# Patient Record
Sex: Male | Born: 1960 | Race: White | Hispanic: No | Marital: Married | Smoking: Current every day smoker
Health system: Southern US, Community
[De-identification: ages and names within clinical notes are randomized; demographics above are authoritative.]

## PROBLEM LIST (undated history)

## (undated) DIAGNOSIS — IMO0002 Reserved for concepts with insufficient information to code with codable children: Secondary | ICD-10-CM

## (undated) DIAGNOSIS — J449 Chronic obstructive pulmonary disease, unspecified: Secondary | ICD-10-CM

## (undated) HISTORY — PX: BACK SURGERY: SHX140

---

## 2002-11-18 ENCOUNTER — Emergency Department (HOSPITAL_COMMUNITY): Admission: EM | Admit: 2002-11-18 | Discharge: 2002-11-18 | Payer: Self-pay | Admitting: Emergency Medicine

## 2005-07-06 ENCOUNTER — Emergency Department: Payer: Self-pay | Admitting: Emergency Medicine

## 2005-11-05 ENCOUNTER — Emergency Department (HOSPITAL_COMMUNITY): Admission: EM | Admit: 2005-11-05 | Discharge: 2005-11-05 | Payer: Self-pay | Admitting: *Deleted

## 2006-04-06 ENCOUNTER — Emergency Department: Payer: Self-pay | Admitting: Emergency Medicine

## 2006-12-29 ENCOUNTER — Emergency Department (HOSPITAL_COMMUNITY): Admission: EM | Admit: 2006-12-29 | Discharge: 2006-12-29 | Payer: Self-pay | Admitting: Emergency Medicine

## 2007-11-04 ENCOUNTER — Encounter: Payer: Self-pay | Admitting: Family Medicine

## 2007-11-04 ENCOUNTER — Emergency Department: Payer: Self-pay | Admitting: Internal Medicine

## 2007-11-09 ENCOUNTER — Emergency Department: Payer: Self-pay | Admitting: Emergency Medicine

## 2007-11-11 ENCOUNTER — Emergency Department: Payer: Self-pay | Admitting: Emergency Medicine

## 2007-11-15 ENCOUNTER — Emergency Department: Payer: Self-pay | Admitting: Emergency Medicine

## 2008-01-05 ENCOUNTER — Emergency Department (HOSPITAL_COMMUNITY): Admission: EM | Admit: 2008-01-05 | Discharge: 2008-01-05 | Payer: Self-pay | Admitting: Emergency Medicine

## 2008-01-18 ENCOUNTER — Emergency Department (HOSPITAL_COMMUNITY): Admission: EM | Admit: 2008-01-18 | Discharge: 2008-01-18 | Payer: Self-pay | Admitting: Emergency Medicine

## 2008-01-24 ENCOUNTER — Emergency Department (HOSPITAL_COMMUNITY): Admission: EM | Admit: 2008-01-24 | Discharge: 2008-01-24 | Payer: Self-pay | Admitting: Emergency Medicine

## 2008-01-27 ENCOUNTER — Emergency Department (HOSPITAL_COMMUNITY): Admission: EM | Admit: 2008-01-27 | Discharge: 2008-01-27 | Payer: Self-pay | Admitting: Emergency Medicine

## 2008-01-28 ENCOUNTER — Ambulatory Visit: Payer: Self-pay | Admitting: Family Medicine

## 2008-01-28 DIAGNOSIS — M1990 Unspecified osteoarthritis, unspecified site: Secondary | ICD-10-CM | POA: Insufficient documentation

## 2008-01-28 DIAGNOSIS — M169 Osteoarthritis of hip, unspecified: Secondary | ICD-10-CM | POA: Insufficient documentation

## 2008-01-28 DIAGNOSIS — M25559 Pain in unspecified hip: Secondary | ICD-10-CM | POA: Insufficient documentation

## 2008-01-28 DIAGNOSIS — M1610 Unilateral primary osteoarthritis, unspecified hip: Secondary | ICD-10-CM | POA: Insufficient documentation

## 2008-02-05 ENCOUNTER — Telehealth (INDEPENDENT_AMBULATORY_CARE_PROVIDER_SITE_OTHER): Payer: Self-pay | Admitting: *Deleted

## 2008-02-05 ENCOUNTER — Emergency Department: Payer: Self-pay | Admitting: Emergency Medicine

## 2008-02-11 ENCOUNTER — Emergency Department: Payer: Self-pay

## 2008-02-11 ENCOUNTER — Emergency Department (HOSPITAL_COMMUNITY): Admission: EM | Admit: 2008-02-11 | Discharge: 2008-02-11 | Payer: Self-pay | Admitting: Emergency Medicine

## 2008-02-11 ENCOUNTER — Telehealth (INDEPENDENT_AMBULATORY_CARE_PROVIDER_SITE_OTHER): Payer: Self-pay | Admitting: *Deleted

## 2008-02-14 ENCOUNTER — Emergency Department (HOSPITAL_COMMUNITY): Admission: EM | Admit: 2008-02-14 | Discharge: 2008-02-14 | Payer: Self-pay | Admitting: Emergency Medicine

## 2008-02-15 ENCOUNTER — Telehealth: Payer: Self-pay | Admitting: Family Medicine

## 2008-02-15 ENCOUNTER — Emergency Department: Payer: Self-pay | Admitting: Emergency Medicine

## 2008-02-22 ENCOUNTER — Emergency Department (HOSPITAL_COMMUNITY): Admission: EM | Admit: 2008-02-22 | Discharge: 2008-02-22 | Payer: Self-pay | Admitting: Emergency Medicine

## 2008-02-29 ENCOUNTER — Emergency Department: Payer: Self-pay | Admitting: Internal Medicine

## 2008-03-21 ENCOUNTER — Emergency Department (HOSPITAL_COMMUNITY): Admission: EM | Admit: 2008-03-21 | Discharge: 2008-03-21 | Payer: Self-pay | Admitting: Emergency Medicine

## 2010-06-14 ENCOUNTER — Ambulatory Visit: Payer: Self-pay | Admitting: Physical Medicine & Rehabilitation

## 2010-06-17 ENCOUNTER — Emergency Department (HOSPITAL_COMMUNITY)
Admission: EM | Admit: 2010-06-17 | Discharge: 2010-06-17 | Disposition: A | Discharge status: Home / Self Care | Payer: Medicaid Other | Attending: Emergency Medicine | Admitting: Emergency Medicine

## 2010-06-17 DIAGNOSIS — M545 Low back pain, unspecified: Secondary | ICD-10-CM | POA: Insufficient documentation

## 2010-06-17 DIAGNOSIS — G8929 Other chronic pain: Secondary | ICD-10-CM | POA: Insufficient documentation

## 2010-06-22 ENCOUNTER — Emergency Department (HOSPITAL_COMMUNITY)
Admission: EM | Admit: 2010-06-22 | Discharge: 2010-06-22 | Disposition: A | Discharge status: Home / Self Care | Payer: Medicaid Other | Attending: Emergency Medicine | Admitting: Emergency Medicine

## 2010-06-22 DIAGNOSIS — M129 Arthropathy, unspecified: Secondary | ICD-10-CM | POA: Insufficient documentation

## 2010-06-22 DIAGNOSIS — M549 Dorsalgia, unspecified: Secondary | ICD-10-CM | POA: Insufficient documentation

## 2010-06-22 DIAGNOSIS — G8929 Other chronic pain: Secondary | ICD-10-CM | POA: Insufficient documentation

## 2010-06-23 ENCOUNTER — Emergency Department (HOSPITAL_COMMUNITY): Admission: EM | Admit: 2010-06-23 | Payer: Self-pay | Source: Home / Self Care

## 2010-07-21 ENCOUNTER — Emergency Department: Payer: Self-pay | Admitting: Emergency Medicine

## 2010-08-04 ENCOUNTER — Ambulatory Visit: Payer: Self-pay | Admitting: Internal Medicine

## 2011-04-24 ENCOUNTER — Encounter (HOSPITAL_COMMUNITY): Payer: Self-pay | Admitting: *Deleted

## 2011-04-24 ENCOUNTER — Emergency Department (HOSPITAL_COMMUNITY)
Admission: EM | Admit: 2011-04-24 | Discharge: 2011-04-24 | Disposition: A | Discharge status: Home / Self Care | Payer: Medicaid Other | Attending: Emergency Medicine | Admitting: Emergency Medicine

## 2011-04-24 DIAGNOSIS — Z760 Encounter for issue of repeat prescription: Secondary | ICD-10-CM | POA: Insufficient documentation

## 2011-04-24 DIAGNOSIS — G8929 Other chronic pain: Secondary | ICD-10-CM | POA: Insufficient documentation

## 2011-04-24 DIAGNOSIS — M549 Dorsalgia, unspecified: Secondary | ICD-10-CM | POA: Insufficient documentation

## 2011-04-24 DIAGNOSIS — F17200 Nicotine dependence, unspecified, uncomplicated: Secondary | ICD-10-CM | POA: Insufficient documentation

## 2011-04-24 HISTORY — DX: Reserved for concepts with insufficient information to code with codable children: IMO0002

## 2011-04-24 MED ORDER — OXYCODONE HCL 20 MG PO TABS: 20.0000 mg | ORAL_TABLET | ORAL | Status: AC | PRN

## 2011-04-24 NOTE — ED Provider Notes (Signed)
History     CSN: [id number]  Arrival date & time 04/24/11  1909   First MD Initiated Contact with Patient 04/24/11 2117      Chief Complaint  Patient presents with  . Medication Refill    (Consider location/radiation/quality/duration/timing/severity/associated sxs/prior treatment) HPI Comments: Patient presents to the emergency department today asking for refill of his 20 mg oxycodone tablets.  Has been taking this for a number of years prescribed by Annitta Needs in [place].  She is currently on her honeymoon and will be returning to the office on January 20.  He does have an appointment for that day to get his routine monthly prescription. Initial database Mr. Hulan Fray has been receiving 200 tablets on a monthly basis for quite some time.  Has 1 provider for his regular prescriptions  The history is provided by the patient.    Past Medical History  Diagnosis Date  . Degenerative disc disease     History reviewed. No pertinent past surgical history.  No family history on file.  History  Substance Use Topics  . Smoking status: Current Everyday Smoker    Types: Cigarettes  . Smokeless tobacco: Not on file  . Alcohol Use: No      Review of Systems  Constitutional: Negative for fever and chills.  Genitourinary: Negative for dysuria.  Musculoskeletal: Positive for back pain.  Neurological: Negative for dizziness, weakness and numbness.    Allergies  Celecoxib; Cyclobenzaprine hcl; and Tramadol  Home Medications   Current Outpatient Rx  Name Route Sig Dispense Refill  . ATENOLOL 25 MG PO TABS Oral Take 25 mg by mouth daily as needed. For high blood pressure    . DIAZEPAM 5 MG PO TABS Oral Take 5 mg by mouth at bedtime as needed. For muscle spasms/sleep    . OXYCODONE HCL ER 20 MG PO TB12 Oral Take 20-30 mg by mouth every 4 (four) hours as needed. For pain    . OXYCODONE HCL 20 MG PO TABS Oral Take 1 tablet (20 mg total) by mouth every 4 (four) hours as needed for  pain (20 milligram tabs 1to 1 and 1/2 tablet every 4 hours ). 30 tablet 0    BP 132/84  Pulse 58  Temp(Src) 98.2 F (36.8 C) (Oral)  Resp 16  SpO2 95%  Physical Exam  Constitutional: He is oriented to person, place, and time. He appears well-developed and well-nourished.  HENT:  Head: Normocephalic.  Neck: Normal range of motion.  Cardiovascular: Normal rate.   Pulmonary/Chest: Effort normal.  Musculoskeletal: Normal range of motion.  Neurological: He is alert and oriented to person, place, and time.  Skin: Skin is warm and dry.  Psychiatric: He has a normal mood and affect.    ED Course  Procedures (including critical care time)  Labs Reviewed - No data to display No results found.   1. Chronic back pain greater than 3 months duration   2. Medication refill       MDM  Chronic back pain, medication refill   Medical screening examination/treatment/procedure(s) were performed by non-physician practitioner and as supervising physician I was immediately available for consultation/collaboration. Osvaldo Human, M.D.      Arman Filter, NP 04/24/11 2134  Arman Filter, NP 04/24/11 2135  Carleene Cooper III, MD 04/26/11 1145

## 2011-04-24 NOTE — ED Notes (Signed)
Pt's MD is out of town and he is out of oxycodone.  He needs refill, he took the last dose today at noon.

## 2011-07-01 ENCOUNTER — Emergency Department: Payer: Self-pay | Admitting: Emergency Medicine

## 2011-07-01 ENCOUNTER — Ambulatory Visit: Payer: Self-pay

## 2011-07-01 ENCOUNTER — Ambulatory Visit (INDEPENDENT_AMBULATORY_CARE_PROVIDER_SITE_OTHER): Payer: Medicare Other | Admitting: Family Medicine

## 2011-07-01 VITALS — BP 175/100 | HR 80 | Temp 98.1°F | Resp 16

## 2011-07-01 DIAGNOSIS — M549 Dorsalgia, unspecified: Secondary | ICD-10-CM

## 2011-07-01 DIAGNOSIS — G8929 Other chronic pain: Secondary | ICD-10-CM

## 2011-07-01 MED ORDER — OXYCODONE HCL 20 MG PO TABS: 20.0000 mg | ORAL_TABLET | ORAL | Status: DC | PRN

## 2011-07-01 NOTE — Progress Notes (Signed)
  Subjective:    Patient ID: Grant Galvan, male    DOB: 1960-06-21, 51 y.o.   MRN: [id number]  HPI 51 yo male here requesting pain medication refill.  Chronic back pain and hip pain.  Was seeing Franchot Gallo but her practice no longer doing pain management.  Recently established with Dr. Mikeal Hawthorne.  Had appt with him this morning.  He had something urgent come up and out of town this week.  Has appt 4/8. Takes oxycodone 20-30 mg q 4 hours as needed.  Given 150 of oxycodone 20's 3/11.  Use fits timeline.  DEA record checked.     Review of Systems Negative except as per HPI     Objective:   Physical Exam  Constitutional: He appears well-developed.  Pulmonary/Chest: Effort normal.  Neurological: He is alert.          Assessment & Plan:  Chronic pain - 8 days of meds given as story all fits with DEA site info.

## 2012-05-05 ENCOUNTER — Emergency Department: Payer: Self-pay | Admitting: Emergency Medicine

## 2013-07-30 ENCOUNTER — Emergency Department: Payer: Self-pay | Admitting: Emergency Medicine

## 2013-09-20 ENCOUNTER — Emergency Department (HOSPITAL_COMMUNITY)
Admission: EM | Admit: 2013-09-20 | Discharge: 2013-09-20 | Disposition: A | Discharge status: Home / Self Care | Payer: PRIVATE HEALTH INSURANCE | Attending: Emergency Medicine | Admitting: Emergency Medicine

## 2013-09-20 ENCOUNTER — Encounter (HOSPITAL_COMMUNITY): Payer: Self-pay | Admitting: Emergency Medicine

## 2013-09-20 DIAGNOSIS — M545 Low back pain, unspecified: Secondary | ICD-10-CM | POA: Insufficient documentation

## 2013-09-20 DIAGNOSIS — Z8739 Personal history of other diseases of the musculoskeletal system and connective tissue: Secondary | ICD-10-CM | POA: Insufficient documentation

## 2013-09-20 DIAGNOSIS — Z760 Encounter for issue of repeat prescription: Secondary | ICD-10-CM

## 2013-09-20 DIAGNOSIS — Z79899 Other long term (current) drug therapy: Secondary | ICD-10-CM | POA: Insufficient documentation

## 2013-09-20 DIAGNOSIS — M549 Dorsalgia, unspecified: Secondary | ICD-10-CM

## 2013-09-20 DIAGNOSIS — F17200 Nicotine dependence, unspecified, uncomplicated: Secondary | ICD-10-CM | POA: Insufficient documentation

## 2013-09-20 DIAGNOSIS — G8929 Other chronic pain: Secondary | ICD-10-CM | POA: Insufficient documentation

## 2013-09-20 MED ORDER — OXYCODONE HCL 20 MG PO TABS: 20.0000 mg | ORAL_TABLET | ORAL | Status: DC | PRN

## 2013-09-20 MED ORDER — OXYCODONE HCL 5 MG PO TABS
20.0000 mg | ORAL_TABLET | Freq: Once | ORAL | Status: AC
  Administered 2013-09-20: 20 mg via ORAL
  Filled 2013-09-20: qty 4

## 2013-09-20 NOTE — ED Notes (Signed)
Pt states that his last dose of medication on Friday. Pt states that his physician is not in the office over the weekend, and was not able to get a refill.

## 2013-09-20 NOTE — ED Notes (Signed)
The pt has chronic back pain and he has been on narcotic pain med for 14 years.  He is out of pain med since Friday and he thinks he is going through withdrawal

## 2013-09-20 NOTE — ED Provider Notes (Signed)
CSN: [id number]     Arrival date & time 09/20/13  0049 History   First MD Initiated Contact with Patient 09/20/13 0151     Chief Complaint  Patient presents with  . Back Pain     HPI Pt was seen at 0215. Per pt, c/o gradual onset and persistence of constant acute flair of his chronic low back "pain" for the past several days.  Denies any change in his usual chronic pain pattern.  Pain worsens with palpation of the area and body position changes. States he ran out of his usual chronic narcotic pain medication 2 days ago and is requesting a refill "to get me through until I can call my regular doctor today." Denies incont/retention of bowel or bladder, no saddle anesthesia, no focal motor weakness, no tingling/numbness in extremities, no fevers, no injury, no abd pain.   The symptoms have been associated with no other complaints. The patient has a significant history of similar symptoms previously, recently being evaluated for this complaint and multiple prior evals for same.     Past Medical History  Diagnosis Date  . Degenerative disc disease    History reviewed. No pertinent past surgical history.  History  Substance Use Topics  . Smoking status: Current Every Day Smoker -- 0.50 packs/day    Types: Cigarettes  . Smokeless tobacco: Not on file  . Alcohol Use: No    Review of Systems ROS: Statement: All systems negative except as marked or noted in the HPI; Constitutional: Negative for fever and chills. ; ; Eyes: Negative for eye pain, redness and discharge. ; ; ENMT: Negative for ear pain, hoarseness, nasal congestion, sinus pressure and sore throat. ; ; Cardiovascular: Negative for chest pain, palpitations, diaphoresis, dyspnea and peripheral edema. ; ; Respiratory: Negative for cough, wheezing and stridor. ; ; Gastrointestinal: Negative for nausea, vomiting, diarrhea, abdominal pain, blood in stool, hematemesis, jaundice and rectal bleeding. . ; ; Genitourinary: Negative for dysuria,  flank pain and hematuria. ; ; Musculoskeletal: +LBP. Negative for neck pain. Negative for swelling and trauma.; ; Skin: Negative for pruritus, rash, abrasions, blisters, bruising and skin lesion.; ; Neuro: Negative for headache, lightheadedness and neck stiffness. Negative for weakness, altered level of consciousness , altered mental status, extremity weakness, paresthesias, involuntary movement, seizure and syncope.        Allergies  Celecoxib; Cyclobenzaprine hcl; and Tramadol  Home Medications   Prior to Admission medications   Medication Sig Start Date End Date Taking? Authorizing Provider  diazepam (VALIUM) 5 MG tablet Take 5 mg by mouth 3 (three) times daily.   Yes Historical Provider, MD  Oxycodone HCl 20 MG TABS Take 1 tablet by mouth every 4 (four) hours as needed (for pain).   Yes Historical Provider, MD   BP 122/88  Pulse 86  Temp(Src) 97.5 F (36.4 C) (Oral)  Resp 22  Ht 6\' 3"  (1.905 m)  Wt 155 lb (70.308 kg)  BMI 19.37 kg/m2  SpO2 99% Physical Exam 0220; Physical examination:  Nursing notes reviewed; Vital signs and O2 SAT reviewed;  Constitutional: Well developed, Well nourished, Well hydrated, In no acute distress; Head:  Normocephalic, atraumatic; Eyes: EOMI, PERRL, No scleral icterus; ENMT: Mouth and pharynx normal, Mucous membranes moist; Neck: Supple, Full range of motion, No lymphadenopathy; Cardiovascular: Regular rate and rhythm, No murmur, rub, or gallop; Respiratory: Breath sounds clear & equal bilaterally, No rales, rhonchi, wheezes.  Speaking full sentences with ease, Normal respiratory effort/excursion; Chest: Nontender, Movement normal; Abdomen: Soft, Nontender,  Nondistended, Normal bowel sounds; Genitourinary: No CVA tenderness; Spine:  No midline CS, TS, LS tenderness. +TTP lumbar paraspinal muscles.;; Extremities: Pulses normal, No tenderness, No edema, No calf edema or asymmetry.; Neuro: AA&Ox3, Major CN grossly intact.  Speech clear. No gross focal motor  deficits in extremities.; Skin: Color normal, Warm, Dry.   ED Course  Procedures     MDM  MDM Reviewed: previous chart, nursing note and vitals    0300:  Long hx of chronic pain with multiple ED visits for same.  Pt endorses acute flair of his usual long standing chronic pain today, no change from his usual chronic pain pattern.  Pt encouraged to f/u with his PMD and Pain Management doctor for good continuity of care and control of his chronic pain.  Verb understanding.     Laray AngerKathleen M McManus, DO 09/22/13 1739

## 2013-09-20 NOTE — Discharge Instructions (Signed)
°Emergency Department Resource Guide °1) Find a Doctor and Pay Out of Pocket °Although you won't have to find out who is covered by your insurance plan, it is a good idea to ask around and get recommendations. You will then need to call the office and see if the doctor you have chosen will accept you as a new patient and what types of options they offer for patients who are self-pay. Some doctors offer discounts or will set up payment plans for their patients who do not have insurance, but you will need to ask so you aren't surprised when you get to your appointment. ° °2) Contact Your Local Health Department °Not all health departments have doctors that can see patients for sick visits, but many do, so it is worth a call to see if yours does. If you don't know where your local health department is, you can check in your phone book. The CDC also has a tool to help you locate your state's health department, and many state websites also have listings of all of their local health departments. ° °3) Find a Walk-in Clinic °If your illness is not likely to be very severe or complicated, you may want to try a walk in clinic. These are popping up all over the country in pharmacies, drugstores, and shopping centers. They're usually staffed by nurse practitioners or physician assistants that have been trained to treat common illnesses and complaints. They're usually fairly quick and inexpensive. However, if you have serious medical issues or chronic medical problems, these are probably not your best option. ° °No Primary Care Doctor: °- Call Health Connect at  832-8000 - they can help you locate a primary care doctor that  accepts your insurance, provides certain services, etc. °- Physician Referral Service- 1-800-533-3463 ° °Chronic Pain Problems: °Organization         Address  Phone   Notes  °[facility]  (336) [phone] Patients need to be referred by their primary care doctor.  ° °Medication  Assistance: °Organization         Address  Phone   Notes  °[place] [facility] [address] °[place], [place] [address] (336) 641-8030 --Must be a resident of [place] °-- Must have NO insurance coverage whatsoever (no Medicaid/ Medicare, etc.) °-- The pt. MUST have a primary care doctor that directs their care regularly and follows them in the community °  °[facility]  (866) 331-1348   °[employer]  (888) 892-1162   ° °Agencies that provide inexpensive medical care: °Organization         Address  Phone   Notes  °[facility]  (336) 832-8035   °[facility]    (336) 832-7272   °[facility] [address] °[place], [place] [address] (336) 832-4777   °[facility] [address], °[place] (336) 271-4999   °[facility]    (336) 373-0678   °[facility]    (336) 272-1050   °[facility] ° [address], [place] Phone:  (336) 832-4444, Fax:  (336) 832-4440 Hours of Operation:  9 am - 6 pm, M-F.  Also accepts Medicaid/Medicare and self-pay.  °[facility] ° [address], [place] Phone: (336) 832-3150, Fax: (336) [phone]. Hours of Operation:  8:30 am - 5:30 pm, M-F.  Also accepts Medicaid and self-pay.  °[facility] [place] [address], [place] Phone: (336) 878-6027   °[facility] [address], [place], [place] (336)723-1848, Ext. 123 Mondays & Thursdays: 7-9 AM.  First 15 patients are seen on a first come, first serve basis. °  ° °Medicaid-accepting [place] Providers: ° °Organization         Address  Phone   Notes  °[facility] [address], [place] (336) 641-2100 Also accepts self-pay patients.  °[facility] [address], [place] ° (336) 856-9996   °[facility] [address], [place]  (336) 288-8857   °[facility] [address] [place] [address], [place] (336) 299-7000   °Veita Bland [address], [place]  ° (336) 373-1557 Only accepts [place] Access Medicaid patients after they have their name applied to their card.  ° °Self-Pay (no insurance) in [place]: ° °Organization         Address  Phone   Notes  °Sickle Cell Patients, [facility] [address], [place] (336) 832-1970   °[facility] [address], [place] (336) 832-4400   °[facility] ° [address], [place] (336) 992-4800   °[facility]/Dr. Osei-Bonsu ° [address] [place] [address], [place] or [address], [place] (336) [phone] Phone number for both [place] and [place] locations is the same.  °[facility] [address], [place] (336) 299-0000   °[facility] [address] [place] [address], [place] or [address] (336) 852-7530 °(336) 878-2260   °[facility] [address], [place] (336) 350-1642, phone; (336) 294-5005, fax Sees patients 1st and 3rd Saturday of every month.  Must not qualify for public or private insurance (i.e. Medicaid, Medicare, [place] Health Choice, Veterans' Benefits) • Household income should be no more than 200% of the poverty level •The clinic cannot treat you if you are pregnant or think you are pregnant • Sexually transmitted diseases are not treated at the clinic.  ° ° °Dental Care: °Organization         Address  Phone  Notes  °[place] [facility] [address], [place] (336) 641-6152 Accepts children up to age 21 who are enrolled in Medicaid or [place] Health Choice; pregnant women with a Medicaid card; and children who have applied for Medicaid or [place] Health Choice, but were declined, whose parents can pay a reduced fee at time of service.  °[place]  [facility] [place]  [address], [place] (336) 641-7733 Accepts children up to age 21 who are enrolled in Medicaid or [place] Health Choice; pregnant women with a Medicaid card; and children who have applied for Medicaid or [place] Health Choice, but were declined, whose parents can pay a reduced fee at time of service.  °[facility] ° [address], [place] (336) 641-4533 Patients are seen by appointment only. Walk-ins are not accepted. [facility] will see patients 18 years of age and older. °Monday - Tuesday (8am-5pm) °Most Wednesdays (8:30-5pm) °$30 per visit, cash only  °[facility] ° [address], [place] (336) 641-4533 Patients are seen by appointment only. Walk-ins are not accepted. [facility] will see patients 18 years of age and older. °One   Wednesday Evening (Monthly: Volunteer Based).  $30 per visit, cash only  °[facility]  (919) 537-3737 for adults; Children under age 4, call Graduate Pediatric Dentistry at (919) [phone]. Children aged 4-14, please call (919) [phone] to request a pediatric application. ° Dental services are provided in all areas of dental care including fillings, crowns and bridges, complete and partial dentures, implants, gum treatment, root canals, and extractions. Preventive care is also provided. Treatment is provided to both adults and children. °Patients are selected via a lottery and there is often a waiting list. °  °[facility] [address], °[place] ° (336) 763-8833 [email or web address] °  °[facility] [address], [place], [place] (336)723-1848, Ext. 123 Second and Fourth Thursday of each month, opens at 6:30 AM; Clinic ends at 9 AM.  Patients are seen on a first-come first-served basis, and a limited number are seen during each clinic.  ° °[facility] ° [address], [place], [place] (336) 723-7904    Eligibility Requirements °You must have lived in [place], [place], or [place] counties for at least the last three months. °  You cannot be eligible for state or federal sponsored healthcare insurance, including Veterans Administration, Medicaid, or Medicare. °  You generally cannot be eligible for healthcare insurance through your employer.  °  How to apply: °Eligibility screenings are held every Tuesday and Wednesday afternoon from 1:00 pm until 4:00 pm. You do not need an appointment for the interview!  °[facility] [address], [place], [place] 336-631-2330   °[facility]  336-342-8273   °[facility]  336-703-3100   °[facility]  336-570-6415   ° °Behavioral Health Resources in the Community: °Intensive Outpatient Programs °Organization         Address  Phone  Notes  °[facility] [address], [place], [place] 336-878-6098   °[facility] [address], [place], [place] 336-832-9800   °[facility] [address], [place], [place] ° 336-882-2125   °[facility] [address],  °[place], [place] 1-800-853-5163 or 336-641-4981   °Substance Abuse Resources °Organization         Address  Phone  Notes  °[facility]  336-882-2125   °[facility]  336-784-9470   °[facility]  336-285-9073   °[facility]  336-845-3988   °Residential & Outpatient Substance Abuse Program  1-800-659-3381   °Psychological Services °Organization         Address  Phone  Notes  °[facility]  336- 832-9600   °[facility]  336- 378-7881   °[facility] [address], [place] 1-800-853-5163 or 336-641-4981   ° °Mobile Crisis Teams °Organization         Address  Phone  Notes  °[facility], Mobile Crisis Care Unit  1-877-626-1772   °Assertive °[facility] ° [address]  [place], [place] 336-834-9664   °Sharon DeEsch [address] °[place] 336-554-5454   ° °Self-Help/Support Groups °Organization         Address  Phone             Notes  °[facility] of  - variety of support groups  336- 373-1402 Call for more information  °Narcotics Anonymous (NA), [facility] [address], °[place]  2 meetings at this location  ° °  Residential Treatment Programs Organization         Address  Phone  Notes  [facility] [address],    [place]  [phone]   [facility]  [address], [place] [id number], [place], [place] [phone]   [facility] [address], [place] [phone]) [phone] Admissions: 8am-3pm M-F  [facility] [address]    [place], [place] [phone]   [facility] [address], [place], [place] [phone]   [facility] [address]  [place], [place] [phone]   [facility] Intensive Outpatient [address], [place], [place] [phone]   [facility] ([facility] [address]  [place], [place] [phone] or (386)[phone](201)[phone]   [facility] ([facility]) [address] [place], [place] [phone] Accepts Medicaid  [facility] [address]  [place] [phone] Substance Abuse/Addiction Treatment   [facility] Organization         Address  Phone  Notes  [facility]  [phone](888) 229-790-9692   Angie FavaJulie Brannon, PhD [address], [address] [place], [place]   [phone](336) [phone] or (702)[phone](336) [phone]   [facility]   [address] [place], [place] [phone](336) [phone]   [facility] [address], [place], [place] [phone](336) [phone] Insurance/Medicaid/sponsorship through [facility]  [facility] [address]                                    [place], [place] [phone](336) [phone] Therapy/tele-psych/case    [facility] [address]   [place], [place] [phone](336) [phone]    Dr. Lolly MustacheArfeen  [phone](336) [phone]   [facility]  [facility] 1) [address], [place] 2) [address], [place] 3)  [address], [place] (681)[phone](336) 443-026-2738 [phone](336) [phone]  [phone](336) [phone]   [facility] Child Abuse Hot[facility] 367-777-1293(336) [phone] or (706)[phone](336) [phone] (After Hours)       Take the prescription as directed.  Call your regular medical doctor today to schedule a follow up appointment within the next 24 hours to refill your chronic pain medication.  Return to the Emergency Department immediately sooner if worsening.

## 2013-10-18 ENCOUNTER — Ambulatory Visit (INDEPENDENT_AMBULATORY_CARE_PROVIDER_SITE_OTHER): Payer: Medicare Other | Admitting: Physician Assistant

## 2013-10-18 VITALS — BP 126/84 | HR 70 | Temp 98.5°F | Resp 18 | Ht 75.5 in | Wt 166.4 lb

## 2013-10-18 DIAGNOSIS — I10 Essential (primary) hypertension: Secondary | ICD-10-CM | POA: Insufficient documentation

## 2013-10-18 DIAGNOSIS — M1990 Unspecified osteoarthritis, unspecified site: Secondary | ICD-10-CM

## 2013-10-18 DIAGNOSIS — Z8679 Personal history of other diseases of the circulatory system: Secondary | ICD-10-CM | POA: Insufficient documentation

## 2013-10-18 MED ORDER — OXYCODONE HCL 20 MG PO TABS: 30.0000 mg | ORAL_TABLET | ORAL | Status: DC | PRN

## 2013-10-18 NOTE — Patient Instructions (Signed)
Proceed with the appointment with Ms. Sampson SiBaity on 8/04 as planned. In the meantime, work on getting the records from your other imaging studies so that we can update your record.
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
## 2013-11-02 ENCOUNTER — Ambulatory Visit (INDEPENDENT_AMBULATORY_CARE_PROVIDER_SITE_OTHER): Payer: Medicare Other | Admitting: Internal Medicine

## 2013-11-02 ENCOUNTER — Ambulatory Visit: Payer: PRIVATE HEALTH INSURANCE | Admitting: Internal Medicine

## 2013-11-02 VITALS — BP 142/96 | HR 78 | Temp 98.0°F | Resp 18 | Ht 75.0 in | Wt 160.0 lb

## 2013-11-02 DIAGNOSIS — Z7189 Other specified counseling: Secondary | ICD-10-CM

## 2013-11-02 DIAGNOSIS — IMO0002 Reserved for concepts with insufficient information to code with codable children: Secondary | ICD-10-CM | POA: Insufficient documentation

## 2013-11-02 DIAGNOSIS — G894 Chronic pain syndrome: Secondary | ICD-10-CM

## 2013-11-02 DIAGNOSIS — R52 Pain, unspecified: Secondary | ICD-10-CM

## 2013-11-02 DIAGNOSIS — Z5189 Encounter for other specified aftercare: Secondary | ICD-10-CM

## 2013-11-02 MED ORDER — OXYCODONE HCL 20 MG PO TABS: 30.0000 mg | ORAL_TABLET | ORAL | Status: DC | PRN

## 2013-11-02 MED ORDER — DIAZEPAM 5 MG PO TABS: 5.0000 mg | ORAL_TABLET | Freq: Three times a day (TID) | ORAL | Status: DC

## 2013-11-02 NOTE — Patient Instructions (Signed)
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
## 2013-11-15 ENCOUNTER — Ambulatory Visit (INDEPENDENT_AMBULATORY_CARE_PROVIDER_SITE_OTHER): Payer: Medicare Other | Admitting: Family Medicine

## 2013-11-15 VITALS — BP 140/88 | HR 64 | Temp 98.2°F | Resp 16 | Ht 75.0 in | Wt 158.8 lb

## 2013-11-15 DIAGNOSIS — G894 Chronic pain syndrome: Secondary | ICD-10-CM

## 2013-11-15 DIAGNOSIS — Z7189 Other specified counseling: Secondary | ICD-10-CM

## 2013-11-15 DIAGNOSIS — IMO0002 Reserved for concepts with insufficient information to code with codable children: Secondary | ICD-10-CM

## 2013-11-15 MED ORDER — OXYCODONE HCL 20 MG PO TABS: 30.0000 mg | ORAL_TABLET | ORAL | Status: DC | PRN

## 2013-11-15 NOTE — Progress Notes (Signed)
[facility] [address], [place] [address] [phone]  Date:  11/15/2013   Name:  Grant Galvan   DOB:  December 10, 1960   MRN:  [id number]  PCP:  Lonia BloodGARBA,LAWAL, MD    Chief Complaint: Medication Refill   History of Present Illness:  Grant Galvan is a 53 y.o. very pleasant male patient who presents with the following:  He is here today to do a follow-up visit.  He had wanted to see [name], but she is not in until 9am and he has another appt to make. He is going to [place] a month for a [employer] sale and needs to leave soon.  He needs refill of his chronic pain medication prior to his trip. He is in the process or getting in to see pain management.   He takes oxycodone 20 1 or 1.5 every 4 hours as needed.  He may use 8 in a day.  He also uses valium 5mg  2 or 3 a day.   He would like to go ahead and arrange an MRI of his "back and hips" and should be home the 2nd week of September to get this done.   He received oxycodone #144 on 11/02/2013.  He was given an rx for valium with a RF at his last visit with Dr. Perrin MalteseGuest- he still has about 10 left so with the RF this should hold him until he gets home.    He brings with him several radiology reports from [facility]. Most notably MRI from 07/2010 shows "mild multilevel degenerative disc and faced disease, with out canal stenosis or neuroforaminal narrowing.  Mild left paracentral disc protrusion at L4-5 approaches the descending left L5 nerve root."   Patient Active Problem List   Diagnosis Date Noted  . Vertebral fracture 11/02/2013  . HTN (hypertension) 10/18/2013  . OSTEOARTHRITIS 01/28/2008  . OSTEOARTHROS UNSPEC GEN/LOC PELV REGION&THIGH 01/28/2008  . HIP PAIN, RIGHT, CHRONIC 01/28/2008    Past Medical History  Diagnosis Date  . Degenerative disc disease     reports back fx x 3.    History reviewed. No pertinent past surgical history.  History  Substance Use Topics  . Smoking status: Current  Every Day Smoker -- 0.50 packs/day    Types: Cigarettes  . Smokeless tobacco: Never Used     Comment: I've been slowing down  . Alcohol Use: No     Comment: gave up alcohol 1995    Family History  Problem Relation Age of Onset  . COPD Father   . Cancer Father 1470    prostate  . Cancer Brother 1157    colon    Allergies  Allergen Reactions  . Celecoxib Other (See Comments)    Stopped breathing  . Cyclobenzaprine Hcl Hives  . Tramadol Diarrhea    Medication list has been reviewed and updated.  Current Outpatient Prescriptions on File Prior to Visit  Medication Sig Dispense Refill  . diazepam (VALIUM) 5 MG tablet Take 1 tablet (5 mg total) by mouth 3 (three) times daily.  60 tablet  1  . hydrochlorothiazide (HYDRODIURIL) 25 MG tablet Take 25 mg by mouth daily.      . Oxycodone HCl 20 MG TABS Take 1.5 tablets (30 mg total) by mouth every 4 (four) hours as needed.  144 tablet  0   No current facility-administered medications on file prior to visit.    Review of Systems:  As per HPI- otherwise negative.  Physical Examination: Filed Vitals:   11/15/13 0829  BP: 140/88  Pulse: 64  Temp: 98.2 F (36.8 C)  Resp: 16   Filed Vitals:   11/15/13 0829  Height: 6\' 3"  (1.905 m)  Weight: 158 lb 12.8 oz (72.031 kg)   Body mass index is 19.85 kg/(m^2). Ideal Body Weight: Weight in (lb) to have BMI = 25: 199.6  GEN: WDWN, NAD, Non-toxic, A & O x 3, slim build HEENT: Atraumatic, Normocephalic. Neck supple. No masses, No LAD. Ears and Nose: No external deformity. CV: RRR, No M/G/R. No JVD. No thrill. No extra heart sounds. PULM: CTA B, no wheezes, crackles, rhonchi. No retractions. No resp. distress. No accessory muscle use. ABD: S, NT, ND, +BS. No rebound. No HSM. EXTR: No c/c/e NEURO Normal gait for pt- uses forearm crutches and a back brace  PSYCH: Normally interactive. Conversant. Not depressed or anxious appearing.  Calm demeanor. ;  Assessment and Plan: Chronic pain  syndrome - Plan: Ambulatory referral to Pain Clinic, Oxycodone HCl 20 MG TABS  Vertebral fracture - Plan: Oxycodone HCl 20 MG TABS  Counseling for physician directive - Plan: Oxycodone HCl 20 MG TABS  Filled oxycodone to last for 30 days.  He is ok on valium.  Did referral to [place] pain clinic.   Will order MRI as he requested  Meds ordered this encounter  Medications  . Oxycodone HCl 20 MG TABS    Sig: Take 1.5 tablets (30 mg total) by mouth every 4 (four) hours as needed.    Dispense:  240 tablet    Refill:  0    This is a 30 day supply.     Signed Abbe Amsterdam, MD

## 2013-11-15 NOTE — Patient Instructions (Signed)
We will set up an MRI for after you get back from your trip.  I gave you enough to take 8 oxycodone daily for 30 days.

## 2013-11-16 ENCOUNTER — Other Ambulatory Visit: Payer: Self-pay | Admitting: Family Medicine

## 2013-12-02 ENCOUNTER — Telehealth: Payer: Self-pay | Admitting: Family Medicine

## 2013-12-02 NOTE — Telephone Encounter (Signed)
Called and LMOM.  I am still glad to order the MRI scans.  However he would have to have 3 separate scans ordered- L spine, and each hip.  This may be quite expensive.  Unsure if he would like to look further prior to ordering so many MRI scans.  Please let me know his preference

## 2013-12-06 ENCOUNTER — Telehealth: Payer: Self-pay | Admitting: Family Medicine

## 2013-12-06 DIAGNOSIS — IMO0002 Reserved for concepts with insufficient information to code with codable children: Secondary | ICD-10-CM

## 2013-12-06 DIAGNOSIS — Z7189 Other specified counseling: Secondary | ICD-10-CM

## 2013-12-06 DIAGNOSIS — G894 Chronic pain syndrome: Secondary | ICD-10-CM

## 2013-12-06 MED ORDER — OXYCODONE HCL 20 MG PO TABS: 30.0000 mg | ORAL_TABLET | ORAL | Status: DC | PRN

## 2013-12-06 NOTE — Telephone Encounter (Signed)
He called back to discuss MRI scans.  At this time he would like to delay until he sees pain management, due to cost of scans.  He would like to have his wife pick up his rx for pain medication to bring to him while he is out of town. This is ok, will post- date as appropriate and leave up front for her to pick up  Meds ordered this encounter  Medications  . Oxycodone HCl 20 MG TABS    Sig: Take 1.5 tablets (30 mg total) by mouth every 4 (four) hours as needed. May fill on 12/14/2013    Dispense:  240 tablet    Refill:  0    This is a 30 day supply.

## 2013-12-25 ENCOUNTER — Other Ambulatory Visit: Payer: Self-pay | Admitting: Internal Medicine

## 2013-12-29 NOTE — Telephone Encounter (Signed)
Patient is leaving for [place] this evening, he states he has been out of Valium for 10 days and needs a refill

## 2013-12-30 NOTE — Telephone Encounter (Signed)
I have done for the patient.

## 2013-12-30 NOTE — Telephone Encounter (Signed)
Notified wife on VM and called pt who is OOT to let him know as well. Verified his appt time.

## 2013-12-30 NOTE — Telephone Encounter (Signed)
Pt called to check status. Dr Perrin MalteseGuest has not been in office and no one has addressed this request yet. Pt stated that he has seen Dr Patsy Lageropland who has agreed to be his PCP and has an appt w/her on 01/10/14. She is not in office today either. Can someone else please RF this since pt is flying into town and leaving again this evening?

## 2014-01-10 ENCOUNTER — Encounter: Payer: Self-pay | Admitting: Family Medicine

## 2014-01-10 ENCOUNTER — Ambulatory Visit (INDEPENDENT_AMBULATORY_CARE_PROVIDER_SITE_OTHER): Payer: Medicare Other | Admitting: Family Medicine

## 2014-01-10 VITALS — BP 148/90 | HR 58 | Temp 98.4°F | Resp 16 | Ht 73.0 in | Wt 156.0 lb

## 2014-01-10 DIAGNOSIS — Z7189 Other specified counseling: Secondary | ICD-10-CM

## 2014-01-10 DIAGNOSIS — G894 Chronic pain syndrome: Secondary | ICD-10-CM

## 2014-01-10 DIAGNOSIS — R030 Elevated blood-pressure reading, without diagnosis of hypertension: Secondary | ICD-10-CM

## 2014-01-10 MED ORDER — DIAZEPAM 5 MG PO TABS: ORAL_TABLET | ORAL | Status: DC

## 2014-01-10 MED ORDER — OXYCODONE HCL 20 MG PO TABS: 30.0000 mg | ORAL_TABLET | ORAL | Status: DC | PRN

## 2014-01-10 NOTE — Progress Notes (Signed)
[facility] [address], [place] [address] [phone]  Date:  01/10/2014   Name:  Grant Galvan   DOB:  1961/02/24   MRN:  [id number]  PCP:  Lonia BloodGARBA,LAWAL, MD    Chief Complaint: Medication Refill   History of Present Illness:  Grant Galvan is a 53 y.o. very pleasant male patient who presents with the following:  He is here today to discuss medications.  He needs his oxycodone refilled, and he is also low on his valium.  He will be seeing his new pain doctor hopefully soon- he is to call next month. He plans to see Dr. Delano MetzFrancisco Naveira with [facility] as soon as he gets to his new office He takes valium 3x a day every day, and oxycodone 20mg  8 a day  He needs a toilet safety rail and shower bench, and would like an open hip protector to use for hip support.   He declines a flu shot- "I do not do needles."   BP Readings from Last 3 Encounters:  01/10/14 148/90  11/15/13 140/88  11/02/13 142/96   BP at home 146/63 this am.  He follows this at home- his BP is usually 140s/50-68.  He uses his HCTZ prn if his BP is higher than usual.    Patient Active Problem List   Diagnosis Date Noted  . Vertebral fracture 11/02/2013  . HTN (hypertension) 10/18/2013  . OSTEOARTHRITIS 01/28/2008  . OSTEOARTHROS UNSPEC GEN/LOC PELV REGION&THIGH 01/28/2008  . HIP PAIN, RIGHT, CHRONIC 01/28/2008    Past Medical History  Diagnosis Date  . Degenerative disc disease     reports back fx x 3.    No past surgical history on file.  History  Substance Use Topics  . Smoking status: Current Every Day Smoker -- 0.50 packs/day    Types: Cigarettes  . Smokeless tobacco: Never Used     Comment: I've been slowing down  . Alcohol Use: No     Comment: gave up alcohol 1995    Family History  Problem Relation Age of Onset  . COPD Father   . Cancer Father 3670    prostate  . Cancer Brother 3857    colon    Allergies  Allergen Reactions  .  Celecoxib Other (See Comments)    Stopped breathing  . Cyclobenzaprine Hcl Hives  . Tramadol Diarrhea    Medication list has been reviewed and updated.  Current Outpatient Prescriptions on File Prior to Visit  Medication Sig Dispense Refill  . diazepam (VALIUM) 5 MG tablet TAKE 1 TABLET BY MOUTH 3 TIMES A DAY  60 tablet  0  . Oxycodone HCl 20 MG TABS Take 1.5 tablets (30 mg total) by mouth every 4 (four) hours as needed. May fill on 12/14/2013  240 tablet  0  . hydrochlorothiazide (HYDRODIURIL) 25 MG tablet Take 25 mg by mouth daily.       No current facility-administered medications on file prior to visit.    Review of Systems:  As per HPI- otherwise negative.   Physical Examination: Filed Vitals:   01/10/14 1023  BP: 148/90  Pulse: 58  Temp: 98.4 F (36.9 C)  Resp: 16   Filed Vitals:   01/10/14 1023  Height: 6\' 1"  (1.854 m)  Weight: 156 lb (70.761 kg)   Body mass index is 20.59 kg/(m^2). Ideal Body Weight: Weight in (lb) to have BMI = 25: 189.1  GEN: WDWN, NAD, Non-toxic, A & O  x 3, slim build HEENT: Atraumatic, Normocephalic. Neck supple. No masses, No LAD. Ears and Nose: No external deformity. CV: RRR, No M/G/R. No JVD. No thrill. No extra heart sounds. PULM: CTA B, no wheezes, crackles, rhonchi. No retractions. No resp. distress. No accessory muscle use. ABD: S, NT, ND, +BS. No rebound. No HSM. XTR: No c/c/e NEURO Normal gait for pt, uses elbow crutches as above.   PSYCH: Normally interactive. Conversant. Not depressed or anxious appearing.  Calm demeanor.  Uses bilateral elbow crutches and a back brace  Assessment and Plan: Chronic pain syndrome - Plan: diazepam (VALIUM) 5 MG tablet, Oxycodone HCl 20 MG TABS  Counseling for physician directive  Borderline systolic HTN  States that he has "white coat HTN," and he follows his BP at home, using HCTZ on a prn basis Did refills today for him He will see pain management as soon as he is able to arrange this;  understands that I cannot write for his oxyconde long term  Signed Abbe AmsterdamJessica Jerrie Schussler, MD

## 2014-01-10 NOTE — Patient Instructions (Signed)
Please do go ahead and schedule with your new pain doctor.  Take care, always good to see you

## 2014-02-03 ENCOUNTER — Telehealth: Payer: Self-pay

## 2014-02-03 DIAGNOSIS — G894 Chronic pain syndrome: Secondary | ICD-10-CM

## 2014-02-03 NOTE — Telephone Encounter (Signed)
[facility] - Pt needs a refill on his oxycodone.  He thinks it is due to be filled on Monday and would like for his wife to be able to come by and pick up the prescription before then.  He is out of town in [place] due to his job and will be back on Monday only for a short time.  He then has to leave for [place].  He also wanted you to know that the pain clinic doctor is going into his own private practice and that he has an appointment set with him for January. [phone]

## 2014-02-03 NOTE — Telephone Encounter (Signed)
[place] - patient needs a refill on his oxycodone.  He says he thinks it is due to be filled Monday.  He is out of town in [place] due to his job.  He wants his wife to be able to come pick it up for him some time before Monday.  He will be coming in from his trip Monday only for a short time, then has to leave for [place] the same day.  He also wanted you to know that the pain clinic doctor is going into private practice and he has an appointment with him in January.   [phone]

## 2014-02-04 MED ORDER — OXYCODONE HCL 20 MG PO TABS: 30.0000 mg | ORAL_TABLET | ORAL | Status: DC | PRN

## 2014-02-04 NOTE — Telephone Encounter (Signed)
Did refill and will leave up front for him.  [facility]

## 2014-02-16 ENCOUNTER — Other Ambulatory Visit: Payer: Self-pay | Admitting: Family Medicine

## 2014-03-07 ENCOUNTER — Other Ambulatory Visit: Payer: Self-pay | Admitting: Family Medicine

## 2014-03-07 DIAGNOSIS — G894 Chronic pain syndrome: Secondary | ICD-10-CM

## 2014-03-07 MED ORDER — OXYCODONE HCL 20 MG PO TABS: 30.0000 mg | ORAL_TABLET | ORAL | Status: DC | PRN

## 2014-03-31 ENCOUNTER — Telehealth: Payer: Self-pay

## 2014-03-31 NOTE — Telephone Encounter (Signed)
Error

## 2014-04-04 ENCOUNTER — Encounter: Payer: Self-pay | Admitting: Family Medicine

## 2014-04-04 ENCOUNTER — Ambulatory Visit (INDEPENDENT_AMBULATORY_CARE_PROVIDER_SITE_OTHER): Payer: Medicare Other | Admitting: Family Medicine

## 2014-04-04 VITALS — BP 160/100 | HR 88 | Temp 97.9°F | Resp 16 | Ht 73.5 in | Wt 157.0 lb

## 2014-04-04 DIAGNOSIS — G894 Chronic pain syndrome: Secondary | ICD-10-CM

## 2014-04-04 DIAGNOSIS — I10 Essential (primary) hypertension: Secondary | ICD-10-CM

## 2014-04-04 MED ORDER — DIAZEPAM 5 MG PO TABS: 5.0000 mg | ORAL_TABLET | Freq: Three times a day (TID) | ORAL | Status: DC

## 2014-04-04 MED ORDER — HYDROCHLOROTHIAZIDE 25 MG PO TABS: 25.0000 mg | ORAL_TABLET | Freq: Every day | ORAL | Status: DC

## 2014-04-04 MED ORDER — OXYCODONE HCL 20 MG PO TABS: 30.0000 mg | ORAL_TABLET | ORAL | Status: DC | PRN
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
## 2014-04-04 NOTE — Patient Instructions (Signed)
Good to see you today- best of luck with your new pain doctor.  We should see you for a physical exam and labs at your convenience over the next few months.

## 2014-04-26 ENCOUNTER — Telehealth: Payer: Self-pay

## 2014-04-26 DIAGNOSIS — G894 Chronic pain syndrome: Secondary | ICD-10-CM

## 2014-04-26 NOTE — Telephone Encounter (Signed)
Pt states he is out of town and wants to know if dr copland can refill oxycodone and have his wife to come by and pick up rx

## 2014-04-27 ENCOUNTER — Telehealth: Payer: Self-pay | Admitting: *Deleted

## 2014-04-27 MED ORDER — OXYCODONE HCL 20 MG PO TABS: 30.0000 mg | ORAL_TABLET | ORAL | Status: DC | PRN

## 2014-04-27 NOTE — Telephone Encounter (Signed)
Refill for Oxycodone.  Wants to verify wife Agustin Cree(Darlene Castrogiovanni) can pick up prescription on Monday before leaving town.  Request made by Blanchard KelchWilliam Hayden.

## 2014-04-27 NOTE — Telephone Encounter (Signed)
Did RF for pt, will ask staff to call and let him know and place in pick up box  Meds ordered this encounter  Medications  . Oxycodone HCl 20 MG TABS    Sig: Take 1.5 tablets (30 mg total) by mouth every 4 (four) hours as needed. Ok to fill on 05/02/2014    Dispense:  240 tablet    Refill:  0    This is a 30 day supply.

## 2014-04-28 NOTE — Telephone Encounter (Signed)
Pt has already picked up

## 2014-05-02 ENCOUNTER — Ambulatory Visit: Payer: Medicare Other | Admitting: Family Medicine

## 2014-05-26 ENCOUNTER — Other Ambulatory Visit: Payer: Self-pay | Admitting: Family Medicine

## 2014-05-26 ENCOUNTER — Telehealth: Payer: Self-pay

## 2014-05-26 DIAGNOSIS — G894 Chronic pain syndrome: Secondary | ICD-10-CM

## 2014-05-26 MED ORDER — DIAZEPAM 5 MG PO TABS: 5.0000 mg | ORAL_TABLET | Freq: Three times a day (TID) | ORAL | Status: DC

## 2014-05-26 MED ORDER — OXYCODONE HCL 20 MG PO TABS: 30.0000 mg | ORAL_TABLET | ORAL | Status: DC | PRN

## 2014-05-26 NOTE — Progress Notes (Signed)
Called him and let him know that rx are ready  Meds ordered this encounter  Medications  . diazepam (VALIUM) 5 MG tablet    Sig: Take 1 tablet (5 mg total) by mouth 3 (three) times daily.    Dispense:  90 tablet    Refill:  1    Not to exceed 5 additional fills before 07/09/2014  . Oxycodone HCl 20 MG TABS    Sig: Take 1.5 tablets (30 mg total) by mouth every 4 (four) hours as needed. Ok to fill on 05/30/2014    Dispense:  240 tablet    Refill:  0    This is a 30 day supply.

## 2014-05-26 NOTE — Telephone Encounter (Signed)
Patient is requesting his wife be able to pick up his oxycodone early.  [place] [employer] [place] and will be there through mid march.   [phone]

## 2014-06-22 ENCOUNTER — Telehealth: Payer: Self-pay

## 2014-06-22 DIAGNOSIS — G894 Chronic pain syndrome: Secondary | ICD-10-CM

## 2014-06-22 NOTE — Telephone Encounter (Signed)
Patient is calling to request a refill for oxycodone. His wife will pick up prescription

## 2014-06-23 MED ORDER — OXYCODONE HCL 20 MG PO TABS: 30.0000 mg | ORAL_TABLET | ORAL | Status: DC | PRN

## 2014-06-23 NOTE — Telephone Encounter (Signed)
Did his RF and will put in pick up drawer. LMOM- Asked him to come and see me in the next 2-3 months.  Had to hand- write rx as my computer will not print rx again.

## 2014-07-14 ENCOUNTER — Encounter: Payer: Self-pay | Admitting: *Deleted

## 2014-07-20 ENCOUNTER — Telehealth: Payer: Self-pay

## 2014-07-20 DIAGNOSIS — G894 Chronic pain syndrome: Secondary | ICD-10-CM

## 2014-07-20 NOTE — Telephone Encounter (Signed)
Pt requesting refill on Oxycodone HCl 20 MG TABS, he states that he would like for his wife to pick this up for him this Friday. Please advise

## 2014-07-21 MED ORDER — OXYCODONE HCL 20 MG PO TABS: 30.0000 mg | ORAL_TABLET | ORAL | Status: DC | PRN

## 2014-07-21 NOTE — Telephone Encounter (Signed)
Given last rx to fill on 3/28.  Reviewed controlled substance database and did not find this pt; likely because he is usually having rx filled in another state. Will ask my assistant to call and let him know  Meds ordered this encounter  Medications  . Oxycodone HCl 20 MG TABS    Sig: Take 1.5 tablets (30 mg total) by mouth every 4 (four) hours as needed. Ok to fill on 07/27/2014    Dispense:  240 tablet    Refill:  0    This is a 30 day supply.

## 2014-07-25 ENCOUNTER — Telehealth: Payer: Self-pay | Admitting: *Deleted

## 2014-07-25 NOTE — Telephone Encounter (Signed)
Wife phoned Friday, April 22nd & clarified that Dr. Shanda BumpsJessica Copland is patient's PCP.  Returned her call this morning, per her request, to confirm that PCP has been updated in EMR to reflect Dr. Patsy Lageropland as PCP.

## 2014-08-01 ENCOUNTER — Ambulatory Visit: Payer: Medicare Other | Admitting: Family Medicine

## 2014-08-18 ENCOUNTER — Other Ambulatory Visit: Payer: Self-pay | Admitting: Family Medicine

## 2014-08-19 ENCOUNTER — Telehealth: Payer: Self-pay

## 2014-08-19 DIAGNOSIS — G894 Chronic pain syndrome: Secondary | ICD-10-CM

## 2014-08-19 NOTE — Telephone Encounter (Signed)
Please let him know that this is ok

## 2014-08-19 NOTE — Telephone Encounter (Signed)
Patient of Dr. Patsy Lageropland. He needs a refill on his oxycodone and wants to know if his wife can pick it up on Monday when Dr. Patsy Lageropland is back in the office. Patient is out of town and [place] will [place] in [place] for [place] own appt and wants to come pick it up. He says Dr. Patsy Lageropland has allowed his wife to pick up scripts in the past. Cb# [phone].

## 2014-08-19 NOTE — Telephone Encounter (Signed)
Pt.notified

## 2014-08-22 MED ORDER — OXYCODONE HCL 20 MG PO TABS: 30.0000 mg | ORAL_TABLET | ORAL | Status: DC | PRN

## 2014-08-22 NOTE — Telephone Encounter (Signed)
-----   Message from Pearline CablesJessica C Analucia Hush, MD sent at 08/19/2014  2:44 PM EDT ----- Needs rf

## 2014-08-24 ENCOUNTER — Other Ambulatory Visit: Payer: Self-pay

## 2014-08-24 DIAGNOSIS — G894 Chronic pain syndrome: Secondary | ICD-10-CM

## 2014-08-24 NOTE — Telephone Encounter (Signed)
Patient is calling in regards to medication refill. Although the medication was printed it was no where to be found. Patient states that he got a last minute call to go out of the country to buy cattle and doesn't want to miss the opportunity. He has to be at the airport by 12 today and would like to know if he could the get the oxycodone refill for today's date. Patient states that he is sending his wife here right now. I insisted that they wait for a response and he stated that it will take her an hour to get here. Please call patient! [phone]

## 2014-08-24 NOTE — Telephone Encounter (Signed)
Clarified w/wife that she picked up the Rx on Monday, that is not the problem. Pt just needs to get it filled today, instead of tomorrow as written on orig Rx. I was able to call in authorization (after getting Dr Cyndie Chimeopland's OK) to fill today over the phone and pharm is getting Rx ready for p/up. Advised pt's wife who was in lobby and she thanked us.

## 2014-09-12 ENCOUNTER — Encounter: Payer: Self-pay | Admitting: Family Medicine

## 2014-09-12 ENCOUNTER — Ambulatory Visit (INDEPENDENT_AMBULATORY_CARE_PROVIDER_SITE_OTHER): Payer: Medicare Other | Admitting: Family Medicine

## 2014-09-12 VITALS — BP 144/92 | HR 60 | Temp 98.1°F | Resp 16 | Ht 75.0 in | Wt 154.0 lb

## 2014-09-12 DIAGNOSIS — G894 Chronic pain syndrome: Secondary | ICD-10-CM

## 2014-09-12 DIAGNOSIS — F40298 Other specified phobia: Secondary | ICD-10-CM | POA: Insufficient documentation

## 2014-09-12 MED ORDER — OXYCODONE HCL 20 MG PO TABS: 30.0000 mg | ORAL_TABLET | ORAL | Status: DC | PRN

## 2014-09-12 MED ORDER — DIAZEPAM 5 MG PO TABS: 5.0000 mg | ORAL_TABLET | Freq: Three times a day (TID) | ORAL | Status: DC

## 2014-09-12 NOTE — Patient Instructions (Signed)
Good to see you today!  Please come and see me face to face in 3 months.   We are working on setting up a urine drug screening protocol (which is recommended for all doctors who treat patients with controlled substances).   We can plan to do this for you the next time you are seen, and will do this every 6-12 months.  This will be for EVERYONE and is nothing personal in regards to yourself!
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
## 2014-09-16 ENCOUNTER — Telehealth: Payer: Self-pay

## 2014-09-16 NOTE — Telephone Encounter (Signed)
Patient is calling for Dr. Patsy Lager. He states that his best friend's son is a psychiatric patient and the place in [place] where he currently gets treated isn't working out well. He would like to know what Dr. Patsy Lager would suggest. He states that his best friend's son is borderline autistic. Please call and advise! [phone]

## 2014-09-16 NOTE — Telephone Encounter (Signed)
I would suggest [facility] or the office of Dr. Emerson Monte. Will ask my assistant to call about this issue

## 2014-09-22 ENCOUNTER — Telehealth: Payer: Self-pay

## 2014-09-22 NOTE — Telephone Encounter (Signed)
[facility] called about the same thing. Please advise.

## 2014-09-22 NOTE — Telephone Encounter (Signed)
I gave him an rx for a month's supply of oxycodone to be filled on 6/24 on 6/13- called and [facility] for clarification. He called back and let me know that he needs to fill the rx today instead of tomorrow- he did not lose rx. [facility] and [facility] to RF a day early

## 2014-09-22 NOTE — Telephone Encounter (Signed)
Patient is flying out to [place] today and wants to know if his wife can pick up his oxycodine RX tomorrow morning at 8 am  Called three times this morning.  [phone]

## 2014-10-05 NOTE — Telephone Encounter (Signed)
Error

## 2014-10-13 ENCOUNTER — Telehealth: Payer: Self-pay

## 2014-10-13 DIAGNOSIS — G894 Chronic pain syndrome: Secondary | ICD-10-CM

## 2014-10-13 NOTE — Telephone Encounter (Signed)
Patient is calling because [facility]. They requested that he get his oxycodone printed a week early so his wife can take it to the pharmacy tomorrow. Please call when ready for pick up! [phone]

## 2014-10-14 ENCOUNTER — Other Ambulatory Visit: Payer: Self-pay | Admitting: Family Medicine

## 2014-10-14 DIAGNOSIS — G894 Chronic pain syndrome: Secondary | ICD-10-CM

## 2014-10-14 MED ORDER — OXYCODONE HCL 20 MG PO TABS: 30.0000 mg | ORAL_TABLET | ORAL | Status: DC | PRN

## 2014-10-14 NOTE — Telephone Encounter (Signed)
[facility] and [facility]- will go by the office later on this afternoon and will sign rx for him

## 2014-10-21 ENCOUNTER — Telehealth: Payer: Self-pay

## 2014-10-21 NOTE — Telephone Encounter (Signed)
Pharmacist called to see if we could refill his Oxycodone a day early. Pt is going out of town today and may have to cancel his reservation if he cannot get his medication. Please advise. Call Nash at [phone]. She stated pt never request for early refill.

## 2014-10-21 NOTE — Telephone Encounter (Signed)
Spoke with Angelica Chessman, let her know that patient has been compliant with oxycodone regimen Dr. Patsy Lager has prescribed. Patient is traveling out of town and requesting just one day early. Mandy agreed to refill patient's oxycodone one day early.

## 2014-10-21 NOTE — Telephone Encounter (Signed)
Pt called wanting a refill on Oxycodone HCl 20 MG TABS [[id number]] . If this it gets approved [facility] at [phone]. He is traveling out of town.

## 2014-11-14 ENCOUNTER — Telehealth: Payer: Self-pay

## 2014-11-14 ENCOUNTER — Other Ambulatory Visit: Payer: Self-pay | Admitting: Family Medicine

## 2014-11-14 DIAGNOSIS — G894 Chronic pain syndrome: Secondary | ICD-10-CM

## 2014-11-14 MED ORDER — OXYCODONE HCL 20 MG PO TABS: 30.0000 mg | ORAL_TABLET | ORAL | Status: DC | PRN

## 2014-11-14 NOTE — Telephone Encounter (Signed)
Called and LMOM- I do not feel comfortable going up higher on his dose as he is already on a very high dose of narcotics.  However I will write a RF of his current dose to fill when it is due (too early now).  If he would like me to refer him to a pain specialist I am glad to do so

## 2014-11-14 NOTE — Telephone Encounter (Signed)
Pt needs a refill on his pain meds and to change from taking 1-1 1/2 every four hours to 2 every 4 hours  Best number [phone]

## 2014-12-12 ENCOUNTER — Ambulatory Visit: Payer: Medicare Other | Admitting: Family Medicine

## 2014-12-12 ENCOUNTER — Other Ambulatory Visit: Payer: Self-pay | Admitting: Family Medicine

## 2014-12-14 ENCOUNTER — Other Ambulatory Visit: Payer: Self-pay

## 2014-12-14 ENCOUNTER — Ambulatory Visit: Payer: Medicare Other | Admitting: Family Medicine

## 2014-12-14 DIAGNOSIS — G894 Chronic pain syndrome: Secondary | ICD-10-CM
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
## 2014-12-15 NOTE — Telephone Encounter (Signed)
Called him back and [facility] - sorry for the delay, I received message last night.  I am out of the office today but will have it ready for pick up tomorrow, can't be filled for a few days anyway

## 2014-12-16 MED ORDER — OXYCODONE HCL 20 MG PO TABS: 30.0000 mg | ORAL_TABLET | ORAL | Status: DC | PRN

## 2015-01-11 ENCOUNTER — Encounter: Payer: Self-pay | Admitting: Family Medicine

## 2015-01-11 ENCOUNTER — Ambulatory Visit (INDEPENDENT_AMBULATORY_CARE_PROVIDER_SITE_OTHER): Payer: Medicare Other | Admitting: Family Medicine

## 2015-01-11 VITALS — BP 158/97 | HR 69 | Temp 97.7°F | Resp 16 | Wt 163.0 lb

## 2015-01-11 DIAGNOSIS — Z5181 Encounter for therapeutic drug level monitoring: Secondary | ICD-10-CM

## 2015-01-11 DIAGNOSIS — G894 Chronic pain syndrome: Secondary | ICD-10-CM | POA: Diagnosis not present

## 2015-01-11 MED ORDER — DIAZEPAM 5 MG PO TABS: 5.0000 mg | ORAL_TABLET | Freq: Three times a day (TID) | ORAL | Status: DC

## 2015-01-11 MED ORDER — OXYCODONE HCL 20 MG PO TABS: 30.0000 mg | ORAL_TABLET | ORAL | Status: DC | PRN

## 2015-01-11 NOTE — Patient Instructions (Signed)
It was good to see you today!  Please see me in 3-4 months for a recheck

## 2015-01-11 NOTE — Progress Notes (Signed)
[facility] [address], [place] [address] [phone]  Date:  01/11/2015   Name:  Grant Galvan   DOB:  07-Feb-1961   MRN:  [id number]  PCP:  Abbe Amsterdam, MD    Chief Complaint: Medication Refill   History of Present Illness:  Grant Galvan is a 54 y.o. very pleasant male patient who presents with the following:  History of chronic pain due to injuries and OA mostly in his spine and right hip. We have been treating his chronic pain for over a year now without any concerns as far as compliance.  He is on a large dose of oxycodone but this is stable.   Reviewed controlled substance database which shows rx for 30 days supply of oxycodone dispensed 9/19, and 30 day supply of his valium dispensed on 9/12.  However he reports that he just had valium filled again, and does need more for next month.  Rx for a 30 day supply with one RF on 9/12  He just left [place] last night- he was working in his cattle business.  Drove all night to get to [place] He watches his home BP- generally checks BID.  He may run 153/60-62. He uses the HCTZ when it runs higher at home but reports he has not taken this in months.  He does not wish to treat his BP at its current level Per his ususual habit he declines any immunizations or BW as he cannot tolerate needles He is happy to do a UDS today  Patient Active Problem List   Diagnosis Date Noted  . 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 Vertebral fracture 11/02/2013  . HTN (hypertension) 10/18/2013  . OSTEOARTHRITIS 01/28/2008  . OSTEOARTHROS UNSPEC GEN/LOC PELV REGION&THIGH 01/28/2008  . HIP PAIN, RIGHT, CHRONIC 01/28/2008    Past Medical History  Diagnosis Date  . Degenerative disc disease     reports back fx x 3.    No past surgical history on file.  Social History  Substance Use Topics  . Smoking status: Current Every Day Smoker -- 0.50 packs/day    Types: Cigarettes  . Smokeless tobacco: Never Used     Comment: I've been  slowing down  . Alcohol Use: No     Comment: gave up alcohol 1995    Family History  Problem Relation Age of Onset  . COPD Father   . Cancer Father 32    prostate  . Cancer Brother 32    colon    Allergies  Allergen Reactions  . Celecoxib Other (See Comments)    Stopped breathing  . Cyclobenzaprine Hcl Hives  . Tramadol Diarrhea    Medication list has been reviewed and updated.  Current Outpatient Prescriptions on File Prior to Visit  Medication Sig Dispense Refill  . diazepam (VALIUM) 5 MG tablet TAKE 1 TABLET BY MOUTH 3 TIMES DAILY 90 tablet 1  . hydrochlorothiazide (HYDRODIURIL) 25 MG tablet Take 1 tablet (25 mg total) by mouth daily. 30 tablet 6  . Oxycodone HCl 20 MG TABS Take 1.5 tablets (30 mg total) by mouth every 4 (four) hours as needed. Ok to fill on 12/19/2014 240 tablet 0   No current facility-administered medications on file prior to visit.    Review of Systems:  As per HPI- otherwise negative.   Physical Examination: Filed Vitals:   01/11/15 0834  BP: 158/97  Pulse: 69  Temp: 97.7 F (36.5 C)  Resp: 16   Filed Vitals:  01/11/15 0834  Weight: 163 lb (73.936 kg)   Body mass index is 20.37 kg/(m^2). Ideal Body Weight:    GEN: WDWN, NAD, Non-toxic, A & O x 3, tall, slim build HEENT: Atraumatic, Normocephalic. Neck supple. No masses, No LAD. Ears and Nose: No external deformity. CV: RRR, No M/G/R. No JVD. No thrill. No extra heart sounds. PULM: CTA B, no wheezes, crackles, rhonchi. No retractions. No resp. distress. No accessory muscle use. ABD: S, NT, ND, +BS. No rebound. No HSM. EXTR: No c/c/e NEURO Normal gait for pt- stiff, uses a cane PSYCH: Normally interactive. Conversant. Not depressed or anxious appearing.  Calm demeanor.    Assessment and Plan: Chronic pain syndrome - Plan: Prescript Monitor Profile (9), Oxycodone HCl 20 MG TABS, diazepam (VALIUM) 5 MG tablet  Medication monitoring encounter - Plan: Prescript Monitor Profile  (9)  Paper rx for back support brace for him today, refilled oxycodone and valium for him Await UDS but do not anticipate any problems  Plan follow-up in 3-4 months With labs will encourage him to use his BP medication on a more regular basis  Signed Abbe AmsterdamJessica Copland, MD

## 2015-01-14 ENCOUNTER — Telehealth: Payer: Self-pay

## 2015-01-14 NOTE — Telephone Encounter (Signed)
Pt wants to pick up pain meds on Monday and will not be going out of town because he thought it would be ready for the weekend so he could go out of town.  Please advise [phone]

## 2015-01-14 NOTE — Telephone Encounter (Signed)
Dr. Patsy Lageropland gave him a prescription when he was here on 10/12 to fill on 10/17.  Left him that message.

## 2015-01-14 NOTE — Telephone Encounter (Signed)
Pt is getting ready to leave on a run with his job and will be out of his pain meds on Monday and would like us to call in a refill request  So he can take it with him before he leaves   Best number [phone]

## 2015-01-15 LAB — OXYCODONE, URINE (LC/MS-MS)
NOROXYCODONE, UR: 1228 ng/mL — AB (ref <50)
OXYCODONE, UR: 740 ng/mL — AB (ref <50)
Oxymorphone: 411 ng/mL — AB (ref <50)

## 2015-01-15 LAB — BENZODIAZEPINES (GC/LC/MS), URINE
Alprazolam metabolite (GC/LC/MS), ur confirm: NEGATIVE ng/mL (ref <25)
Clonazepam metabolite (GC/LC/MS), ur confirm: NEGATIVE ng/mL (ref <25)
Flurazepam metabolite (GC/LC/MS), ur confirm: NEGATIVE ng/mL (ref <50)
LORAZEPAMU: NEGATIVE ng/mL (ref <50)
Midazolam (GC/LC/MS), ur confirm: NEGATIVE ng/mL (ref <50)
NORDIAZEPAMU: 307 ng/mL — AB (ref <50)
OXAZEPAMU: 355 ng/mL — AB (ref <50)
TEMAZEPAMU: 497 ng/mL — AB (ref <50)
TRIAZOLAMU: NEGATIVE ng/mL (ref <50)

## 2015-01-17 LAB — PRESCRIPTION MONITORING PROFILE (9 PANEL)
AMPHETAMINE/METH: NEGATIVE ng/mL
BARBITURATE SCREEN, URINE: NEGATIVE ng/mL
CANNABINOID SCRN UR: NEGATIVE ng/mL
COCAINE METABOLITES: NEGATIVE ng/mL
CREATININE, URINE: 21.82 mg/dL (ref >20.0)
METHADONE SCREEN, URINE: NEGATIVE ng/mL
Nitrites, Initial: NEGATIVE ug/mL
Opiate Screen, Urine: NEGATIVE ng/mL
Propoxyphene: NEGATIVE ng/mL
pH, Initial: 7.5 pH (ref 4.5–8.9)

## 2015-02-01 ENCOUNTER — Telehealth: Payer: Self-pay

## 2015-02-01 DIAGNOSIS — G894 Chronic pain syndrome: Secondary | ICD-10-CM

## 2015-02-01 NOTE — Telephone Encounter (Signed)
Pt is needing to get his refill on his pain medication and his wife will pick up the written rx on Friday afternoon

## 2015-02-02 MED ORDER — OXYCODONE HCL 20 MG PO TABS: 30.0000 mg | ORAL_TABLET | ORAL | Status: DC | PRN

## 2015-02-02 NOTE — Telephone Encounter (Signed)
Did RF and will leave up front for him. LMOM

## 2015-02-13 ENCOUNTER — Telehealth: Payer: Self-pay

## 2015-02-13 NOTE — Telephone Encounter (Signed)
[facility] and spoke to [facility]- he is not able to legally fill this until tomorrow (would still be 2 days prior to when he should run out). However he could take rx and have [facility].  Called and LMOM with this information

## 2015-02-13 NOTE — Telephone Encounter (Signed)
Pt needs to know if he can get script. 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 Please advise at [phone]

## 2015-02-13 NOTE — Telephone Encounter (Signed)
Pt states he isn't to get the refill on his HYDROCODONE HCI 20 MG until tomorrow, however he is having to take a load on his truck to [place] today and really would like to have his meds done Today. Please call [phone]    [facility] [address]

## 2015-03-09 ENCOUNTER — Telehealth: Payer: Self-pay

## 2015-03-09 DIAGNOSIS — G894 Chronic pain syndrome: Secondary | ICD-10-CM

## 2015-03-09 NOTE — Telephone Encounter (Signed)
Left voicemail, letting pt know.

## 2015-03-09 NOTE — Telephone Encounter (Signed)
Pt is needing a refill on his oxycodone so his wife can pick it up Monday   Best number [phone]

## 2015-03-09 NOTE — Telephone Encounter (Signed)
Left message on answering machine with note from Dr. Patsy Lageropland.

## 2015-03-09 NOTE — Telephone Encounter (Signed)
Please give him a call- I am out of town but will have this for him on Monday.  [facility]

## 2015-03-13 MED ORDER — OXYCODONE HCL 20 MG PO TABS: 30.0000 mg | ORAL_TABLET | ORAL | Status: DC | PRN

## 2015-03-13 NOTE — Telephone Encounter (Signed)
Did RF today, will leave up front for pick up

## 2015-03-13 NOTE — Telephone Encounter (Signed)
-----   Message from Pearline CablesJessica C Copland, MD sent at 03/09/2015  4:03 PM EST ----- Do RF on Monday- oxycodone

## 2015-03-29 ENCOUNTER — Telehealth: Payer: Self-pay | Admitting: Family Medicine

## 2015-03-29 NOTE — Telephone Encounter (Signed)
Called but not able to reach.  Want to discuss his pain medications and follow-up plan for after I leave [facility]

## 2015-03-31 ENCOUNTER — Telehealth: Payer: Self-pay | Admitting: Family Medicine

## 2015-03-31 DIAGNOSIS — G894 Chronic pain syndrome: Secondary | ICD-10-CM

## 2015-03-31 NOTE — Telephone Encounter (Signed)
Patient is returning phone call. Patient may not answer because [place]. [phone]. Patient request request a refill on pain medication. Wife will pick up medication for him. Oxycodone.

## 2015-04-02 NOTE — Telephone Encounter (Signed)
Called twice today- so far not able to reach will keep trying

## 2015-04-03 MED ORDER — DIAZEPAM 5 MG PO TABS: 5.0000 mg | ORAL_TABLET | Freq: Three times a day (TID) | ORAL | Status: DC

## 2015-04-03 MED ORDER — OXYCODONE HCL 20 MG PO TABS: 30.0000 mg | ORAL_TABLET | ORAL | Status: DC | PRN

## 2015-04-03 NOTE — Telephone Encounter (Signed)
Called and discussed with him.  His wife would like to pick up his next RF of oxycodone this week but it will not be due to fill until next week.  He also needs his valium refilled  Discussed his pain control regimen- I would like to try and taper his dose.  We talked about this- he states that he has been stable and comfortable for the most part on his current regimen for the past 2 years.  He has tried to taper this and his last doctor but him on a combo of oxycodone and dilaudid.  However he was not really able to have pain control on a lower dose and prefers not to take dilaudid.  In general he has not been a good candidate for a pain clinic as he does not want any "knives or needles-" he is not interested in any sort of injection or surgery.   He is on a high dose of pain control, which is not ideal.  However he has been stable on this dose, has been complaint with following up, his recent UDS results were as expected and he has never seemed at all sedated during any of our in person or phone interactions.    Will continue to write his medications with monthly phone checks and approx quarterly in office checks. He has an appt to see me next month  Checked [facility] database- he last filled the diazepam on 12/7 and oxycodone on  Refilled these medications for him today as below  Meds ordered this encounter  Medications  . Oxycodone HCl 20 MG TABS    Sig: Take 1.5 tablets (30 mg total) by mouth every 4 (four) hours as needed. Ok to fill on 04/12/2015    Dispense:  240 tablet    Refill:  0    This is a 30 day supply.  . diazepam (VALIUM) 5 MG tablet    Sig: Take 1 tablet (5 mg total) by mouth 3 (three) times daily. Ok to fill on 04/06/2015    Dispense:  90 tablet    Refill:  1    Not to exceed 4 additional fills before 03/11/2015.

## 2015-04-20 ENCOUNTER — Telehealth: Payer: Self-pay | Admitting: Family Medicine

## 2015-04-20 DIAGNOSIS — G894 Chronic pain syndrome: Secondary | ICD-10-CM

## 2015-04-20 NOTE — Telephone Encounter (Signed)
Wants Dr. Patsy Lager to call him personally on Saturday. He needs a refill on Oxycodone and Valium and schedule his appointment afterwards at [facility]. He wants his wife to pick them up since he will be out of town.  Please advise him  [phone]

## 2015-04-21 ENCOUNTER — Encounter: Payer: Self-pay | Admitting: Family Medicine

## 2015-04-22 MED ORDER — OXYCODONE HCL 20 MG PO TABS: 30.0000 mg | ORAL_TABLET | ORAL | Status: DC | PRN

## 2015-04-22 NOTE — Telephone Encounter (Signed)
Called to discuss with him. He would like to received #2 rx to fill in February and March as I make the transition to my new office.  This is reasonable as it will be difficult for him to get in to be seen right away. He actually had an appt in February that had to be canceled.

## 2015-04-26 ENCOUNTER — Encounter: Payer: Self-pay | Admitting: Family Medicine

## 2015-05-17 ENCOUNTER — Ambulatory Visit: Payer: Medicare Other | Admitting: Family Medicine

## 2015-06-03 ENCOUNTER — Other Ambulatory Visit: Payer: Self-pay | Admitting: Family Medicine

## 2015-06-05 ENCOUNTER — Telehealth: Payer: Self-pay

## 2015-06-05 DIAGNOSIS — G894 Chronic pain syndrome: Secondary | ICD-10-CM

## 2015-06-05 NOTE — Telephone Encounter (Signed)
Pt is checking on the status of his valium because he took his last dose today. He has also so [name] and wanted to know if she could approve this for him  Please advise  [phone]

## 2015-06-06 MED ORDER — DIAZEPAM 5 MG PO TABS: 5.0000 mg | ORAL_TABLET | Freq: Three times a day (TID) | ORAL | Status: DC

## 2015-06-06 NOTE — Telephone Encounter (Signed)
Pt states he have to catch a plane at 12:00 p.m. And really need to have his Valium 5 mg. He have an appt with Dr Patsy Lageropland in April but need us to refill his medicine Please call pt at [phone]    [facility] [address]

## 2015-06-06 NOTE — Telephone Encounter (Signed)
May be too late at this point - I have filled the valium, 1 month rx. He will need to return and establish with new provider in order to get more refills.

## 2015-06-06 NOTE — Telephone Encounter (Signed)
Faxed to local pharm as req'd. Called pt who is probably already traveling, and advised he should be able to get a pharm where he is to transfer the Rx from his local pharm.

## 2015-06-09 NOTE — Telephone Encounter (Signed)
Med filled on 06/06/15.

## 2015-07-03 ENCOUNTER — Encounter: Payer: Self-pay | Admitting: Family Medicine

## 2015-07-03 ENCOUNTER — Ambulatory Visit (INDEPENDENT_AMBULATORY_CARE_PROVIDER_SITE_OTHER): Payer: Medicare Other | Admitting: Family Medicine

## 2015-07-03 VITALS — BP 150/100 | HR 93 | Temp 98.7°F | Ht 75.0 in | Wt 171.2 lb

## 2015-07-03 DIAGNOSIS — F40298 Other specified phobia: Secondary | ICD-10-CM

## 2015-07-03 DIAGNOSIS — I10 Essential (primary) hypertension: Secondary | ICD-10-CM

## 2015-07-03 DIAGNOSIS — G894 Chronic pain syndrome: Secondary | ICD-10-CM

## 2015-07-03 MED ORDER — HYDROCHLOROTHIAZIDE 25 MG PO TABS: 25.0000 mg | ORAL_TABLET | Freq: Every day | ORAL | Status: DC

## 2015-07-03 MED ORDER — DIAZEPAM 5 MG PO TABS: 5.0000 mg | ORAL_TABLET | Freq: Three times a day (TID) | ORAL | Status: DC

## 2015-07-03 MED ORDER — OXYCODONE HCL 20 MG PO TABS: 30.0000 mg | ORAL_TABLET | ORAL | Status: DC | PRN

## 2015-07-03 NOTE — Progress Notes (Signed)
[facility] [place] [address], [address] [place], [place] [address] [phone] [phone]  Date:  07/03/2015   Name:  Grant Galvan   DOB:  1961/03/14   MRN:  [id number]  PCP:  Abbe AmsterdamOPLAND,Melaine Mcphee, MD    Chief Complaint: Follow-up   History of Present Illness:  Grant Galvan is a 55 y.o. very pleasant male patient who presents with the following:  History of chronic pain and muscle spasm due to back injuries Please see my phone note dated 03/31/2015.   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  [place] is traveling to [place] today to [place] a truck and then [place] for a [employer]attle sale.    Reviewed [facility] Last fill of oxycodone 3/7- 30 d supply Last fill of diazepam 3/7- 30 d supply No unexpected entries on [facility]   Last UDS 12/2014, results as expected  He would like to fill his rx today (he is about 3 days early)  as he will be leaving today on a trip today.  He did run out of his HCTZ a couple of weeks ago.  He reports that at home his BP is generally 150/60-65  He endorses a needle phobia and has always refused any blood work and vaccines Patient Active Problem List   Diagnosis Date Noted  . 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 Vertebral fracture 11/02/2013  . HTN (hypertension) 10/18/2013  . OSTEOARTHRITIS 01/28/2008  . OSTEOARTHROS UNSPEC GEN/LOC PELV REGION&THIGH 01/28/2008  . HIP PAIN, RIGHT, CHRONIC 01/28/2008    Past Medical History  Diagnosis Date  . Degenerative disc disease     reports back fx x 3.    No past surgical history on file.  Social History  Substance Use Topics  . Smoking status: Current Every Day Smoker -- 0.50 packs/day    Types: Cigarettes  . Smokeless tobacco: Never Used     Comment: I've been slowing down  . Alcohol Use: No     Comment: gave up alcohol 1995    Family History  Problem Relation Age of Onset  . COPD Father   . Cancer Father 9170    prostate   . Cancer Brother 7457    colon    Allergies  Allergen Reactions  . Celecoxib Other (See Comments)    Stopped breathing  . Cyclobenzaprine Hcl Hives  . Tramadol Diarrhea    Medication list has been reviewed and updated.  Current Outpatient Prescriptions on File Prior to Visit  Medication Sig Dispense Refill  . diazepam (VALIUM) 5 MG tablet Take 1 tablet (5 mg total) by mouth 3 (three) times daily. Ok to fill on 04/06/2015 90 tablet 0  . hydrochlorothiazide (HYDRODIURIL) 25 MG tablet Take 1 tablet (25 mg total) by mouth daily. 30 tablet 6  . Oxycodone HCl 20 MG TABS Take 1.5 tablets (30 mg total) by mouth every 4 (four) hours as needed. Ok to fill on 06/06/2015 240 tablet 0   No current facility-administered medications on file prior to visit.    Review of Systems:  As per HPI- otherwise negative.   Physical Examination: Filed Vitals:   07/03/15 0848  Pulse: 93  Temp: 98.7 F (37.1 C)   Filed Vitals:   07/03/15 0848  Height: 6\' 3"  (1.905 m)  Weight: 171 lb 3.2 oz (77.656 kg)   Body mass index is 21.4 kg/(m^2). Ideal Body Weight: Weight in (lb) to  have BMI = 25: 199.6  GEN: WDWN, NAD, Non-toxic, A & O x 3, looks his usual self, slim build HEENT: Atraumatic, Normocephalic. Neck supple. No masses, No LAD. Ears and Nose: No external deformity. CV: RRR, No M/G/R. No JVD. No thrill. No extra heart sounds. PULM: CTA B, no wheezes, crackles, rhonchi. No retractions. No resp. distress. No accessory muscle use.Marland Kitchen EXTR: No c/c/e NEURO Normal gait for pt, walks stiffly and uses a cane PSYCH: Normally interactive. Conversant. Not depressed or anxious appearing.  Calm demeanor.   Assessment and Plan: Chronic pain syndrome - Plan: diazepam (VALIUM) 5 MG tablet, Oxycodone HCl 20 MG TABS, DISCONTINUED: diazepam (VALIUM) 5 MG tablet, DISCONTINUED: Oxycodone HCl 20 MG TABS  Essential hypertension - Plan: hydrochlorothiazide (HYDRODIURIL) 25 MG tablet  Needle phobia  Refilled his  medications for pain and spasm Refilled his BP medication Recommended that we do labs to monitor his renal function, sugar and cholesterol- he declines today Gave RF for this month  and next- he will see me in 3 months for a recheck   Signed Abbe Amsterdam, MD

## 2015-07-03 NOTE — Patient Instructions (Signed)
Good to see you today!  Please contact me in 2 months for refills and let's plan to visit in about 3 months.   Take care

## 2015-08-02 ENCOUNTER — Other Ambulatory Visit: Payer: Self-pay | Admitting: Emergency Medicine

## 2015-08-02 DIAGNOSIS — G894 Chronic pain syndrome: Secondary | ICD-10-CM

## 2015-08-07 ENCOUNTER — Other Ambulatory Visit: Payer: Self-pay | Admitting: Family Medicine

## 2015-08-07 DIAGNOSIS — G894 Chronic pain syndrome: Secondary | ICD-10-CM

## 2015-08-09 ENCOUNTER — Other Ambulatory Visit: Payer: Self-pay

## 2015-08-09 DIAGNOSIS — G894 Chronic pain syndrome: Secondary | ICD-10-CM

## 2015-08-15 ENCOUNTER — Telehealth: Payer: Self-pay | Admitting: Family Medicine

## 2015-08-15 DIAGNOSIS — G894 Chronic pain syndrome: Secondary | ICD-10-CM

## 2015-08-15 DIAGNOSIS — I10 Essential (primary) hypertension: Secondary | ICD-10-CM

## 2015-08-15 NOTE — Telephone Encounter (Signed)
Can be reached: [phone]  Reason for call: Pt called for refills on oxycodone and valium. He is asking for refills thru July since his Appt is for 10/09/15. Pt wife will p/u Rx. Call when ready.

## 2015-08-16 MED ORDER — OXYCODONE HCL 20 MG PO TABS: 30.0000 mg | ORAL_TABLET | ORAL | Status: DC | PRN

## 2015-08-16 MED ORDER — DIAZEPAM 5 MG PO TABS: 5.0000 mg | ORAL_TABLET | Freq: Three times a day (TID) | ORAL | Status: DC

## 2015-08-16 NOTE — Telephone Encounter (Signed)
Taken care of, can be picked up anytime  Meds ordered this encounter  Medications  . DISCONTD: Oxycodone HCl 20 MG TABS    Sig: Take 1.5 tablets (30 mg total) by mouth every 4 (four) hours as needed. Ok to fill 08/31/2015    Dispense:  240 tablet    Refill:  0    This is a 30 day supply.  Marland Kitchen. DISCONTD: diazepam (VALIUM) 5 MG tablet    Sig: Take 1 tablet (5 mg total) by mouth 3 (three) times daily. Ok to fill 6/1/2-17    Dispense:  90 tablet    Refill:  0    Not to exceed 4 additional fills before 03/11/2015.  Marland Kitchen. Oxycodone HCl 20 MG TABS    Sig: Take 1.5 tablets (30 mg total) by mouth every 4 (four) hours as needed. Ok to fill 09/30/2015    Dispense:  240 tablet    Refill:  0    This is a 30 day supply.  . diazepam (VALIUM) 5 MG tablet    Sig: Take 1 tablet (5 mg total) by mouth 3 (three) times daily. Ok to fill 09/30/15    Dispense:  90 tablet    Refill:  0    Not to exceed 4 additional fills before 03/11/2015.   Called pt and LMOM

## 2015-09-06 ENCOUNTER — Telehealth: Payer: Self-pay | Admitting: Family Medicine

## 2015-09-06 NOTE — Telephone Encounter (Signed)
[facility] [place] - [address] [phone] (Phone) [phone] (Fax)       Faxed over PA for Oxycodone HCl 20 MG TABS faxed received fax forwarded to Clydie BraunKaren CMA at the time she was doing paperwork. Clydie BraunKaren forwarded to Toys 'R' UsJessica C.

## 2015-09-07 ENCOUNTER — Telehealth: Payer: Self-pay | Admitting: Emergency Medicine

## 2015-09-07 NOTE — Telephone Encounter (Signed)
PA submitted on [email or web address] [facility]([employer] [facility] [employer]).  Office notes requested. Informed them that pt has only seen Dr. Patsy Lageropland in [facility] once and all other records will need to be obtained from [facility] [address]. Fax and phone numbers provided for them. OV note from 07/03/15 printed and faxed to [facility] at [phone].  Awaiting determination. JG//CMA

## 2015-09-08 NOTE — Telephone Encounter (Signed)
PA approved for limit exception through 03/31/2016. New cumulative MED limit is 420mg . Approval letter sent for scanning. JG//CMA

## 2015-09-08 NOTE — Telephone Encounter (Signed)
Additional clinical questions received via fax from [facility]. Questions answered and faxed back. Awaiting determination. JG//CMA

## 2015-09-08 NOTE — Telephone Encounter (Signed)
Start of PA is 09/07/15. I faxed [facility] to request that PA start date be changed to 08/31/15 so pt can be reimbursed for refill that he paid out of pocket for. Awaiting response. JG//CMA

## 2015-09-11 NOTE — Telephone Encounter (Signed)
Approval dates changed to 08/31/15-03/31/16 by insurance. Letter sent for scanning. JG//CMA

## 2015-09-27 ENCOUNTER — Telehealth: Payer: Self-pay | Admitting: Family Medicine

## 2015-09-27 NOTE — Telephone Encounter (Signed)
Relation to pt: self Call back number: [phone]  Pharmacy: [facility] [place] [facility] [id number] [place] [facility] [id number]   Reason for call:  Patient is going out of town first thing in the morning and would like PCP to call [facility] [place] [facility] [address] [place] - [id number] and request them to refill Oxycodone HCl 20 MG TABS & diazepam (VALIUM) 5 MG tablet  medication today. Patient states he has enough medication to hold him over until the 1st but because he is going OT he would really appreciate if PCP would approve this. Please advise

## 2015-09-27 NOTE — Telephone Encounter (Signed)
Patient called back to follow up on Rx. States that he will be leaving town tomorrow.

## 2015-09-28 ENCOUNTER — Other Ambulatory Visit: Payer: Self-pay

## 2015-09-28 DIAGNOSIS — G894 Chronic pain syndrome: Secondary | ICD-10-CM

## 2015-09-28 MED ORDER — OXYCODONE HCL 20 MG PO TABS: 30.0000 mg | ORAL_TABLET | ORAL | Status: DC | PRN

## 2015-09-28 NOTE — Telephone Encounter (Addendum)
Called patient. Left message that Rx for Oxycodone is ready to be picked up at front desk,however it will not be able to be filled until 09/30/15. Pharmacy notified. Advised that #90 only will be filled until Dr. Brayton Laymanoplands' return

## 2015-09-28 NOTE — Telephone Encounter (Signed)
Request sent to Dr. Laury AxonLowne for refill.

## 2015-09-28 NOTE — Telephone Encounter (Signed)
Pt called stating was going to travel out of town today but since waiting on his prescription he did not leave, pt stated can wait for his prescription on his due date that would be on 09-30-15. Please advise

## 2015-09-28 NOTE — Telephone Encounter (Signed)
I will ok #90 --- the rest he can get when Dr copeland gets back

## 2015-10-06 NOTE — Telephone Encounter (Signed)
error 

## 2015-10-09 ENCOUNTER — Ambulatory Visit (INDEPENDENT_AMBULATORY_CARE_PROVIDER_SITE_OTHER): Payer: Medicare Other | Admitting: Family Medicine

## 2015-10-09 ENCOUNTER — Encounter: Payer: Self-pay | Admitting: Family Medicine

## 2015-10-09 VITALS — BP 133/79 | HR 55 | Temp 98.0°F | Ht 75.0 in | Wt 169.2 lb

## 2015-10-09 DIAGNOSIS — G894 Chronic pain syndrome: Secondary | ICD-10-CM | POA: Diagnosis not present

## 2015-10-09 DIAGNOSIS — F40298 Other specified phobia: Secondary | ICD-10-CM | POA: Diagnosis not present

## 2015-10-09 DIAGNOSIS — I10 Essential (primary) hypertension: Secondary | ICD-10-CM

## 2015-10-09 MED ORDER — OXYCODONE HCL 20 MG PO TABS: 30.0000 mg | ORAL_TABLET | ORAL | Status: DC | PRN

## 2015-10-09 MED ORDER — DIAZEPAM 5 MG PO TABS: 5.0000 mg | ORAL_TABLET | Freq: Three times a day (TID) | ORAL | Status: DC

## 2015-10-09 NOTE — Progress Notes (Signed)
Pre visit review using our clinic review tool, if applicable. No additional management support is needed unless otherwise documented below in the visit note. 

## 2015-10-09 NOTE — Patient Instructions (Signed)
It was good to see you today- please contact [facility] to get your DOT exam done. This does need to be done by an office that does DOT exams; these are not done at my current office    Take care and please see me in about 3 months for a recheck. Remember that I do encourage you to have routine labs, vaccinations, and a colonoscopy

## 2015-10-09 NOTE — Progress Notes (Signed)
[facility] [address] [place], [place] [address] [phone]  Date:  10/09/2015   Name:  Grant Galvan   DOB:  December 30, 1960   MRN:  [id number]  PCP:  Abbe Amsterdam, MD    Chief Complaint: Follow-up   History of Present Illness:  Grant Galvan is a 55 y.o. very pleasant male patient who presents with the following:  History of chronic pain- here today for a periodic recheck and medication refills.  Last seen here in April of this year Last drug screen in 12/2014, results as expected and available in chart Reviewed [facility]- last rx for diazepam 30 days supply 7/1, last rx for oxycodone 30 day supply 7/1. No unexpected entries. My partner Dr. Laury Axon did write him for a partial till earlier this month, but he did not pick it up. 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 Unfortunately we do not offer this service at my current office.  Directed him to [facility] to have this exam completed   BP Readings from Last 3 Encounters:  10/09/15 133/79  07/03/15 150/100  01/11/15 158/97     Patient Active Problem List   Diagnosis Date Noted  . 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 Vertebral fracture 11/02/2013  . HTN (hypertension) 10/18/2013  . OSTEOARTHRITIS 01/28/2008  . OSTEOARTHROS UNSPEC GEN/LOC PELV REGION&THIGH 01/28/2008  . HIP PAIN, RIGHT, CHRONIC 01/28/2008    Past Medical History  Diagnosis Date  . Degenerative disc disease     reports back fx x 3.    No past surgical history on file.  Social History  Substance Use Topics  . Smoking status: Current Every Day Smoker -- 0.50 packs/day    Types: Cigarettes  . Smokeless tobacco: Never Used     Comment: I've been slowing down  . Alcohol Use: No     Comment: gave up alcohol 1995    Family  History  Problem Relation Age of Onset  . COPD Father   . Cancer Father 41    prostate  . Cancer Brother 38    colon    Allergies  Allergen Reactions  . Celecoxib Other (See Comments)    Stopped breathing  . Cyclobenzaprine Hcl Hives  . Tramadol Diarrhea    Medication list has been reviewed and updated.  Current Outpatient Prescriptions on File Prior to Visit  Medication Sig Dispense Refill  . diazepam (VALIUM) 5 MG tablet Take 1 tablet (5 mg total) by mouth 3 (three) times daily. Ok to fill 09/30/15 90 tablet 0  . hydrochlorothiazide (HYDRODIURIL) 25 MG tablet Take 1 tablet (25 mg total) by mouth daily. 90 tablet 3  . Oxycodone HCl 20 MG TABS Take 1.5 tablets (30 mg total) by mouth every 4 (four) hours as needed. Ok to fill 09/30/2015 90 tablet 0   No current facility-administered medications on file prior to visit.    Review of Systems:  As per HPI- otherwise negative.   Physical Examination: Filed Vitals:   10/09/15 0912  BP: 133/79  Pulse: 55  Temp: 98 F (36.7 C)   Filed Vitals:   10/09/15 0912  Height:  (1.905 m)  Weight: 169 lb 3.2 oz (76.749 kg)   Body mass index is 21.15 kg/(m^2).  Ideal Body Weight: Weight in (lb) to have BMI = 25: 199.6  GEN: WDWN, NAD, Non-toxic, A & O x 3, slim build HEENT: Atraumatic, Normocephalic. Neck supple. No masses, No LAD. Ears and Nose: No external deformity. CV: RRR, No M/G/R. No JVD. No thrill. No extra heart sounds. PULM: CTA B, no wheezes, crackles, rhonchi. No retractions. No resp. distress. No accessory muscle use. EXTR: No c/c/e NEURO Normal gait for pt- moves stiffly and uses cane  PSYCH: Normally interactive. Conversant. Not depressed or anxious appearing.  Calm demeanor.    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

## 2015-12-21 ENCOUNTER — Telehealth: Payer: Self-pay | Admitting: Family Medicine
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
## 2015-12-22 NOTE — Telephone Encounter (Signed)
Called pharmacy and took care of this for him. Called pt and let him know he can pick up his meds 2 days early on 9/25

## 2015-12-22 NOTE — Telephone Encounter (Signed)
Pt called in to follow up on request. I informed him that previous message was sent to PCP. He express understanding.    Pt would like to speak with provider directly to explain if possible.  CB: [phone]   [facility] is where he would like to have Rx sent to   Pt says that they will not fill out of state. He will be out of the state away for work and will be running out of meds on Wednesday.

## 2016-01-01 ENCOUNTER — Ambulatory Visit: Payer: Medicare Other | Admitting: Family Medicine

## 2016-01-09 ENCOUNTER — Other Ambulatory Visit: Payer: Self-pay | Admitting: Family Medicine

## 2016-01-09 DIAGNOSIS — G894 Chronic pain syndrome: Secondary | ICD-10-CM

## 2016-01-09 DIAGNOSIS — I10 Essential (primary) hypertension: Secondary | ICD-10-CM

## 2016-01-09 NOTE — Telephone Encounter (Signed)
Oxycodone HCl 20 MG TABS  diazepam (VALIUM) 5 MG tablet  hydrochlorothiazide (HYDRODIURIL) 25 MG tablet    Patient is requesting refills of the above medications. He will be in town on Friday and would like to pick them up then. Please advise.   Patient relation: self Patient phone: [phone] (can LVM)

## 2016-01-10 MED ORDER — HYDROCHLOROTHIAZIDE 25 MG PO TABS: 25.0000 mg | ORAL_TABLET | Freq: Every day | ORAL | 3 refills | Status: DC

## 2016-01-10 MED ORDER — OXYCODONE HCL 20 MG PO TABS: 30.0000 mg | ORAL_TABLET | ORAL | 0 refills | Status: DC | PRN

## 2016-01-10 MED ORDER — DIAZEPAM 5 MG PO TABS: 5.0000 mg | ORAL_TABLET | Freq: Three times a day (TID) | ORAL | 2 refills | Status: DC

## 2016-01-10 NOTE — Telephone Encounter (Signed)
Called pt and LMOM.  

## 2016-02-01 ENCOUNTER — Ambulatory Visit (INDEPENDENT_AMBULATORY_CARE_PROVIDER_SITE_OTHER): Payer: Medicare Other | Admitting: Family Medicine

## 2016-02-01 ENCOUNTER — Encounter: Payer: Self-pay | Admitting: Family Medicine

## 2016-02-01 DIAGNOSIS — G894 Chronic pain syndrome: Secondary | ICD-10-CM

## 2016-02-01 DIAGNOSIS — Z79891 Long term (current) use of opiate analgesic: Secondary | ICD-10-CM | POA: Diagnosis not present

## 2016-02-01 DIAGNOSIS — Z79899 Other long term (current) drug therapy: Secondary | ICD-10-CM | POA: Diagnosis not present

## 2016-02-01 MED ORDER — OXYCODONE HCL 20 MG PO TABS: 30.0000 mg | ORAL_TABLET | ORAL | 0 refills | Status: DC | PRN

## 2016-02-01 MED ORDER — DIAZEPAM 5 MG PO TABS: 5.0000 mg | ORAL_TABLET | Freq: Three times a day (TID) | ORAL | 2 refills | Status: DC

## 2016-02-01 NOTE — Patient Instructions (Signed)
It was good to see you today- please come and see me in February. It has been a year since your last drug screen- please drop by the lab on the way out today

## 2016-02-01 NOTE — Progress Notes (Signed)
Pre visit review using our clinic review tool, if applicable. No additional management support is needed unless otherwise documented below in the visit note. 
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
## 2016-04-15 ENCOUNTER — Other Ambulatory Visit: Payer: Self-pay | Admitting: Emergency Medicine

## 2016-04-15 ENCOUNTER — Telehealth: Payer: Self-pay | Admitting: Family Medicine

## 2016-04-15 DIAGNOSIS — I10 Essential (primary) hypertension: Secondary | ICD-10-CM

## 2016-04-15 DIAGNOSIS — G894 Chronic pain syndrome: Secondary | ICD-10-CM

## 2016-04-15 MED ORDER — HYDROCHLOROTHIAZIDE 25 MG PO TABS: 25.0000 mg | ORAL_TABLET | Freq: Every day | ORAL | 3 refills | Status: DC

## 2016-04-15 MED ORDER — OXYCODONE HCL 20 MG PO TABS: 30.0000 mg | ORAL_TABLET | ORAL | 0 refills | Status: DC | PRN

## 2016-04-15 MED ORDER — DIAZEPAM 5 MG PO TABS
5.0000 mg | ORAL_TABLET | Freq: Three times a day (TID) | ORAL | 2 refills | Status: DC
Start: 2016-04-15 — End: 2016-07-11

## 2016-04-15 NOTE — Telephone Encounter (Signed)
Patient called back to follow up on request for Refills. States he has to leave town today and needs to have them because he will not be back for 2 weeks

## 2016-04-15 NOTE — Telephone Encounter (Signed)
Called pt. Made him aware of Rx. Update. Pt says that he just picked it up.

## 2016-04-15 NOTE — Telephone Encounter (Signed)
He should actually have rx- I think he is asking for approval to fill one day early.  Called [facility] and gave this approval

## 2016-04-15 NOTE — Telephone Encounter (Signed)
Dr. Patsy Lageropland called pt's pharmacy to authorize 1 day early refill. I sent over refill on HCTZ. Pt informed.

## 2016-04-15 NOTE — Telephone Encounter (Signed)
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  He says that they are due for refill tomorrow but he leaves out today. He says at 3p to be exact.   Please assist  Oxycodone HCl 20 MG, hydrochlorothiazide, diazepam

## 2016-04-29 ENCOUNTER — Telehealth: Payer: Self-pay | Admitting: Family Medicine

## 2016-04-29 NOTE — Telephone Encounter (Signed)
Spoke with patient. He stated that he will not be able to schedule an awv at this time due to his work schedule.

## 2016-05-06 ENCOUNTER — Ambulatory Visit (INDEPENDENT_AMBULATORY_CARE_PROVIDER_SITE_OTHER): Payer: Medicare Other | Admitting: Family Medicine

## 2016-05-06 ENCOUNTER — Encounter: Payer: Self-pay | Admitting: Family Medicine

## 2016-05-06 VITALS — BP 151/93 | HR 70 | Temp 97.7°F | Ht 75.0 in | Wt 170.8 lb

## 2016-05-06 DIAGNOSIS — I10 Essential (primary) hypertension: Secondary | ICD-10-CM

## 2016-05-06 DIAGNOSIS — F40298 Other specified phobia: Secondary | ICD-10-CM | POA: Diagnosis not present

## 2016-05-06 DIAGNOSIS — G894 Chronic pain syndrome: Secondary | ICD-10-CM | POA: Diagnosis not present

## 2016-05-06 MED ORDER — OXYCODONE HCL 20 MG PO TABS: 30.0000 mg | ORAL_TABLET | ORAL | 0 refills | Status: DC | PRN

## 2016-05-06 NOTE — Progress Notes (Signed)
[facility] [address], [address] [place], [place] [address] [phone]  Date:  05/06/2016   Name:  Grant Galvan   DOB:  1960-12-23   MRN:  [id number]  PCP:  Abbe Amsterdam, MD    Chief Complaint: Follow-up (Pt here for f/u visit. )   History of Present Illness:  Grant Galvan is a 56 y.o. very pleasant male patient who presents with the following:  Here today for a follow-up visit. 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 Reviewed NCCSR- he last filled a 30 day supply of oxycodone and diazepam on 04/15/16*. No unexpected entries.  No "lost" rx of other concerning behaviors  He takes a high dose of oxycodone- 30 mg every 4 hours for pain, and diazepam 5 TID. Last UDS completed 01-2016.   He is on HCTZ for his HTN He has a needle phobia and has always refused any blood draws or immunizations.  He states that he is taking 2 of his oxycodone sometimes to control his pain. He does not want to see pain management as "they always want to stick me."   He has been traveling a lot recently and may get increased back pain at times However he notes that his back pain is generally under good control with his medication regimen Reviewed NCCSR- nothing unexpected I gave him his valium with 2 RF last month  Indication for chronic opioid: chronic back pain Medication and dose: oxycodone 20, 30 mg every 4 hours prn # pills per month: 240 Last UDS date: November 2017 Pain contract signed (Y/N): yes Date narcotic database last reviewed (include red flags): today, ok  He is on the way back to [place] to pick up a truck and trailer  He and his wife live in [place] with his father who is elderly and in poor health.     Patient Active Problem List   Diagnosis Date Noted  . 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 Vertebral fracture 11/02/2013  . HTN (hypertension) 10/18/2013  . OSTEOARTHRITIS  01/28/2008  . OSTEOARTHROS UNSPEC GEN/LOC PELV REGION&THIGH 01/28/2008  . HIP PAIN, RIGHT, CHRONIC 01/28/2008    Past Medical History:  Diagnosis Date  . Degenerative disc disease    reports back fx x 3.    No past surgical history on file.  Social History  Substance Use Topics  . Smoking status: Current Every Day Smoker    Packs/day: 0.50    Types: Cigarettes  . Smokeless tobacco: Never Used     Comment: I've been slowing down  . Alcohol use No     Comment: gave up alcohol 1995    Family History  Problem Relation Age of Onset  . COPD Father   . Cancer Father 29    prostate  . Cancer Brother 28    colon    Allergies  Allergen Reactions  . Celecoxib Other (See Comments)    Stopped breathing  . Cyclobenzaprine Hcl Hives  . Tramadol Diarrhea    Medication list has been reviewed and updated.  Current Outpatient Prescriptions on File Prior to Visit  Medication Sig Dispense Refill  . diazepam (VALIUM) 5 MG tablet Take 1 tablet (5 mg total) by mouth 3 (three) times daily. 90 tablet 2  . hydrochlorothiazide (HYDRODIURIL) 25 MG tablet Take 1 tablet (25 mg total) by mouth daily. 90 tablet 3  . Oxycodone HCl 20 MG  TABS Take 1.5 tablets (30 mg total) by mouth every 4 (four) hours as needed. To fill on 04/16/2016 240 tablet 0   No current facility-administered medications on file prior to visit.     Review of Systems:  As per HPI- otherwise negative.   Physical Examination: Vitals:   05/06/16 0847  BP: (!) 151/93  Pulse: 70  Temp: 97.7 F (36.5 C)   Vitals:   05/06/16 0847  Weight: 170 lb 12.8 oz (77.5 kg)  Height: 6\' 3"  (1.905 m)   Body mass index is 21.35 kg/m. Ideal Body Weight: Weight in (lb) to have BMI = 25: 199.6  GEN: WDWN, NAD, Non-toxic, A & O x 3, tall, thin build. Tobacco smoker HEENT: Atraumatic, Normocephalic. Neck supple. No masses, No LAD.  Poor dentition  Ears and Nose: No external deformity. CV: RRR, No M/G/R. No JVD. No thrill. No  extra heart sounds. PULM: CTA B, no wheezes, crackles, rhonchi. No retractions. No resp. distress. No accessory muscle use. EXTR: No c/c/e NEURO Normal gait.  PSYCH: Normally interactive. Conversant. Not depressed or anxious appearing.  Calm demeanor.    Assessment and Plan: Essential hypertension  Chronic pain syndrome - Plan: Oxycodone HCl 20 MG TABS, DISCONTINUED: Oxycodone HCl 20 MG TABS, DISCONTINUED: Oxycodone HCl 20 MG TABS  Needle phobia  Here today to discuss his chronic back pain. He notes that his pain is stable and really unchanged. He needs refill of oxycodone today- gave for 3 months.  His UDS is UTD He continues to refuse any blood draws, immunizations or other health maint services BP is under reasonable control generally- he did not take his HCTZ yet today but will do when he gets home   Signed Abbe AmsterdamJessica Dempsey Ahonen, MD

## 2016-05-06 NOTE — Patient Instructions (Signed)
It was good to see you today- take care and please see me in 4-6 months Wash you hands frequently to avoid getting the flu!

## 2016-06-10 ENCOUNTER — Telehealth: Payer: Self-pay | Admitting: Family Medicine

## 2016-06-10 ENCOUNTER — Other Ambulatory Visit: Payer: Self-pay | Admitting: Emergency Medicine

## 2016-06-10 DIAGNOSIS — G894 Chronic pain syndrome: Secondary | ICD-10-CM

## 2016-06-10 NOTE — Telephone Encounter (Signed)
Patient called back to follow up on request for refills. States he has to be on the road by 1 PM. Plse adv

## 2016-06-10 NOTE — Telephone Encounter (Signed)
Dr. Patsy Lageropland spoke with pharmacy.

## 2016-06-10 NOTE — Telephone Encounter (Signed)
I gave him valium with 2 RF on 1/15- should be able to refill now I also refilled his oxycodone at our last visit in February He filled oxycodone on 2/12  Called pt- he actually just needs me to call the pharmacy to approve him to refill today instead on on 3/14 when refill is due.  This is ok with me, called pharmacy for him

## 2016-06-10 NOTE — Telephone Encounter (Signed)
Caller name: Grant Galvan Relationship to patient: Can be reached:[phone] Pharmacy:[facility]  Reason for call:patient going out of town for work, needs meds filled today. Oxycodone 20mg  and Valium 5mg .

## 2016-07-11 ENCOUNTER — Other Ambulatory Visit: Payer: Self-pay | Admitting: Family Medicine

## 2016-07-11 DIAGNOSIS — G894 Chronic pain syndrome: Secondary | ICD-10-CM

## 2016-07-11 MED ORDER — DIAZEPAM 5 MG PO TABS: 5.0000 mg | ORAL_TABLET | Freq: Three times a day (TID) | ORAL | 2 refills | Status: DC

## 2016-07-11 NOTE — Telephone Encounter (Signed)
°  Relation to ZO:XWRU Call back number:[phone] Pharmacy: [facility] - [place], [place] - [address] [phone] (Phone) [phone] (Fax)     Reason for call:  Patient requesting a refill diazepam (VALIUM) 5 MG tablet

## 2016-07-11 NOTE — Telephone Encounter (Signed)
Reviewed NCCSR: last filled his diazepam on 3/12- this was his last RF Will fill for him today

## 2016-08-04 NOTE — Progress Notes (Signed)
[facility] [address], [address] [place], [place] [address] [phone]  Date:  08/05/2016   Name:  Grant Galvan   DOB:  May 25, 1960   MRN:  [id number]  PCP:  Pearline Cables, MD    Chief Complaint: Follow-up (Pt here for 4-6 month follow up. Pt will need refills. )   History of Present Illness:  Grant Galvan is a 56 y.o. very pleasant male patient who presents with the following:  Here today for a periodic recheck for his chronic pain  Indication for chronic opioid: back pain Medication and dose: oxycodone 20, valcium 5 # pills per month: oxycodone 240, valium 90 Last UDS date: 11/17  Pain contract signed (Y/N): needs today (done at [facility] but cannot find in current EMR so will do a new one)  Date narcotic database last reviewed (include red flags): today  He is also on HCTZ for her BP  Grant Galvan has always refused blood draws and immunizations due to his needle phobia.  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 Vertebral fracture 11/02/2013  . HTN (hypertension) 10/18/2013  . OSTEOARTHRITIS 01/28/2008  . OSTEOARTHROS UNSPEC GEN/LOC PELV REGION&THIGH 01/28/2008  . HIP PAIN, RIGHT, CHRONIC 01/28/2008    Past Medical History:  Diagnosis Date  . Degenerative disc disease    reports back fx x 3.    No past surgical history on file.  Social History  Substance Use Topics  . Smoking status: Current Every Day Smoker    Packs/day: 0.50    Types: Cigarettes  . Smokeless tobacco: Never Used     Comment: I've been slowing down  . Alcohol use No     Comment: gave up alcohol  1995    Family History  Problem Relation Age of Onset  . COPD Father   . Cancer Father 23    prostate  . Cancer Brother 64    colon    Allergies  Allergen Reactions  . Celecoxib Other (See Comments)    Stopped breathing  . Cyclobenzaprine Hcl Hives  . Tramadol Diarrhea    Medication list has been reviewed and updated.  Current Outpatient Prescriptions on File Prior to Visit  Medication Sig Dispense Refill  . diazepam (VALIUM) 5 MG tablet Take 1 tablet (5 mg total) by mouth 3 (three) times daily. 90 tablet 2  . hydrochlorothiazide (HYDRODIURIL) 25 MG tablet Take 1 tablet (25 mg total) by mouth daily. 90 tablet 3  . Oxycodone HCl 20 MG TABS Take 1.5 tablets (30 mg total) by mouth every 4 (four) hours as needed. To fill on 07/13/2016 240 tablet 0   No current facility-administered medications on file prior to visit.     Review of Systems:  As per HPI- otherwise negative.   Physical Examination: Vitals:   08/05/16 0922 08/05/16 0927  BP: (!) 160/90 (!) 158/88  Pulse: 87   Temp: 97.7 F (36.5 C)    Vitals:   08/05/16 0922  Weight: 163 lb 9.6 oz (74.2  kg)   Body mass index is 20.45 kg/m. Ideal Body Weight:    GEN: WDWN, NAD, Non-toxic, A & O x 3, tall, thin build HEENT: Atraumatic, Normocephalic. Neck supple. No masses, No LAD. Ears and Nose: No external deformity. CV: RRR, No M/G/R. No JVD. No thrill. No extra heart sounds. PULM: CTA B, no wheezes, crackles, rhonchi. No retractions. No resp. distress. No accessory muscle use. EXTR: No c/c/e NEURO Normal gait for pt- limp, uses a cane PSYCH: Normally interactive. Conversant. Not depressed or anxious appearing.  Calm demeanor.    Assessment and Plan: Chronic pain syndrome - Plan: Oxycodone HCl 20 MG TABS, DISCONTINUED: Oxycodone HCl 20 MG TABS  Refilled his oxycodone for today and for next month.  His UDS is due next month- offered to do today but he is in a hurry.  Will need to do the next time he picks up an  rx Advised that I will need to see him every 3 months according to new law and he states understanding BP is under reasonable control with current medication Did CTS today Signed Abbe AmsterdamJessica Copland, MD

## 2016-08-05 ENCOUNTER — Telehealth: Payer: Self-pay | Admitting: Family Medicine

## 2016-08-05 ENCOUNTER — Ambulatory Visit (INDEPENDENT_AMBULATORY_CARE_PROVIDER_SITE_OTHER): Payer: Medicare Other | Admitting: Family Medicine

## 2016-08-05 DIAGNOSIS — G894 Chronic pain syndrome: Secondary | ICD-10-CM | POA: Diagnosis not present

## 2016-08-05 MED ORDER — OXYCODONE HCL 20 MG PO TABS: 30.0000 mg | ORAL_TABLET | ORAL | 0 refills | Status: DC | PRN

## 2016-08-05 NOTE — Telephone Encounter (Signed)
Please give him a call- I will refill his mediation in July but will need to get a drug screen (as he is coming due in June).  Can give a urine sample when he comes in to pick up his rx, and then we can visit in August

## 2016-08-05 NOTE — Telephone Encounter (Signed)
Relation to ZO:XWRUpt:self Call back number:[phone]   Reason for call:  Patient was seen today and noticed Oxycodone HCl 20 MG TABS Rx was dated for 08/09/16 and 09/09/16 but AVS states return in 3 months (August) and would like to know  will PCP prescribe without office visit in July, please advise if patient needs OV sooner then August

## 2016-08-05 NOTE — Patient Instructions (Addendum)
It was nice to see you today- take care and travel safety!   We will put your controlled substance contract on file today  Please see me in 3 months. We will need to do your drug screen at next visit  Also, I continue to encourage you to have standard labs and immunizations

## 2016-08-07 NOTE — Telephone Encounter (Signed)
Left pt detailed voicemail.

## 2016-09-17 ENCOUNTER — Other Ambulatory Visit: Payer: Self-pay | Admitting: Family Medicine

## 2016-09-17 DIAGNOSIS — G894 Chronic pain syndrome: Secondary | ICD-10-CM

## 2016-09-17 NOTE — Telephone Encounter (Signed)
Caller name: Blanchard KelchWilliam Somarriba Relationship to patient: self Can be reached: [phone]  Reason for call: Pt will be in town 6/28 and 6/29. He would like to pick up RX for valium and oxycodone on 6/29. Pt states he would need UDS. He stated Dr. Patsy Lageropland told him to call and let him know when he would be in town.

## 2016-09-18 MED ORDER — DIAZEPAM 5 MG PO TABS: 5.0000 mg | ORAL_TABLET | Freq: Three times a day (TID) | ORAL | 2 refills | Status: DC

## 2016-09-18 MED ORDER — OXYCODONE HCL 20 MG PO TABS: 30.0000 mg | ORAL_TABLET | ORAL | 0 refills | Status: DC | PRN

## 2016-09-18 NOTE — Telephone Encounter (Signed)
NCCSR:  Last fill of diazepam was on 6/11, and oxycodone 6/11 also  No unexpected or unusual entries  Last visit with me in May- he got an rx for oxycodone for then and for the next month at that visit   Ok to refill, will have him do a UDS at pick up  [facility]

## 2016-09-27 ENCOUNTER — Encounter: Payer: Self-pay | Admitting: Family Medicine

## 2016-09-27 DIAGNOSIS — Z79899 Other long term (current) drug therapy: Secondary | ICD-10-CM | POA: Diagnosis not present

## 2016-09-27 DIAGNOSIS — Z79891 Long term (current) use of opiate analgesic: Secondary | ICD-10-CM | POA: Diagnosis not present
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
## 2016-11-25 ENCOUNTER — Encounter: Payer: Self-pay | Admitting: Family Medicine

## 2016-11-25 ENCOUNTER — Ambulatory Visit (INDEPENDENT_AMBULATORY_CARE_PROVIDER_SITE_OTHER): Payer: Medicare Other | Admitting: Family Medicine

## 2016-11-25 VITALS — BP 170/80 | HR 60 | Temp 98.1°F | Ht 75.0 in | Wt 165.4 lb

## 2016-11-25 DIAGNOSIS — I10 Essential (primary) hypertension: Secondary | ICD-10-CM

## 2016-11-25 DIAGNOSIS — G894 Chronic pain syndrome: Secondary | ICD-10-CM

## 2016-11-25 MED ORDER — OXYCODONE HCL 20 MG PO TABS: 30.0000 mg | ORAL_TABLET | ORAL | 0 refills | Status: DC | PRN

## 2016-11-25 MED ORDER — DIAZEPAM 5 MG PO TABS: 5.0000 mg | ORAL_TABLET | Freq: Three times a day (TID) | ORAL | 2 refills | Status: DC

## 2016-11-25 MED ORDER — AMLODIPINE BESYLATE 10 MG PO TABS: 10.0000 mg | ORAL_TABLET | Freq: Every day | ORAL | 3 refills | Status: DC

## 2016-11-25 NOTE — Patient Instructions (Addendum)
It was a pleasure to see you today as always!  Take care and please see me in about 3 months Let's try amlodipine for your blood pressure instead of the HCTZ- this will not cause urination problems Take a 1/2 amlodipine (5mg ) daily; if your BP is still over 145/90 on 5mg  increase to 10 mg

## 2016-12-18 NOTE — Telephone Encounter (Signed)
Grant Galvan, could you close this encounter please.     Thanks.

## 2016-12-26 ENCOUNTER — Other Ambulatory Visit: Payer: Self-pay | Admitting: Family Medicine

## 2016-12-26 NOTE — Telephone Encounter (Signed)
Relation to ZO:XWRU  Call back number:[phone] Pharmacy: [facility] - [place], [place] - [address] [phone] (Phone) [phone] (Fax)     Reason for call:  Patient requesting a refill Oxycodone HCl 20 MG TABS stating he will be flying out on Monday, patient states PCP is aware he travels, please advise

## 2016-12-26 NOTE — Telephone Encounter (Signed)
Pt called back in to give clarity on previous message. Pt says that Rx for medication is already at pharmacy he would just like for provider to call pharmacy to okay refill for  Monday.

## 2016-12-26 NOTE — Telephone Encounter (Signed)
Called pharmacy and gave them my ok to fill rx on 12/30/16 instead of 10/5 when it is written

## 2017-01-30 ENCOUNTER — Other Ambulatory Visit: Payer: Self-pay | Admitting: Emergency Medicine

## 2017-01-30 DIAGNOSIS — G894 Chronic pain syndrome: Secondary | ICD-10-CM

## 2017-01-30 DIAGNOSIS — I10 Essential (primary) hypertension: Secondary | ICD-10-CM

## 2017-02-13 ENCOUNTER — Ambulatory Visit: Payer: Self-pay | Admitting: Psychology

## 2017-02-19 ENCOUNTER — Telehealth: Payer: Self-pay | Admitting: Family Medicine

## 2017-02-19 DIAGNOSIS — G894 Chronic pain syndrome: Secondary | ICD-10-CM

## 2017-02-19 NOTE — Telephone Encounter (Signed)
Copied from CRM [phone]. Topic: Quick Communication - See Telephone Encounter >> Feb 19, 2017 11:12 AM Oneal GroutSebastian, Jennifer S wrote: CRM for notification. See Telephone encounter for:  Requesting refill on oxycodone 20mg  and valium 5mg , 2 mths worth. Would like to speak to Dr Patsy Lageropland if he can regarding this. Patient had cancel upcoming appt due to travel did reschedule for 05/12/17 02/19/17.

## 2017-02-24 ENCOUNTER — Ambulatory Visit: Payer: Self-pay | Admitting: Family Medicine

## 2017-02-24 MED ORDER — OXYCODONE HCL 20 MG PO TABS: 30.0000 mg | ORAL_TABLET | ORAL | 0 refills | Status: DC | PRN

## 2017-02-24 MED ORDER — DIAZEPAM 5 MG PO TABS: 5.0000 mg | ORAL_TABLET | Freq: Three times a day (TID) | ORAL | 2 refills | Status: DC

## 2017-02-24 NOTE — Telephone Encounter (Signed)
Reviewed NCCSR: Filled his oxycodone on 11/1, but this was 5 days early due to travel Also filled on 10/1, 9/1, 8/7 Filled diazepam on 11/1, 9/1, 8/7   Last visit with me was in August Contract is UTD, UDS done in June

## 2017-04-17 ENCOUNTER — Telehealth: Payer: Self-pay | Admitting: Family Medicine

## 2017-04-17 NOTE — Telephone Encounter (Signed)
Last filled on 12/31, so too early.  Please give them a call- we are now able to Erx his medication so we will not need to worry so much about when he will be able to pick up the paper rx

## 2017-04-17 NOTE — Telephone Encounter (Signed)
Pt is needing refill on oxycodone.

## 2017-04-17 NOTE — Telephone Encounter (Signed)
Copied from CRM [id number]. Topic: Quick Communication - See Telephone Encounter >> Apr 17, 2017  1:36 PM Terisa Starraylor, Brittany L wrote: CRM for notification. See Telephone encounter for:   04/17/17.  Oxycodone HCl 20 MG TABS Please call when ready for pick up. He is out of town [phone] Agustin Cree(Darlene) Wife.

## 2017-04-18 NOTE — Telephone Encounter (Signed)
Called pt to inform of previous message. Spoke with pt's wife Agustin CreeDarlene who verbalized understanding.

## 2017-04-25 ENCOUNTER — Other Ambulatory Visit: Payer: Self-pay | Admitting: Family Medicine

## 2017-04-25 DIAGNOSIS — I10 Essential (primary) hypertension: Secondary | ICD-10-CM

## 2017-04-25 DIAGNOSIS — G894 Chronic pain syndrome: Secondary | ICD-10-CM

## 2017-04-25 MED ORDER — OXYCODONE HCL 20 MG PO TABS: 30.0000 mg | ORAL_TABLET | ORAL | 0 refills | Status: DC | PRN

## 2017-04-25 NOTE — Telephone Encounter (Signed)
Requesting: oxycodone HCI 20 MG TABS Contract: UDS: 09/27/16 uds sample given, low risk next screen 03/29/17. Last OV: 11/25/16 Last Refill: 02/24/17  Please Advise

## 2017-04-25 NOTE — Telephone Encounter (Signed)
UDS done last summer.  Ok to refill, reviewed NCCSR, last fill on 12/31 so likely pharmacy will hold for a few days Called pt and reminded him that he is due for a recheck visit, LMOM

## 2017-05-06 ENCOUNTER — Telehealth: Payer: Self-pay

## 2017-05-06 NOTE — Telephone Encounter (Signed)
PA initiated via Covermymeds; KEY: [id number]. Awaiting determination.

## 2017-05-07 NOTE — Telephone Encounter (Signed)
PA approved until 03/31/2018. However- quantity limit decision still in review.

## 2017-05-11 ENCOUNTER — Encounter: Payer: Self-pay | Admitting: Family Medicine
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
## 2017-05-12 ENCOUNTER — Ambulatory Visit: Payer: Self-pay | Admitting: Family Medicine

## 2017-05-12 NOTE — Telephone Encounter (Signed)
Quantity limit PA for more than 6 tablets per day denied. Coverage for the requested quantity must be prescribed by a pain specialist or pain management consultations.

## 2017-05-14 ENCOUNTER — Encounter: Payer: Self-pay | Admitting: Family Medicine

## 2017-05-14 ENCOUNTER — Telehealth: Payer: Self-pay | Admitting: Family Medicine

## 2017-05-14 DIAGNOSIS — G894 Chronic pain syndrome: Secondary | ICD-10-CM

## 2017-05-14 DIAGNOSIS — I10 Essential (primary) hypertension: Secondary | ICD-10-CM

## 2017-05-14 MED ORDER — DIAZEPAM 5 MG PO TABS: 5.0000 mg | ORAL_TABLET | Freq: Three times a day (TID) | ORAL | 0 refills | Status: DC

## 2017-05-14 MED ORDER — HYDROCHLOROTHIAZIDE 25 MG PO TABS: 25.0000 mg | ORAL_TABLET | Freq: Every day | ORAL | 3 refills | Status: DC

## 2017-05-14 MED ORDER — OXYCODONE HCL 20 MG PO TABS: 30.0000 mg | ORAL_TABLET | ORAL | 0 refills | Status: DC | PRN

## 2017-05-14 NOTE — Telephone Encounter (Signed)
Have discussed with pt, he is seeing me soon for an OV

## 2017-05-14 NOTE — Telephone Encounter (Signed)
Pt calling to check on getting a refill on the medications that he lost yesterday. He states that he has not taken this medication since midnight last night and he is anxious to get the refill.
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
## 2017-05-14 NOTE — Telephone Encounter (Signed)
Patient's wife calling again to get lost medication refills as listed below. Wife stated the patient was agitated and shaking due to missing this morning's doses of valium and oxycodone. Spoke with Selena BattenKim and she is having nurse look at request as soon as possible.

## 2017-05-14 NOTE — Telephone Encounter (Signed)
Copied from CRM [id number]. Topic: Quick Communication - See Telephone Encounter >> May 14, 2017  8:27 AM Guinevere FerrariMorris, Karter Hellmer E, NT wrote: CRM for notification. See Telephone encounter for: Patient's wife called and said that they just came back from out of town and they lost there bags with medication inside.Wife said patient doesn't have any more medication and wanted to know if the doctor could give patient a refill for hydrochlorothiazide (HYDRODIURIL) 25 MG tablet, diazepam (VALIUM) 5 MG tablet and Oxycodone HCl 20 MG TABS. Pt uses [facility] [place] - [address] [phone] (Fax)    05/14/17.

## 2017-05-14 NOTE — Telephone Encounter (Signed)
Please advise 
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
## 2017-05-14 NOTE — Telephone Encounter (Signed)
Pt wife is calling to check on the status of this.

## 2017-05-14 NOTE — Addendum Note (Signed)
Addended by: Abbe AmsterdamOPLAND, Birdia Jaycox C on: 05/14/2017 01:20 PM   Modules accepted: Orders

## 2017-05-19 ENCOUNTER — Encounter: Payer: Self-pay | Admitting: Family Medicine

## 2017-05-19 ENCOUNTER — Ambulatory Visit (INDEPENDENT_AMBULATORY_CARE_PROVIDER_SITE_OTHER): Payer: Medicare Other | Admitting: Family Medicine

## 2017-05-19 VITALS — BP 132/75 | HR 95 | Temp 97.9°F | Resp 16 | Ht 75.0 in | Wt 155.4 lb

## 2017-05-19 DIAGNOSIS — I10 Essential (primary) hypertension: Secondary | ICD-10-CM

## 2017-05-19 DIAGNOSIS — F40298 Other specified phobia: Secondary | ICD-10-CM | POA: Diagnosis not present

## 2017-05-19 DIAGNOSIS — G894 Chronic pain syndrome: Secondary | ICD-10-CM | POA: Diagnosis not present

## 2017-05-19 NOTE — Patient Instructions (Signed)
Decrease your pain medication by a 1/2 tablet per week over the next month- we need to get you down to 6 pills total per day.  Your insurance will only cover 10 pills per 30 days
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
## 2017-06-04 ENCOUNTER — Other Ambulatory Visit: Payer: Self-pay | Admitting: Family Medicine

## 2017-06-04 ENCOUNTER — Encounter: Payer: Self-pay | Admitting: Family Medicine

## 2017-06-04 DIAGNOSIS — G894 Chronic pain syndrome: Secondary | ICD-10-CM

## 2017-06-04 NOTE — Telephone Encounter (Signed)
Requesting: oxycodone and diazepam Contract: 09/27/16 uds sample given UDS: low risk next screen 03/29/17 Last OV : 05/19/17 Last Refill: 05/14/17  Please Advise

## 2017-06-04 NOTE — Telephone Encounter (Signed)
Pt is requesting refill on oxycodone and diazepam.

## 2017-06-04 NOTE — Telephone Encounter (Signed)
Too early to refill.

## 2017-06-05 MED ORDER — OXYCODONE HCL 20 MG PO TABS: 30.0000 mg | ORAL_TABLET | ORAL | 0 refills | Status: DC | PRN

## 2017-06-05 MED ORDER — DIAZEPAM 5 MG PO TABS: 5.0000 mg | ORAL_TABLET | Freq: Three times a day (TID) | ORAL | 0 refills | Status: DC

## 2017-06-06 ENCOUNTER — Other Ambulatory Visit: Payer: Self-pay | Admitting: Family Medicine

## 2017-06-06 DIAGNOSIS — G894 Chronic pain syndrome: Secondary | ICD-10-CM

## 2017-06-06 NOTE — Telephone Encounter (Signed)
Did as was requested,  However when I called [place] pharmacy they reported that the rx were already picked up this am.

## 2017-06-06 NOTE — Telephone Encounter (Signed)
Copied from CRM [id number]. Topic: General - Other >> Jun 06, 2017  1:31 PM Gerrianne ScalePayne, Angela L wrote: Reason for CRM: patient calling stating that [place] and that his RX was sent to the wrong pharmacy they need to [facility] to [facility] please call patient when this is sent over to correct pharmacy

## 2017-06-06 NOTE — Telephone Encounter (Signed)
Valium and oxycodone sent to wrong pharmacy.  Can you res[facility]?  Pharmacies updated.

## 2017-06-16 ENCOUNTER — Encounter: Payer: Self-pay | Admitting: Family Medicine

## 2017-06-16 NOTE — Telephone Encounter (Signed)
Patient is calling and states he trashed his oxycodone and does not want to take them anymore. He is requesting subutex be called in. He states suboxene did not help him once before and he does not wont that. Please advise and contact patient with any questions.  [phone]  [facility] - [place] - [address]  [address] [place] [id number]  Phone: [phone] Fax: [phone]

## 2017-06-16 NOTE — Telephone Encounter (Signed)
Called Grant Galvan back- apparently [place] and is now going through opoid withdrawal.  He asks if I can rx buprenorphine- however this is not something that I am licensed to do. I do wish that he had asked me to help taper his opioids instead of throwing them away, but I am not comfortable re-prescribing opioids now.   He plans to go to the ER for evaluation and to see if there is anything they can do to help.

## 2017-06-18 DIAGNOSIS — M546 Pain in thoracic spine: Secondary | ICD-10-CM | POA: Diagnosis not present

## 2017-06-18 DIAGNOSIS — Z79899 Other long term (current) drug therapy: Secondary | ICD-10-CM | POA: Diagnosis not present

## 2017-06-18 DIAGNOSIS — I10 Essential (primary) hypertension: Secondary | ICD-10-CM | POA: Diagnosis not present

## 2017-06-18 DIAGNOSIS — Z7689 Persons encountering health services in other specified circumstances: Secondary | ICD-10-CM | POA: Diagnosis not present

## 2017-06-25 DIAGNOSIS — Z79899 Other long term (current) drug therapy: Secondary | ICD-10-CM | POA: Diagnosis not present

## 2017-06-25 DIAGNOSIS — M546 Pain in thoracic spine: Secondary | ICD-10-CM | POA: Diagnosis not present

## 2017-06-25 DIAGNOSIS — G8929 Other chronic pain: Secondary | ICD-10-CM | POA: Diagnosis not present

## 2017-06-25 DIAGNOSIS — I10 Essential (primary) hypertension: Secondary | ICD-10-CM | POA: Diagnosis not present

## 2017-07-07 ENCOUNTER — Other Ambulatory Visit: Payer: Self-pay | Admitting: Family Medicine

## 2017-07-07 ENCOUNTER — Encounter: Payer: Self-pay | Admitting: Family Medicine

## 2017-07-07 DIAGNOSIS — G894 Chronic pain syndrome: Secondary | ICD-10-CM

## 2017-07-07 MED ORDER — DIAZEPAM 5 MG PO TABS: 5.0000 mg | ORAL_TABLET | Freq: Three times a day (TID) | ORAL | 0 refills | Status: DC

## 2017-07-07 NOTE — Telephone Encounter (Signed)
Received medication refill for Oxycodone HCl 20 MG TABS.

## 2017-07-07 NOTE — Telephone Encounter (Signed)
Pt requesting refill on diazepam. Please advise.

## 2017-07-07 NOTE — Telephone Encounter (Signed)
Reviewed NCCSR- ok to refill this today, [facility]

## 2017-07-08 ENCOUNTER — Encounter: Payer: Self-pay | Admitting: Family Medicine

## 2017-07-08 ENCOUNTER — Telehealth: Payer: Self-pay | Admitting: Family Medicine

## 2017-07-08 NOTE — Telephone Encounter (Signed)
Copied from CRM [id number]. Topic: Quick Communication - See Telephone Encounter >> Jul 08, 2017 12:20 PM Rudi CocoLathan, Sharnita Bogucki M, NT wrote: CRM for notification. See Telephone encounter for: 07/08/17.  Pt. Calling to let Dr. Patsy Lageropland know that pt. Wants to go back to using his pain med. Oxycodone HCl 20 MG TABS [[id number]][[id number]] Pt. And wife are moving to [place] and seeing if they could get this done by Thursday did let pt. Know about refill policy. Any questions please contact wife at [phone]  [facility] [place] - [address] [place] [address] Phone: [phone] Fax: [phone]

## 2017-07-08 NOTE — Telephone Encounter (Addendum)
4/9- Called him back and talked with his wife since Annette StableBill was asleep; the last I had heard he had thrown away his pain pills. He has been seeing [facility] who has been prescribing suboxone for him since then   Marquita PalmsKevin Edelen FNP [facility] [address] [place] [address] [place], [place] [address] [phone]  They actually have an appt to see Mr. Magnus Ivandelen tomorrow.  Asked them to please inquire about changing back to oxycodone as I am not sure of the procedure here. They will let me know if anything further is needed

## 2017-07-09 DIAGNOSIS — M546 Pain in thoracic spine: Secondary | ICD-10-CM | POA: Diagnosis not present

## 2017-07-09 DIAGNOSIS — Z79899 Other long term (current) drug therapy: Secondary | ICD-10-CM | POA: Diagnosis not present

## 2017-07-09 DIAGNOSIS — G8929 Other chronic pain: Secondary | ICD-10-CM | POA: Diagnosis not present

## 2017-07-16 DIAGNOSIS — M5442 Lumbago with sciatica, left side: Secondary | ICD-10-CM | POA: Diagnosis not present

## 2017-07-16 DIAGNOSIS — Z79899 Other long term (current) drug therapy: Secondary | ICD-10-CM | POA: Diagnosis not present

## 2017-07-16 DIAGNOSIS — I10 Essential (primary) hypertension: Secondary | ICD-10-CM | POA: Diagnosis not present

## 2017-07-30 DIAGNOSIS — Z79899 Other long term (current) drug therapy: Secondary | ICD-10-CM | POA: Diagnosis not present

## 2017-08-18 ENCOUNTER — Ambulatory Visit: Payer: Medicare Other | Admitting: Family Medicine

## 2018-03-11 ENCOUNTER — Other Ambulatory Visit: Payer: Self-pay | Admitting: Family Medicine

## 2018-03-11 ENCOUNTER — Telehealth: Payer: Self-pay | Admitting: Family Medicine

## 2018-03-11 NOTE — Telephone Encounter (Signed)
Copied from CRM [id number]. Topic: Quick Communication - See Telephone Encounter >> Mar 11, 2018  8:25 AM Burchel, Abbi R wrote: CRM for notification. See Telephone encounter for: 03/11/18.  Pt is requesting a letter excusing him from a required "Stretch & Flex Class"  each morning at work due to his back condition. Pt states his supervisor is requiring his participation and he is physically unable to participate.    Please call pt to let him know the quickest way to get him this letter. Pt states he may be able to get a fax number if needed.   [phone]

## 2018-03-11 NOTE — Telephone Encounter (Signed)
Please call him back.  I put this letter into his mychart account and he should be able to print out at home.  However if not able we can fax if he prefers

## 2018-03-13 NOTE — Telephone Encounter (Signed)
Patient notified and was able to print letter.

## 2018-08-10 LAB — HM DEXA SCAN

## 2018-10-27 ENCOUNTER — Telehealth: Payer: Self-pay | Admitting: Family Medicine

## 2018-10-27 NOTE — Telephone Encounter (Signed)
Patient calling requesting a call back from Dr. Lorelei Pont. He recently moved back to [place] from [place] and is dealing with intense pain from a collapsed hip. He is in need of Oxycodone to manage the pain and the doctor he was seeing in [place] won't write the script since he is no longer in that state. Advised him he'd need an appointment and he did say he's have his wife call to schedule.

## 2018-10-30 NOTE — Telephone Encounter (Signed)
Patient needs an appointment

## 2018-12-31 ENCOUNTER — Telehealth: Payer: Self-pay

## 2018-12-31 NOTE — Telephone Encounter (Signed)
Please give him a call. Generally opiates can cause sedation or sleepiness while taking them.  This effect should go away once off the medication.  Let me know if he has any other questions

## 2018-12-31 NOTE — Telephone Encounter (Signed)
Left message to return call to ask patient for more information.

## 2018-12-31 NOTE — Telephone Encounter (Signed)
Copied from [place] [id number]. Topic: General - Other >> Dec 29, 2018 11:26 AM Pauline Good wrote: Reason for CRM: need to know if there is any behavioral changes with the opiates the pt was taking. Please call to advise

## 2020-05-25 ENCOUNTER — Other Ambulatory Visit: Payer: Self-pay

## 2020-05-25 ENCOUNTER — Emergency Department (HOSPITAL_COMMUNITY)
Admission: EM | Admit: 2020-05-25 | Discharge: 2020-05-25 | Discharge status: Against Medical Advice | Payer: Medicare Other | Attending: Emergency Medicine | Admitting: Emergency Medicine

## 2020-05-25 ENCOUNTER — Encounter (HOSPITAL_COMMUNITY): Payer: Self-pay | Admitting: Emergency Medicine

## 2020-05-25 ENCOUNTER — Telehealth: Payer: Self-pay | Admitting: Family Medicine

## 2020-05-25 DIAGNOSIS — F17210 Nicotine dependence, cigarettes, uncomplicated: Secondary | ICD-10-CM | POA: Insufficient documentation

## 2020-05-25 DIAGNOSIS — G8929 Other chronic pain: Secondary | ICD-10-CM

## 2020-05-25 DIAGNOSIS — Z79899 Other long term (current) drug therapy: Secondary | ICD-10-CM | POA: Insufficient documentation

## 2020-05-25 DIAGNOSIS — Z8739 Personal history of other diseases of the musculoskeletal system and connective tissue: Secondary | ICD-10-CM | POA: Insufficient documentation

## 2020-05-25 DIAGNOSIS — I10 Essential (primary) hypertension: Secondary | ICD-10-CM | POA: Insufficient documentation

## 2020-05-25 DIAGNOSIS — M5441 Lumbago with sciatica, right side: Secondary | ICD-10-CM | POA: Insufficient documentation

## 2020-05-25 NOTE — ED Triage Notes (Signed)
Pt reports increased back pain. Pt reports hx of back pain. Pt states that he "used to take oxycodone but I haven't taken them in a while."

## 2020-05-25 NOTE — ED Provider Notes (Signed)
[facility] Provider Note   CSN: [id number] Arrival date & time: 05/25/20  4098     History Chief Complaint  Patient presents with   Back Pain    [facility] is a 60 y.o. male presenting for evaluation of back pain.  Patient states he has had multiple back injuries in the past, thus has chronic back pain. He recently has moved back from [place], is working to reestablish with the primary care doctor that he saw several years ago. He states he used to be on oxycodone for his back pain, however has not been on this recently. He has been taking Aleve, but not taking anything else for his pain. He states nothing besides oxycodone works for him. He denies new or recent fall, trauma, or injury. He denies fevers, chills, nausea, vomiting, urinary symptoms, loss of bowel bladder control. He does not take any medications daily. Pain is constant, worse with movement and palpation. Nothing makes it better.  Additional history obtained from chart review. Patient with a history of chronic back pain. I can see from his chart in 2020 that he was being seen in [place], no visit since then. 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  HPI     Past Medical History:  Diagnosis Date   Degenerative disc disease    reports back fx x 3.    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 No pertinent surgical history.     Family History  Problem Relation Age of Onset   COPD Father    Cancer Father 62       prostate   Cancer Brother 73       colon    Social History   Tobacco Use   Smoking status: Current Every Day Smoker    Packs/day: 0.50    Types:  Cigarettes   Smokeless tobacco: Never Used   Tobacco comment: I've been slowing down  Substance Use Topics   Alcohol use: No    Comment: gave up alcohol 1995   Drug use: No    Home Medications Prior to Admission medications   Medication Sig Start Date End Date Taking? Authorizing Provider  diazepam (VALIUM) 5 MG tablet Take 1 tablet (5 mg total) by mouth 3 (three) times daily. 06/05/17   Copland, Gwenlyn Found, MD  diazepam (VALIUM) 5 MG tablet Take 1 tablet (5 mg total) by mouth 3 (three) times daily. 07/07/17   Copland, Gwenlyn Found, MD  hydrochlorothiazide (HYDRODIURIL) 25 MG tablet Take 1 tablet (25 mg total) by mouth daily. 05/14/17   Copland, Gwenlyn Found, MD  Oxycodone HCl 20 MG TABS Take 1.5 tablets (30 mg total) by mouth every 4 (four) hours as needed. To fill on 03/01/2017 02/24/17   Copland, Gwenlyn Found, MD  Oxycodone HCl 20 MG TABS Take 1.5 tablets (30 mg total) by mouth every 4 (four) hours as needed. 06/05/17   Copland, Gwenlyn Found, MD  Oxycodone HCl 20 MG TABS TAKE 1 AND 1/2 TABLET EVERY 4 HOURS AS NEEDED 06/06/17   Copland, Gwenlyn Found, MD    Allergies    Celecoxib, Cyclobenzaprine hcl, and Tramadol  Review of Systems   Review of Systems  Musculoskeletal: Positive for back pain.  All other systems reviewed and are negative.   Physical Exam Updated Vital Signs BP (!) 143/102 (BP Location: Right Arm)    Pulse 80    Temp (!) 97.5 F (36.4 C) (Oral)    Resp 17    Ht 6\' 3"  (1.905 m)    Wt 70.5 kg    SpO2 100%    BMI 19.43 kg/m   Physical Exam Vitals and nursing note reviewed.  Constitutional:      General: He is not in acute distress.    Appearance: He is well-developed and well-nourished.     Comments: Appears nontoxic  HENT:     Head: Normocephalic and atraumatic.  Eyes:     Extraocular Movements: EOM normal.     Conjunctiva/sclera: Conjunctivae normal.     Pupils: Pupils are equal, round, and reactive to light.  Cardiovascular:     Rate and Rhythm: Normal rate and regular  rhythm.     Pulses: Normal pulses and intact distal pulses.  Pulmonary:     Effort: Pulmonary effort is normal. No respiratory distress.     Breath sounds: Normal breath sounds. No wheezing.  Abdominal:     General: There is no distension.     Palpations: Abdomen is soft. There is no mass.     Tenderness: There is no abdominal tenderness. There is no guarding or rebound.  Musculoskeletal:        General: Tenderness present. Normal range of motion.     Cervical back: Normal range of motion and neck supple.     Comments: Diffuse tenderness to palpation of the entire low back and over the right buttock. Positive straight leg raise on the right. No saddle paresthesias.  Skin:    General: Skin is warm and dry.     Capillary Refill: Capillary refill takes less than 2 seconds.  Neurological:     Mental Status: He is alert and oriented to person, place, and time.  Psychiatric:        Mood and Affect: Mood and affect normal.     ED Results / Procedures / Treatments   Labs (all labs ordered are listed, but only abnormal results are displayed) Labs Reviewed - No data to display  EKG None  Radiology No results found.  Procedures Procedures   Medications Ordered in ED Medications - No data to display  ED Course  I have reviewed the triage vital signs and the nursing notes.  Pertinent labs & imaging results that were available during my care of the patient were reviewed by me and considered in my medical decision making (see chart for details).    MDM Rules/Calculators/A&P                          Patient presenting for evaluation of chronic back pain. On exam, he appears nontoxic. No new neurologic deficits. Exam is not consistent with concerning etiology such as infection, cauda equina, myelopathy, spinal cord compression. Patient is asking for refill of his chronic pain medication. I discussed that from the ER, I cannot write a prescription for chronic pain medication. I offered  a dose in the ER, as well as multiple nonnarcotic options including steroids, muscle relaxers, Lidoderm patch, NSAIDs. Patient declined.   Patient walked out of the ER without receiving discharge paperwork.  Final Clinical Impression(s) / ED Diagnoses Final diagnoses:  Chronic bilateral low back pain with right-sided sciatica  Rx / DC Orders ED Discharge Orders    None       Alveria Apley, PA-C 05/25/20 9924    Vanetta Mulders, MD 06/07/20 1650

## 2020-05-25 NOTE — Telephone Encounter (Signed)
Patient would like to re-establish care with you.   Please Advise

## 2020-06-18 NOTE — Progress Notes (Deleted)
[facility] [address] [place], [place] [address] [phone]  Date:  06/22/2020   Name:  NOSSON WENDER   DOB:  1960/11/18   MRN:  [id number]  PCP:  Pearline Cables, MD    Chief Complaint: No chief complaint on file.   History of Present Illness:  GALO SAYED is a 60 y.o. very pleasant male patient who presents with the following:  Pt seen today to re-establish care. I have taken care of him in the past, but he moved to [place] for a few years in the interim Last seen by myself in 2019  Colon cancer screening   Patient Active Problem List   Diagnosis Date Noted  . 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 Vertebral fracture 11/02/2013  . HTN (hypertension) 10/18/2013  . OSTEOARTHRITIS 01/28/2008  . OSTEOARTHROS UNSPEC GEN/LOC PELV REGION&THIGH 01/28/2008  . HIP PAIN, RIGHT, CHRONIC 01/28/2008    Past Medical History:  Diagnosis Date  . Degenerative disc disease    reports back fx x 3.    No past surgical history on file.  Social History   Tobacco Use  . Smoking status: Current Every Day Smoker    Packs/day: 0.50    Types: Cigarettes  . Smokeless tobacco: Never Used  . Tobacco comment: I've been slowing down  Substance Use Topics  . Alcohol use: No    Comment: gave up alcohol 1995  . Drug use: No    Family History  Problem Relation Age of Onset  . COPD Father   . Cancer Father 10       prostate  . Cancer Brother 52       colon    Allergies  Allergen Reactions  . Celecoxib Other (See Comments)    Stopped breathing  . Cyclobenzaprine Hcl Hives  . Tramadol Diarrhea    Medication list has been reviewed and updated.  Current Outpatient Medications on File Prior to Visit  Medication Sig Dispense Refill  . diazepam (VALIUM) 5 MG tablet Take 1 tablet (5 mg total) by mouth 3 (three) times daily. 90 tablet 0  . diazepam (VALIUM) 5 MG tablet Take 1 tablet (5 mg total) by mouth 3 (three) times  daily. 90 tablet 0  . hydrochlorothiazide (HYDRODIURIL) 25 MG tablet Take 1 tablet (25 mg total) by mouth daily. 30 tablet 3  . Oxycodone HCl 20 MG TABS Take 1.5 tablets (30 mg total) by mouth every 4 (four) hours as needed. To fill on 03/01/2017 240 tablet 0  . Oxycodone HCl 20 MG TABS Take 1.5 tablets (30 mg total) by mouth every 4 (four) hours as needed. 180 tablet 0  . Oxycodone HCl 20 MG TABS TAKE 1 AND 1/2 TABLET EVERY 4 HOURS AS NEEDED 180 each 0   No current facility-administered medications on file prior to visit.    Review of Systems:  As per HPI- otherwise negative.   Physical Examination: There were no vitals filed for this visit. There were no vitals filed for this visit. There is no height or weight on file to calculate BMI. Ideal Body Weight:    GEN: no acute distress. HEENT: Atraumatic, Normocephalic.  Ears and Nose: No external deformity. CV: RRR, No M/G/R. No JVD. No thrill. No extra heart sounds. PULM: CTA B, no wheezes, crackles, rhonchi. No retractions. No resp. distress. No accessory muscle use. ABD: S, NT, ND, +BS. No rebound. No HSM. EXTR: No c/c/e PSYCH:  Normally interactive. Conversant.    Assessment and Plan: *** This visit occurred during the SARS-CoV-2 public health emergency.  Safety protocols were in place, including screening questions prior to the visit, additional usage of staff PPE, and extensive cleaning of exam room while observing appropriate contact time as indicated for disinfecting solutions.    Signed Abbe Amsterdam, MD

## 2020-06-22 ENCOUNTER — Ambulatory Visit: Payer: Self-pay | Admitting: Family Medicine

## 2020-12-17 ENCOUNTER — Emergency Department (HOSPITAL_COMMUNITY)
Admission: EM | Admit: 2020-12-17 | Discharge: 2020-12-17 | Disposition: A | Discharge status: Home / Self Care | Payer: Self-pay | Attending: Emergency Medicine | Admitting: Emergency Medicine

## 2020-12-17 ENCOUNTER — Encounter (HOSPITAL_COMMUNITY): Payer: Self-pay | Admitting: *Deleted

## 2020-12-17 ENCOUNTER — Other Ambulatory Visit: Payer: Self-pay

## 2020-12-17 ENCOUNTER — Emergency Department (HOSPITAL_COMMUNITY): Payer: Self-pay

## 2020-12-17 DIAGNOSIS — W19XXXA Unspecified fall, initial encounter: Secondary | ICD-10-CM

## 2020-12-17 DIAGNOSIS — M545 Low back pain, unspecified: Secondary | ICD-10-CM | POA: Insufficient documentation

## 2020-12-17 DIAGNOSIS — Z79899 Other long term (current) drug therapy: Secondary | ICD-10-CM | POA: Insufficient documentation

## 2020-12-17 DIAGNOSIS — M25551 Pain in right hip: Secondary | ICD-10-CM | POA: Insufficient documentation

## 2020-12-17 DIAGNOSIS — I10 Essential (primary) hypertension: Secondary | ICD-10-CM | POA: Insufficient documentation

## 2020-12-17 DIAGNOSIS — W010XXA Fall on same level from slipping, tripping and stumbling without subsequent striking against object, initial encounter: Secondary | ICD-10-CM | POA: Insufficient documentation

## 2020-12-17 DIAGNOSIS — F17210 Nicotine dependence, cigarettes, uncomplicated: Secondary | ICD-10-CM | POA: Insufficient documentation

## 2020-12-17 MED ORDER — OXYCODONE HCL 5 MG PO TABS: 5.0000 mg | ORAL_TABLET | Freq: Four times a day (QID) | ORAL | 0 refills | Status: DC | PRN

## 2020-12-17 MED ORDER — PREDNISONE 10 MG PO TABS: 30.0000 mg | ORAL_TABLET | Freq: Every day | ORAL | 0 refills | Status: DC

## 2020-12-17 MED ORDER — OXYCODONE HCL 5 MG PO TABS
10.0000 mg | ORAL_TABLET | Freq: Once | ORAL | Status: AC
  Administered 2020-12-17: 10 mg via ORAL
  Filled 2020-12-17: qty 2

## 2020-12-17 MED ORDER — OXYCODONE-ACETAMINOPHEN 5-325 MG PO TABS
1.0000 | ORAL_TABLET | Freq: Once | ORAL | Status: DC
  Filled 2020-12-17: qty 1

## 2020-12-17 NOTE — ED Provider Notes (Signed)
[facility] EMERGENCY DEPARTMENT Provider Note   CSN: [id number] Arrival date & time: 12/17/20  1100     History Chief Complaint  Patient presents with   [facility] is a 60 y.o. male.  HPI  Patient with significant medical history of DDD of the lower back, osteoarthritis of the right hip presents to the emergency department after having a mechanical fall.  Patient states on Thursday he tripped over some rigging that was on the ground causing him fall onto his right side.  He denies hitting his head, losing conscious, is not on anticoagulant.  Patient states he landed on his right hip and back.  He states since then he has been having worsening pain in his right hip and lower back.  States the pain does not radiate, does not have associated paresthesias or weakness in lower extremities, denies any urinary symptoms i.e. urinary incontinency, retention, trouble with bowel movements, he denies saddle paresthesias.  States he still able to ambulate but has pain when he does so,  has been taking some over-the-counter pain medications but does not seem to help with the pain.  He has no other complaints at this time.  He is not endorse headaches, change in vision, neck pain, chest pain, abdominal pain, worsening pedal edema.  Past Medical History:  Diagnosis Date   Degenerative disc disease    reports back fx x 3.    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 No pertinent surgical history.     Family History  Problem Relation Age of Onset   COPD Father    Cancer Father 67       prostate   Cancer Brother 7       colon    Social History   Tobacco Use   Smoking status: Every Day    Packs/day: 0.50    Types: Cigarettes    Smokeless tobacco: Never   Tobacco comments:    I've been slowing down  Substance Use Topics   Alcohol use: No    Comment: gave up alcohol 1995   Drug use: No    Home Medications Prior to Admission medications   Medication Sig Start Date End Date Taking? Authorizing Provider  oxyCODONE (ROXICODONE) 5 MG immediate release tablet Take 1 tablet (5 mg total) by mouth every 6 (six) hours as needed for up to 4 days for severe pain. 12/17/20 12/21/20 Yes Carroll Sage, PA-C  predniSONE (DELTASONE) 10 MG tablet Take 3 tablets (30 mg total) by mouth daily. 12/17/20  Yes Carroll Sage, PA-C  diazepam (VALIUM) 5 MG tablet Take 1 tablet (5 mg total) by mouth 3 (three) times daily. 06/05/17   Copland, Gwenlyn Found, MD  diazepam (VALIUM) 5 MG tablet Take 1 tablet (5 mg total) by mouth 3 (three) times daily. 07/07/17   Copland, Gwenlyn Found, MD  hydrochlorothiazide (HYDRODIURIL) 25 MG tablet Take 1 tablet (25 mg total) by mouth daily. 05/14/17   Copland, Gwenlyn Found, MD    Allergies    Celecoxib, Cyclobenzaprine hcl, and Tramadol  Review of Systems   Review of Systems  Constitutional:  Negative for chills and fever.  HENT:  Negative for congestion.   Respiratory:  Negative for shortness of breath.  Cardiovascular:  Negative for chest pain.  Gastrointestinal:  Negative for abdominal pain.  Genitourinary:  Negative for enuresis.  Musculoskeletal:  Positive for back pain.       Right-sided hip pain.  Skin:  Negative for rash.  Neurological:  Negative for dizziness and headaches.  Hematological:  Does not bruise/bleed easily.   Physical Exam Updated Vital Signs BP (!) 170/109 (BP Location: Right Arm)   Pulse 81   Temp 97.7 F (36.5 C) (Oral)   Resp 14   Ht 6\' 3"  (1.905 m)   Wt 81.6 kg   SpO2 100%   BMI 22.50 kg/m   Physical Exam Vitals and nursing note reviewed.  Constitutional:      General: He is not in acute distress.    Appearance: He is not ill-appearing.  HENT:     Head:  Normocephalic and atraumatic.     Nose: No congestion.  Eyes:     Conjunctiva/sclera: Conjunctivae normal.  Cardiovascular:     Rate and Rhythm: Normal rate and regular rhythm.     Pulses: Normal pulses.     Heart sounds: No murmur heard.   No friction rub. No gallop.  Pulmonary:     Effort: No respiratory distress.     Breath sounds: No wheezing, rhonchi or rales.  Abdominal:     Palpations: Abdomen is soft.     Tenderness: There is no abdominal tenderness. There is no right CVA tenderness or left CVA tenderness.  Musculoskeletal:     Comments: Spine was palpated he had tenderness palpation his lower lumbar region no step-off deformities present.  Lower extremity was visualized there is no leg shortening, internal or external rotation present, patient has full range of motion, 5 of 5 strength, neurovascular tact in the upper lower extremities.  Patient positive straight leg raise on the right side.  Skin:    General: Skin is warm and dry.  Neurological:     Mental Status: He is alert.  Psychiatric:        Mood and Affect: Mood normal.    ED Results / Procedures / Treatments   Labs (all labs ordered are listed, but only abnormal results are displayed) Labs Reviewed - No data to display  EKG None  Radiology CT Lumbar Spine Wo Contrast  Result Date: 12/17/2020 CLINICAL DATA:  Back trauma, no prior imaging (Age >= 16y). Fall 3 days ago. Low back and right hip pain. EXAM: CT LUMBAR SPINE WITHOUT CONTRAST TECHNIQUE: Multidetector CT imaging of the lumbar spine was performed without intravenous contrast administration. Multiplanar CT image reconstructions were also generated. COMPARISON:  Lumbar spine MRI 08/04/2010 FINDINGS: Segmentation: 5 lumbar type vertebrae. Alignment: Slight right convex curvature of the lumbar spine. No listhesis. Vertebrae: Mild L1 and moderate L3 superior endplate compression fractures, chronic and unchanged from the prior MRI. No acute fracture or suspicious  osseous lesion. Diffuse osteopenia. Paraspinal and other soft tissues: Mild scarring or atelectasis in the lung bases. Abdominal aortic atherosclerosis without aneurysm. Disc levels: Mild chronic disc space narrowing and mild disc bulging at L4-5 and L5-S1. Capacious lumbar spinal canal. No evidence of significant spinal canal or neural foraminal stenosis. IMPRESSION: 1. No acute osseous abnormality identified in the lumbar spine. 2. Chronic L1 and L3 compression fractures. 3. Aortic Atherosclerosis (ICD10-I70.0). Electronically Signed   By: 10/04/2010 M.D.   On: 12/17/2020 12:22   DG Hip Unilat With Pelvis 2-3 Views Right  Result Date: 12/17/2020 CLINICAL DATA:  Acute RIGHT hip pain following  fall 3 days ago. Initial encounter. EXAM: DG HIP (WITH OR WITHOUT PELVIS) 2-3V RIGHT COMPARISON:  07/21/2010 radiographs FINDINGS: There is no evidence of acute fracture or dislocation. Chronic flattening/deformity of the RIGHT femoral head with changes of AVN again noted with moderate to severe degenerative changes in the RIGHT hip. There has been little interval change since the prior study. IMPRESSION: 1. No evidence of acute abnormality. 2. Chronic flattening/deformity of the RIGHT femoral head with changes of AVN and moderate to severe degenerative changes in the RIGHT hip. Electronically Signed   By: Harmon Pier M.D.   On: 12/17/2020 12:35    Procedures Procedures   Medications Ordered in ED Medications  oxyCODONE (Oxy IR/ROXICODONE) immediate release tablet 10 mg (10 mg Oral Given 12/17/20 1238)    ED Course  I have reviewed the triage vital signs and the nursing notes.  Pertinent labs & imaging results that were available during my care of the patient were reviewed by me and considered in my medical decision making (see chart for details).    MDM Rules/Calculators/A&P                          Initial impression-patient presents with right sided hip and back pain.  He is alert, does not appear in  acute distress, vital signs reassuring.  Suspect muscular strain will obtain imaging for further evaluation.  Work-up-DG of hip shows no acute abnormalities, shows chronic finding/deformity right femoral head changes of AVN and moderate to severe degenerative changes.  CT lumbar spine negative for acute findings, shows chronic L1 and L3 compression fractures.  Rule out- I have low suspicion for spinal fracture or spinal cord abnormality as patient denies urinary incontinency, retention, difficulty with bowel movements, denies saddle paresthesias.  Spine was palpated there is no step-off, crepitus or gross deformities felt, patient had 5/5 strength, full range of motion, neurovascular fully intact in the lower extremities.  Imaging negative for fractures or dislocation. . Low suspicion for septic arthritis as patient denies IV drug use, skin exam was performed no erythematous, edema or warm joints noted.  Low suspicion for AAA or dissection as presentation is atypical of etiology.  Low suspicion for UTI, Pilo, kidney stone patient denies urinary symptoms, no CVA tenderness.   Plan-  Right hip and back pain-likely acute on chronic pain, will provide him with pain medications, recommend NSAIDs, follow-up with orthopedic surgery for further evaluation of hip and referral to neurosurgery for back pain.  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   Patient given at home care as well strict return precautions.  Patient verbalized that they understood agreed to said plan.  Final Clinical Impression(s) / ED Diagnoses Final diagnoses:  Fall, initial encounter    Rx / DC Orders ED Discharge Orders          Ordered    oxyCODONE (ROXICODONE) 5 MG immediate release tablet  Every 6 hours PRN        12/17/20 1257    predniSONE (DELTASONE) 10 MG tablet  Daily         12/17/20 1257             Markevius, Trombetta 12/17/20 1300    Linwood Dibbles, MD 12/18/20 [id number]

## 2020-12-17 NOTE — Discharge Instructions (Signed)
You have been seen here for back and hip pain, I recommend taking over-the-counter pain medications like ibuprofen and/or Tylenol every 6 as needed.  Please follow dosage and on the back of bottle.  I also recommend applying heat to the area and stretching out the muscles as this will help decrease stiffness and pain.  I have given you information on exercises please follow. I have given you a short course of narcotics please take as prescribed.  This medication can make you drowsy do not consume alcohol or operate heavy machinery when taking this medication.  I have also given you prescription for prednisone please take as prescribed.  Given you referral to orthopedic surgery recommend following up for your hip, given you referral for neurosurgery follow-up for your back.  Come back to the emergency department if you develop chest pain, shortness of breath, severe abdominal pain, uncontrolled nausea, vomiting, diarrhea.

## 2020-12-17 NOTE — ED Triage Notes (Signed)
Pt fell Thursday after tripping.  Pt with lower back and right hip pain.

## 2020-12-20 ENCOUNTER — Emergency Department (HOSPITAL_COMMUNITY)
Admission: EM | Admit: 2020-12-20 | Discharge: 2020-12-20 | Disposition: A | Discharge status: Home / Self Care | Payer: Self-pay | Attending: Emergency Medicine | Admitting: Emergency Medicine

## 2020-12-20 ENCOUNTER — Encounter (HOSPITAL_COMMUNITY): Payer: Self-pay

## 2020-12-20 ENCOUNTER — Other Ambulatory Visit: Payer: Self-pay

## 2020-12-20 DIAGNOSIS — I10 Essential (primary) hypertension: Secondary | ICD-10-CM | POA: Insufficient documentation

## 2020-12-20 DIAGNOSIS — W19XXXD Unspecified fall, subsequent encounter: Secondary | ICD-10-CM

## 2020-12-20 DIAGNOSIS — M545 Low back pain, unspecified: Secondary | ICD-10-CM | POA: Insufficient documentation

## 2020-12-20 DIAGNOSIS — W010XXD Fall on same level from slipping, tripping and stumbling without subsequent striking against object, subsequent encounter: Secondary | ICD-10-CM | POA: Insufficient documentation

## 2020-12-20 DIAGNOSIS — F17210 Nicotine dependence, cigarettes, uncomplicated: Secondary | ICD-10-CM | POA: Insufficient documentation

## 2020-12-20 DIAGNOSIS — Z79899 Other long term (current) drug therapy: Secondary | ICD-10-CM | POA: Insufficient documentation

## 2020-12-20 DIAGNOSIS — Z760 Encounter for issue of repeat prescription: Secondary | ICD-10-CM | POA: Insufficient documentation

## 2020-12-20 MED ORDER — OXYCODONE HCL 5 MG PO TABS: 10.0000 mg | ORAL_TABLET | Freq: Four times a day (QID) | ORAL | 0 refills | Status: DC | PRN

## 2020-12-20 MED ORDER — LIDOCAINE 5 % EX PTCH: 1.0000 | MEDICATED_PATCH | CUTANEOUS | 0 refills | Status: DC

## 2020-12-20 MED ORDER — OXYCODONE-ACETAMINOPHEN 10-325 MG PO TABS: 1.0000 | ORAL_TABLET | Freq: Four times a day (QID) | ORAL | 0 refills | Status: DC | PRN

## 2020-12-20 MED ORDER — LIDOCAINE 5 % EX PTCH
1.0000 | MEDICATED_PATCH | CUTANEOUS | Status: DC
  Administered 2020-12-20: 1 via TRANSDERMAL
  Filled 2020-12-20: qty 1

## 2020-12-20 NOTE — Discharge Instructions (Addendum)
Do not drive within 4 hours of taking the medication prescribed as it will make you drowsy.  Continue with your plan for reestablishing care with your primary doctor.  Also continue with the referrals given to you at Sunday's visit for further evaluation of your lumbar spine and your right hip injury.  Get rechecked immediately for any weakness or numbness in your legs or if you develop any urinary or fecal incontinence or retention as discussed.  Additionally your blood pressure remains elevated today - this could be from your pain or the anxiety of being here since you have white coat syndrome.  It is important for you to have your blood pressure rechecked within the next week.

## 2020-12-20 NOTE — ED Triage Notes (Signed)
Pt presents to ED for medication refill for oxycodone. Pt states he fell on Sunday and seen PA. Pt attempting to get in with PCP and Neurosurgeon but unable to get appointment so far.
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
## 2020-12-24 ENCOUNTER — Encounter (HOSPITAL_COMMUNITY): Payer: Self-pay | Admitting: Emergency Medicine

## 2020-12-24 ENCOUNTER — Other Ambulatory Visit: Payer: Self-pay

## 2020-12-24 ENCOUNTER — Emergency Department (HOSPITAL_COMMUNITY)
Admission: EM | Admit: 2020-12-24 | Discharge: 2020-12-24 | Disposition: A | Discharge status: Home / Self Care | Payer: Self-pay | Attending: Emergency Medicine | Admitting: Emergency Medicine

## 2020-12-24 DIAGNOSIS — F17210 Nicotine dependence, cigarettes, uncomplicated: Secondary | ICD-10-CM | POA: Insufficient documentation

## 2020-12-24 DIAGNOSIS — M545 Low back pain, unspecified: Secondary | ICD-10-CM | POA: Insufficient documentation

## 2020-12-24 DIAGNOSIS — Z760 Encounter for issue of repeat prescription: Secondary | ICD-10-CM | POA: Insufficient documentation

## 2020-12-24 DIAGNOSIS — Z79899 Other long term (current) drug therapy: Secondary | ICD-10-CM | POA: Insufficient documentation

## 2020-12-24 DIAGNOSIS — I10 Essential (primary) hypertension: Secondary | ICD-10-CM | POA: Insufficient documentation

## 2020-12-24 MED ORDER — OXYCODONE HCL 5 MG PO TABS: 10.0000 mg | ORAL_TABLET | Freq: Four times a day (QID) | ORAL | 0 refills | Status: DC | PRN

## 2020-12-24 MED ORDER — AMLODIPINE BESYLATE 5 MG PO TABS: 5.0000 mg | ORAL_TABLET | Freq: Every day | ORAL | 0 refills | Status: DC

## 2020-12-24 NOTE — ED Triage Notes (Signed)
Pt to the Ed with request for a med refill on his pain and BP medications.

## 2020-12-24 NOTE — Discharge Instructions (Addendum)
Do not drive within 4 hours of taking oxycodone as this will make you drowsy.  Avoid lifting,  Bending,  Twisting or any other activity that worsens your pain over the next week.  Try application of heat to your back 20 minutes several times daily.  Avoid activities and exercises that worsens pain.
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
## 2021-01-13 ENCOUNTER — Emergency Department (HOSPITAL_COMMUNITY)
Admission: EM | Admit: 2021-01-13 | Discharge: 2021-01-13 | Disposition: A | Discharge status: Home / Self Care | Payer: Self-pay | Attending: Emergency Medicine | Admitting: Emergency Medicine

## 2021-01-13 ENCOUNTER — Encounter (HOSPITAL_COMMUNITY): Payer: Self-pay | Admitting: *Deleted

## 2021-01-13 ENCOUNTER — Other Ambulatory Visit: Payer: Self-pay

## 2021-01-13 DIAGNOSIS — Z79899 Other long term (current) drug therapy: Secondary | ICD-10-CM | POA: Insufficient documentation

## 2021-01-13 DIAGNOSIS — M5442 Lumbago with sciatica, left side: Secondary | ICD-10-CM

## 2021-01-13 DIAGNOSIS — M25551 Pain in right hip: Secondary | ICD-10-CM | POA: Insufficient documentation

## 2021-01-13 DIAGNOSIS — R200 Anesthesia of skin: Secondary | ICD-10-CM | POA: Insufficient documentation

## 2021-01-13 DIAGNOSIS — I10 Essential (primary) hypertension: Secondary | ICD-10-CM | POA: Insufficient documentation

## 2021-01-13 DIAGNOSIS — G8929 Other chronic pain: Secondary | ICD-10-CM

## 2021-01-13 DIAGNOSIS — M5431 Sciatica, right side: Secondary | ICD-10-CM | POA: Insufficient documentation

## 2021-01-13 DIAGNOSIS — F17210 Nicotine dependence, cigarettes, uncomplicated: Secondary | ICD-10-CM | POA: Insufficient documentation

## 2021-01-13 MED ORDER — OXYCODONE HCL 5 MG PO TABS
10.0000 mg | ORAL_TABLET | Freq: Once | ORAL | Status: AC
  Administered 2021-01-13: 10 mg via ORAL
  Filled 2021-01-13: qty 2

## 2021-01-13 MED ORDER — OXYCODONE HCL 5 MG PO TABS: 10.0000 mg | ORAL_TABLET | Freq: Four times a day (QID) | ORAL | 0 refills | Status: DC | PRN

## 2021-01-13 NOTE — ED Provider Notes (Signed)
[facility] EMERGENCY DEPARTMENT Provider Note   CSN: [id number] Arrival date & time: 01/13/21  1029     History Chief Complaint  Patient presents with   Hip Pain    Grant Galvan is a 60 y.o. male with history of degenerative disc disease of lumbar spine, osteoarthritis, and osteoporosis who presents with persistent right hip and lower back pain.  Patient had a fall on 9/15 and landed on his right hip and back.  Imaging at that time showed severe degenerative changes in the right hip, CT lumbar spine showed chronic L1 and L3 compression fractures.  Patient was seen again on 9/21 and 9/25 for refills of pain medication as he has had difficulty getting a primary care appointment without insurance.  He did have an appointment scheduled for yesterday, states that they would not see him without insurance.  He has had no progression of pain or symptoms, states he has been trying Aleve at home without relief.  Patient states that he had previously been under pain management for chronic pain, reports 3 prior back fractures.  He came off all medications 3 years ago and was tolerating his symptoms well until this fall on 9/15.  States he has chronic numbness in bilateral feet, no progression of this.  Denies weakness in his legs, no urinary or fecal incontinence or retention.  No fevers, no history of IV drug use.  He adamantly states he does not want surgery to correct his symptoms and would rather manage it medically.   Hip Pain Pertinent negatives include no chest pain, no abdominal pain and no shortness of breath.      Past Medical History:  Diagnosis Date   Degenerative disc disease    reports back fx x 3.    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 No pertinent surgical history.     Family History  Problem Relation Age of Onset   COPD Father    Cancer Father 16       prostate   Cancer Brother 76       colon    Social History   Tobacco Use   Smoking status: Every Day    Packs/day: 0.50    Types: Cigarettes   Smokeless tobacco: Never   Tobacco comments:    I've been slowing down  Substance Use Topics   Alcohol use: No    Comment: gave up alcohol 1995   Drug use: No    Home Medications Prior to Admission medications   Medication Sig Start Date End Date Taking? Authorizing Provider  oxyCODONE (ROXICODONE) 5 MG immediate release tablet Take 2 tablets (10 mg total) by mouth every 6 (six) hours as needed for severe pain. 01/13/21  Yes Fielding Mault T, PA-C  amLODipine (NORVASC) 5 MG tablet Take 1 tablet (5 mg total) by mouth daily. 12/24/20   Burgess Amor, PA-C  diazepam (VALIUM) 5 MG tablet Take 1 tablet (5 mg total) by mouth 3 (three) times daily. 06/05/17   Copland, Gwenlyn Found, MD  diazepam (VALIUM) 5 MG tablet Take 1 tablet (5 mg total) by mouth 3 (three) times daily. 07/07/17   Copland, Gwenlyn Found, MD  hydrochlorothiazide (HYDRODIURIL) 25 MG tablet Take 1 tablet (25 mg total) by mouth daily.  05/14/17   Copland, Gwenlyn Found, MD  lidocaine (LIDODERM) 5 % Place 1 patch onto the skin daily. Remove & Discard patch within 12 hours or as directed by MD 12/20/20   Burgess Amor, PA-C  predniSONE (DELTASONE) 10 MG tablet Take 3 tablets (30 mg total) by mouth daily. 12/17/20   Carroll Sage, PA-C    Allergies    Celecoxib, Cyclobenzaprine hcl, and Tramadol  Review of Systems   Review of Systems  Constitutional:  Negative for chills and fever.  Respiratory:  Negative for shortness of breath.   Cardiovascular:  Negative for chest pain.  Gastrointestinal:  Negative for abdominal pain, nausea and vomiting.  Musculoskeletal:  Positive for back pain and gait problem.       Right hip pain. Antalgic gait.    Physical Exam Updated Vital Signs BP (!) 148/108 (BP Location: Right Arm)   Pulse 89   Temp 97.8 F (36.6 C) (Oral)   Resp 20   SpO2 98%   Physical Exam Vitals and nursing note reviewed.  Constitutional:      Appearance: Normal appearance.  HENT:     Head: Normocephalic and atraumatic.  Eyes:     Conjunctiva/sclera: Conjunctivae normal.  Pulmonary:     Effort: Pulmonary effort is normal. No respiratory distress.  Musculoskeletal:     Comments: Full passive ROM of bilateral hips and bilateral knees.  5/5 strength BLE. Significant point tenderness over right hip and right lumbar spine.  No midline spinal tenderness.  No obvious deformities, step-offs or crepitus.  Sensation intact bilaterally.  Skin:    General: Skin is warm and dry.  Neurological:     Mental Status: He is alert.  Psychiatric:        Mood and Affect: Mood normal.        Behavior: Behavior normal.    ED Results / Procedures / Treatments   Labs (all labs ordered are listed, but only abnormal results are displayed) Labs Reviewed - No data to display  EKG None  Radiology No results found.  Procedures Procedures   Medications Ordered in ED Medications  oxyCODONE (Oxy IR/ROXICODONE) immediate release tablet 10 mg (10 mg Oral Given 01/13/21 1409)    ED Course  I have reviewed the triage vital signs and the nursing notes.  Pertinent labs & imaging results that were available during my care of the patient were reviewed by me and considered in my medical decision making (see chart for details).    MDM Rules/Calculators/A&P                           Patient is 60 year old male who presents with recurrent right hip and lower back pain.    There is no neurological deficits on my exam consistent with progression in symptoms.  No saddle anesthesia or incontinence/retention of bowel or bladder that would be consistent with a cauda equina picture.  Prior work-up showed chronic changes, no acute  fractures or dislocations requiring emergent or surgical evaluation.  Not considering reimaging today as symptoms have not worsened.   Reviewed PDMP, and this is consistent with reported history.  Plan to prescribe oxycodone today at the same dose that he was receiving prior.  Discussed importance of making appointment with primary care for long-term management of his pain.  Discussed reasons to return to the emergency department.  Patient agreeable to plan.  Final Clinical Impression(s) / ED Diagnoses Final diagnoses:  Right hip pain  Chronic right-sided low back pain with bilateral sciatica    Rx / DC Orders ED Discharge Orders          Ordered    oxyCODONE (ROXICODONE) 5 MG immediate release tablet  Every 6 hours PRN        01/13/21 1340             Lemmie Steinhaus T, PA-C 01/13/21 1712    Vanetta Mulders, MD 01/14/21 [phone]

## 2021-01-13 NOTE — ED Triage Notes (Signed)
Recurrent hip and back pain

## 2021-01-13 NOTE — Discharge Instructions (Addendum)
You were seen in the emergency department today for right hip pain and back pain.  I am prescribing you the same pain medication that you have been on.  You can take this in conjunction with Aleve.  As we discussed it is incredibly important for you to get in with a primary doctor to discuss long-term pain management.   Continue to monitor how you're doing and return to the ER for new or worsening symptoms such as new pain, numbness in groin, incontinence of bowels or bladder.   It has been a pleasure seeing and caring for you today and I hope you start feeling better soon!

## 2021-01-23 ENCOUNTER — Emergency Department (HOSPITAL_COMMUNITY)
Admission: EM | Admit: 2021-01-23 | Discharge: 2021-01-23 | Disposition: A | Discharge status: Home / Self Care | Payer: Self-pay | Attending: Emergency Medicine | Admitting: Emergency Medicine

## 2021-01-23 ENCOUNTER — Encounter (HOSPITAL_COMMUNITY): Payer: Self-pay | Admitting: *Deleted

## 2021-01-23 DIAGNOSIS — F17210 Nicotine dependence, cigarettes, uncomplicated: Secondary | ICD-10-CM | POA: Insufficient documentation

## 2021-01-23 DIAGNOSIS — G8929 Other chronic pain: Secondary | ICD-10-CM | POA: Insufficient documentation

## 2021-01-23 DIAGNOSIS — I10 Essential (primary) hypertension: Secondary | ICD-10-CM | POA: Insufficient documentation

## 2021-01-23 DIAGNOSIS — M25551 Pain in right hip: Secondary | ICD-10-CM | POA: Insufficient documentation

## 2021-01-23 DIAGNOSIS — M545 Low back pain, unspecified: Secondary | ICD-10-CM | POA: Insufficient documentation

## 2021-01-23 DIAGNOSIS — Z79899 Other long term (current) drug therapy: Secondary | ICD-10-CM | POA: Insufficient documentation

## 2021-01-23 MED ORDER — OXYCODONE HCL 5 MG PO CAPS: 5.0000 mg | ORAL_CAPSULE | Freq: Four times a day (QID) | ORAL | 0 refills | Status: AC | PRN

## 2021-01-23 NOTE — ED Provider Notes (Signed)
[facility] EMERGENCY DEPARTMENT Provider Note   CSN: [id number] Arrival date & time: 01/23/21  1228     History Chief Complaint  Patient presents with   Back Pain    Grant Galvan is a 60 y.o. male.  HPI   Pt is a 60 y/o male with a h/o DDD who presents to the ED today for eval of back and hip pain. He has a h/o chronic pain in these areas. His sxs have been worse for the last month after he sustained a fall last month. He has had difficulty getting f/u care as he does not have insurance. Describes pain as a hot knife in the hip and low back. He is requesting a refill of his pain medication.  Pt denies any numbness/tingling/weakness to the BLE. Denies saddle anesthesia. Denies loss of control of bowels or bladder. No urinary retention. No fevers. Denies a h/o IVDU. Denies a h/o CA .   Past Medical History:  Diagnosis Date   Degenerative disc disease    reports back fx x 3.    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 No pertinent surgical history.     Family History  Problem Relation Age of Onset   COPD Father    Cancer Father 75       prostate   Cancer Brother 92       colon    Social History   Tobacco Use   Smoking status: Every Day    Packs/day: 0.50    Types: Cigarettes   Smokeless tobacco: Never   Tobacco comments:    I've been slowing down  Substance Use Topics   Alcohol use: No    Comment: gave up alcohol 1995   Drug use: No    Home Medications Prior to Admission medications   Medication Sig Start Date End Date Taking? Authorizing Provider  oxycodone (OXY-IR) 5 MG capsule Take 1 capsule (5 mg total) by mouth every 6 (six) hours as needed for up to 5 days. 01/23/21 01/28/21 Yes Jamorian Dimaria S,  PA-C  amLODipine (NORVASC) 5 MG tablet Take 1 tablet (5 mg total) by mouth daily. 12/24/20   Burgess Amor, PA-C  diazepam (VALIUM) 5 MG tablet Take 1 tablet (5 mg total) by mouth 3 (three) times daily. 06/05/17   Copland, Gwenlyn Found, MD  diazepam (VALIUM) 5 MG tablet Take 1 tablet (5 mg total) by mouth 3 (three) times daily. 07/07/17   Copland, Gwenlyn Found, MD  hydrochlorothiazide (HYDRODIURIL) 25 MG tablet Take 1 tablet (25 mg total) by mouth daily. 05/14/17   Copland, Gwenlyn Found, MD  lidocaine (LIDODERM) 5 % Place 1 patch onto the skin daily. Remove & Discard patch within 12 hours or as directed by MD 12/20/20   Burgess Amor, PA-C  predniSONE (DELTASONE) 10 MG tablet Take 3 tablets (30 mg total) by mouth daily. 12/17/20   Carroll Sage, PA-C    Allergies    Celecoxib, Cyclobenzaprine hcl, and Tramadol  Review of Systems   Review of Systems  Constitutional:  Negative for fever.  Musculoskeletal:  Positive for back pain.       Right hip pain  Skin:  Negative for wound.  Neurological:  Negative for weakness and numbness.   Physical  Exam Updated Vital Signs BP (!) 155/95 (BP Location: Right Arm)   Pulse 71   Temp 98 F (36.7 C) (Oral)   Resp 18   SpO2 97%   Physical Exam Constitutional:      General: He is not in acute distress.    Appearance: He is well-developed.  Eyes:     Conjunctiva/sclera: Conjunctivae normal.  Cardiovascular:     Rate and Rhythm: Normal rate.  Pulmonary:     Effort: Pulmonary effort is normal.  Musculoskeletal:     Comments: TTP the lumbar spine and to the right hip. 5/5 strength to the BLE. Normal sensation throughout.   Skin:    General: Skin is warm and dry.  Neurological:     Mental Status: He is alert and oriented to person, place, and time.    ED Results / Procedures / Treatments   Labs (all labs ordered are listed, but only abnormal results are displayed) Labs Reviewed - No data to display  EKG None  Radiology No results  found.  Procedures Procedures   Medications Ordered in ED Medications - No data to display  ED Course  I have reviewed the triage vital signs and the nursing notes.  Pertinent labs & imaging results that were available during my care of the patient were reviewed by me and considered in my medical decision making (see chart for details).    MDM Rules/Calculators/A&P                          Patient with back pain and hip pain that is chronic.  No neurological deficits and normal neuro exam.  Patient can walk but states is painful.  No loss of bowel or bladder control.  No concern for cauda equina.  No fever, night sweats, weight loss, h/o cancer, IVDU.  RICE protocol and pain medicine indicated and discussed with patient. He has an appt with pcp on nov 9. He was made aware that we can no longer fill rx for his chronic pain meds in the ED. He will need to have further meds filled by his pcp.     Final Clinical Impression(s) / ED Diagnoses Final diagnoses:  Low back pain, unspecified back pain laterality, unspecified chronicity, unspecified whether sciatica present    Rx / DC Orders ED Discharge Orders          Ordered    oxycodone (OXY-IR) 5 MG capsule  Every 6 hours PRN        01/23/21 [address], PA-C 01/23/21 1809    Bethann Berkshire, MD 01/27/21 1142

## 2021-01-23 NOTE — ED Triage Notes (Signed)
Recurrent back and hip pain

## 2021-01-23 NOTE — Discharge Instructions (Addendum)
Prescription given for Oxycodone. Take medication as directed and do not operate machinery, drive a car, or work while taking this medication as it can make you drowsy.   Please follow up with your primary care provider within 5-7 days for re-evaluation of your symptoms.   Return to the emergency department immediately if you experience any back pain associated with fevers, loss of control of your bowels/bladder, weakness/numbness to your legs, numbness to your groin area, inability to walk, or inability to urinate.

## 2021-11-29 ENCOUNTER — Encounter: Payer: Self-pay | Admitting: Physician Assistant

## 2021-11-29 ENCOUNTER — Ambulatory Visit: Payer: Self-pay | Admitting: Physician Assistant

## 2021-11-29 ENCOUNTER — Telehealth: Payer: Self-pay

## 2021-11-29 VITALS — BP 129/89 | HR 79 | Temp 98.0°F | Ht 75.0 in | Wt 154.0 lb

## 2021-11-29 DIAGNOSIS — G8921 Chronic pain due to trauma: Secondary | ICD-10-CM

## 2021-11-29 DIAGNOSIS — Z7689 Persons encountering health services in other specified circumstances: Secondary | ICD-10-CM

## 2021-11-29 DIAGNOSIS — Z5320 Procedure and treatment not carried out because of patient's decision for unspecified reasons: Secondary | ICD-10-CM

## 2021-11-29 DIAGNOSIS — Z125 Encounter for screening for malignant neoplasm of prostate: Secondary | ICD-10-CM

## 2021-11-29 DIAGNOSIS — Z13220 Encounter for screening for lipoid disorders: Secondary | ICD-10-CM

## 2021-11-29 DIAGNOSIS — R21 Rash and other nonspecific skin eruption: Secondary | ICD-10-CM

## 2021-11-29 DIAGNOSIS — J449 Chronic obstructive pulmonary disease, unspecified: Secondary | ICD-10-CM

## 2021-11-29 DIAGNOSIS — F17200 Nicotine dependence, unspecified, uncomplicated: Secondary | ICD-10-CM

## 2021-11-29 MED ORDER — FLUTICASONE-SALMETEROL 100-50 MCG/ACT IN AEPB: 1.0000 | INHALATION_SPRAY | Freq: Two times a day (BID) | RESPIRATORY_TRACT | 0 refills | Status: DC

## 2021-11-29 MED ORDER — CLOTRIMAZOLE-BETAMETHASONE 1-0.05 % EX CREA: 1.0000 | TOPICAL_CREAM | Freq: Two times a day (BID) | CUTANEOUS | 0 refills | Status: DC

## 2021-11-29 MED ORDER — ALBUTEROL SULFATE HFA 108 (90 BASE) MCG/ACT IN AERS: 2.0000 | INHALATION_SPRAY | Freq: Four times a day (QID) | RESPIRATORY_TRACT | 2 refills | Status: DC | PRN

## 2021-11-29 NOTE — Progress Notes (Signed)
BP 129/89   Pulse 79   Temp 98 F (36.7 C)   Wt 154 lb (69.9 kg)   SpO2 98%   BMI 19.25 kg/m    Subjective:    Patient ID: Grant Galvan, male    DOB: January 23, 1961, 61 y.o.   MRN: [id number]  HPI: Grant Galvan is a 61 y.o. male presenting on 11/29/2021 for No chief complaint on file.   HPI  Chief Complaint  Patient presents with   New Patient (Initial Visit)    Pt Previously worked as a Geophysical data processor  He never got covid vaccination and doesn't want it He never had colon cancer screening and doesn't want it.  He has copd.  He never been prescribed inhalers but has used some from other people and it helped.   He continues to smoke but is trying to cut back.  Pt has chronic pain.  It appears that last time pt was seen by pain management was in 2019.  He cannot afford to go to previous provider due to cost since he is uninsured.  Discussed orthopedics-  Offered orthopedics but pt declined because he says he isn't going to get surgery so no need to see orthopedics.  Pt says chronic pain is related to back fracture multiple times.    Pt says rash on his left lower leg for several years.       Relevant past medical, surgical, family and social history reviewed and updated as indicated. Interim medical history since our last visit reviewed. Allergies and medications reviewed and updated.   No current outpatient medications on file.    Review of Systems  Per HPI unless specifically indicated above     Objective:    BP 129/89   Pulse 79   Temp 98 F (36.7 C)   Wt 154 lb (69.9 kg)   SpO2 98%   BMI 19.25 kg/m   Wt Readings from Last 3 Encounters:  11/29/21 154 lb (69.9 kg)  12/24/20 159 lb 8 oz (72.3 kg)  12/20/20 159 lb 8 oz (72.3 kg)    Physical Exam Vitals reviewed.  Constitutional:      General: He is not in acute distress.    Appearance: He is well-developed. He is not toxic-appearing.  HENT:     Head: Normocephalic and atraumatic.      Right Ear: External ear normal. There is impacted cerumen.     Left Ear: External ear normal. There is impacted cerumen.  Eyes:     Extraocular Movements: Extraocular movements intact.     Conjunctiva/sclera: Conjunctivae normal.     Pupils: Pupils are equal, round, and reactive to light.  Neck:     Thyroid: No thyromegaly.  Cardiovascular:     Rate and Rhythm: Normal rate and regular rhythm.  Pulmonary:     Breath sounds: Normal breath sounds. No wheezing, rhonchi or rales.     Comments: Increased effort of breathing.  RR 20.   Abdominal:     General: Bowel sounds are normal.     Palpations: Abdomen is soft. There is no mass.     Tenderness: There is no abdominal tenderness.  Musculoskeletal:     Cervical back: Neck supple.     Right lower leg: No edema.     Left lower leg: No edema.  Lymphadenopathy:     Cervical: No cervical adenopathy.  Skin:    General: Skin is warm and dry.     Findings: Rash present.  Comments: Thick rash LLE as pictured  Neurological:     Mental Status: He is alert and oriented to person, place, and time.     Motor: No weakness or tremor.     Comments: Pt ambulates with a cane.   Psychiatric:        Attention and Perception: Attention normal.        Speech: Speech normal.        Behavior: Behavior normal. Behavior is cooperative.                  Assessment & Plan:    Encounter Diagnoses  Name Primary?   Encounter to establish care Yes   Chronic obstructive pulmonary disease, unspecified COPD type (HCC)    Tobacco use disorder    Chronic pain due to trauma    Colon cancer screening declined    Screening cholesterol level    Prostate cancer screening       HCM: -pt Declined colon cancer screening -Baseline labs-  ordered-   pt says he will not get them done   COPD:  Rx Adviar and albuterol.  Pt was educated on maintenance inhaler versus rescue inhaler.  Pt was educated on how to properly use the advair discus.   -encouraged smoking cessation   Chronic pain. -inquired about referral to orthopedics to see if there was anything to help with his pain and he declined referral -Refer to pain management.  Pt was notified that pain mgt is behind with their referral so could take 4-6 weeks before he hears from them -Check with CC about [facility]   Rash -Uncertain etiology.  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   Rx lotrisone.  If that fails to help, he may need referral to dermatology for biopsy.  Suspect something fungal.  Will treat with lotrisone  History HTN -BP good today.  Will monitor

## 2021-11-29 NOTE — Telephone Encounter (Signed)
Called to follow up with client after is first appointment with [facility]. He and his wife Agustin Cree were on the phone. They state the appointment went well, they are awaiting inhalers of Advair and albuterol to arrive via [facility].  She was also ordered cream for rash on is leg. I will check price of cream at [facility] for possible assistance.  Client has fear of needles and will not get labs drawn and provider was made aware during his visit. Provider was also referring client to [facility] for pain management, but client declines due to past experience with he and his wife.  Food : reminded client and wife of the [employer], they are aware they can come back next week.  Housing: living in [place] with spouse's income.  Transportation no needs  Client and wife have CM and Main line of [facility] to call for any needs. Francee Nodal RN [employer]

## 2021-12-10 ENCOUNTER — Other Ambulatory Visit: Payer: Self-pay | Admitting: Physician Assistant

## 2021-12-10 MED ORDER — ALBUTEROL SULFATE HFA 108 (90 BASE) MCG/ACT IN AERS: 2.0000 | INHALATION_SPRAY | Freq: Four times a day (QID) | RESPIRATORY_TRACT | 0 refills | Status: DC | PRN

## 2021-12-10 MED ORDER — FLUTICASONE-SALMETEROL 100-50 MCG/ACT IN AEPB: 1.0000 | INHALATION_SPRAY | Freq: Two times a day (BID) | RESPIRATORY_TRACT | 0 refills | Status: DC

## 2021-12-24 ENCOUNTER — Encounter (HOSPITAL_COMMUNITY): Payer: Self-pay | Admitting: Emergency Medicine

## 2021-12-24 ENCOUNTER — Other Ambulatory Visit: Payer: Self-pay

## 2021-12-24 ENCOUNTER — Emergency Department (HOSPITAL_COMMUNITY)
Admission: EM | Admit: 2021-12-24 | Discharge: 2021-12-24 | Disposition: A | Discharge status: Home / Self Care | Payer: Commercial Managed Care - HMO | Attending: Emergency Medicine | Admitting: Emergency Medicine

## 2021-12-24 DIAGNOSIS — M549 Dorsalgia, unspecified: Secondary | ICD-10-CM | POA: Insufficient documentation

## 2021-12-24 DIAGNOSIS — Z5321 Procedure and treatment not carried out due to patient leaving prior to being seen by health care provider: Secondary | ICD-10-CM | POA: Insufficient documentation

## 2021-12-24 NOTE — ED Triage Notes (Signed)
Pt to the ED with back pain that keeps him from sleeping for the past 4 night.  Pt states he has had back pain for the last 20 years.

## 2021-12-27 ENCOUNTER — Other Ambulatory Visit: Payer: Self-pay | Admitting: Family Medicine

## 2021-12-27 ENCOUNTER — Telehealth: Payer: Self-pay | Admitting: Physician Assistant

## 2021-12-27 NOTE — Telephone Encounter (Signed)
Okay to re-establish? 

## 2021-12-27 NOTE — Telephone Encounter (Signed)
Grant Galvan (spouse DPR OK) called stating that pt would like to re-establish care with Dr. Lorelei Pont. For a time, he didn't have insurance to come see her but now he does and would like to see Dr. Lorelei Pont along with Carlyon Shadow.   Is care re-establishment ok for this pt?

## 2021-12-28 ENCOUNTER — Other Ambulatory Visit: Payer: Self-pay

## 2021-12-28 ENCOUNTER — Telehealth: Payer: Self-pay | Admitting: Physician Assistant

## 2021-12-28 ENCOUNTER — Emergency Department (HOSPITAL_COMMUNITY)
Admission: EM | Admit: 2021-12-28 | Discharge: 2021-12-28 | Disposition: A | Discharge status: Home / Self Care | Payer: Commercial Managed Care - HMO | Attending: Student | Admitting: Student

## 2021-12-28 ENCOUNTER — Encounter (HOSPITAL_COMMUNITY): Payer: Self-pay | Admitting: Emergency Medicine

## 2021-12-28 DIAGNOSIS — F17210 Nicotine dependence, cigarettes, uncomplicated: Secondary | ICD-10-CM | POA: Insufficient documentation

## 2021-12-28 DIAGNOSIS — M545 Low back pain, unspecified: Secondary | ICD-10-CM | POA: Diagnosis present

## 2021-12-28 DIAGNOSIS — M25551 Pain in right hip: Secondary | ICD-10-CM | POA: Insufficient documentation

## 2021-12-28 DIAGNOSIS — I10 Essential (primary) hypertension: Secondary | ICD-10-CM | POA: Insufficient documentation

## 2021-12-28 DIAGNOSIS — Z760 Encounter for issue of repeat prescription: Secondary | ICD-10-CM | POA: Insufficient documentation

## 2021-12-28 DIAGNOSIS — G8929 Other chronic pain: Secondary | ICD-10-CM | POA: Diagnosis not present

## 2021-12-28 NOTE — ED Provider Notes (Signed)
[facility] EMERGENCY DEPARTMENT Provider Note   CSN: [id number] Arrival date & time: 12/28/21  1242     History  Chief Complaint  Patient presents with   Medication Refill    Grant Galvan is a 61 y.o. male.   Medication Refill   61 year old male presents emergency department with complaints of low back pain and bilateral hip pain.  Patient states that he has had the symptoms for past several years due to to known compression fractures of L1 and L3 and severe degenerative changes of right hip.  Patient states that he had been getting his pain managed while in [place] at the pain clinic and orthopedic.  He is adamantly opposed to surgical intervention of known osteoarthritis.  He reports establishing care here in [place] at a primary care but does not have an appointment until the 16th of next month and is requesting a refill of pain medication.  Patient states that he was sent to the ER by his primary care to request medication refill.  He states taking Aleve and other NSAIDs at home with some relief of symptoms.  Denies any recent trauma to affected back or hip.  Denies fever, saddle anesthesia, bowel/bladder dysfunction, weakness/sensory deficits in lower extremities from baseline.  Patient states that he has some bilateral numbness in toes secondary to known neuropathy, history of IV drug use, no malignancy, chronic steroid use.  Symptoms not increased in intensity.  Past medical history significant for degenerative disc disease, osteoarthritis, hypertension, chronic right hip pain.  Home Medications Prior to Admission medications   Medication Sig Start Date End Date Taking? Authorizing Provider  albuterol (VENTOLIN HFA) 108 (90 Base) MCG/ACT inhaler Inhale 2 puffs into the lungs every 6 (six) hours as needed for wheezing or shortness of breath. 12/10/21   Jacquelin Hawking, PA-C  clotrimazole-betamethasone (LOTRISONE) cream Apply 1 Application topically 2 (two) times daily.  11/29/21   Jacquelin Hawking, PA-C  fluticasone-salmeterol (ADVAIR) 100-50 MCG/ACT AEPB Inhale 1 puff into the lungs 2 (two) times daily. 12/10/21   Jacquelin Hawking, PA-C      Allergies    Celecoxib, Cyclobenzaprine hcl, and Tramadol    Review of Systems   Review of Systems  All other systems reviewed and are negative.   Physical Exam Updated Vital Signs BP (!) 153/97 (BP Location: Right Arm)   Pulse 93   Temp 98.1 F (36.7 C) (Oral)   Resp 18   Ht 6\' 3"  (1.905 m)   Wt 72.6 kg   SpO2 94%   BMI 20.00 kg/m  Physical Exam Vitals and nursing note reviewed.  Constitutional:      General: He is not in acute distress.    Appearance: He is well-developed.  HENT:     Head: Normocephalic and atraumatic.  Eyes:     Conjunctiva/sclera: Conjunctivae normal.  Cardiovascular:     Rate and Rhythm: Normal rate and regular rhythm.     Heart sounds: No murmur heard. Pulmonary:     Effort: Pulmonary effort is normal. No respiratory distress.     Breath sounds: Normal breath sounds.  Abdominal:     Palpations: Abdomen is soft.     Tenderness: There is no abdominal tenderness.  Musculoskeletal:        General: No swelling.     Cervical back: Neck supple.     Comments: Patient has midline tenderness to lumbar spine with no overlying skin abnormalities noted.  No obvious step-off or deformity noted.  Patient has tenderness to  palpation of right femoral head with associated limited range of motion secondary to pain.  Muscle strength 5 out of 5 for lower extremities.  Posterior tibial pulses full and intact bilaterally.  Skin:    General: Skin is warm and dry.     Capillary Refill: Capillary refill takes less than 2 seconds.  Neurological:     Mental Status: He is alert.     Deep Tendon Reflexes: Reflexes are normal and symmetric.  Psychiatric:        Mood and Affect: Mood normal.     ED Results / Procedures / Treatments   Labs (all labs ordered are listed, but only abnormal results  are displayed) Labs Reviewed - No data to display  EKG None  Radiology No results found.  Procedures Procedures    Medications Ordered in ED Medications - No data to display  ED Course/ Medical Decision Making/ A&P                           Medical Decision Making  This patient presents to the ED for concern of medication refill, this involves an extensive number of treatment options, and is a complaint that carries with it a high risk of complications and morbidity.  The differential diagnosis includes fracture, dislocation, cauda equina, spinal epidural abscess, medication refill   Co morbidities that complicate the patient evaluation  See HPI   Additional history obtained:  Additional history obtained from EMR External records from outside source obtained and reviewed including prior imaging performed on 12/17/2020 of lumbar spine and right hip   Lab Tests:  N/a   Imaging Studies ordered:  N/a   Cardiac Monitoring: / EKG:  The patient was maintained on a cardiac monitor.  I personally viewed and interpreted the cardiac monitored which showed an underlying rhythm of: Sinus rhythm   Consultations Obtained:  N/a   Problem List / ED Course / Critical interventions / Medication management  Chronic low back and right hip pain Offered ordering medication while emergency department patient declined after discussion about inappropriateness of ED to manage chronic pain outpatient given that patient has not been on some medication for some time.  Patient declined medicine while in the emergency department. Reevaluation of the patient showed that the patient stayed the same I have reviewed the patients home medicines and have made adjustments as needed   Social Determinants of Health:  Chronic cigarette use.  Denies illicit drug use   Test / Admission - Considered:  Chronic right hip and low back pain Vitals signs significant for hypertension with blood  pressure 153/97.  Recommend close follow-up with PCP regarding elevation of blood pressure.. Otherwise within normal range and stable throughout visit. Repeat imaging studies deemed unnecessary at this time due to lack of repeat trauma.  Patient symptoms likely secondary to chronic pain.  He exhibits no signs of red flags concerning for back pain.  Patient still ambulates with a cane.  Given the fact that patient has not had prescription of pain medication for some time as well as suspicious story regarding sent by PCP for medication refill with no documentation from primary care regarding this request, pain medication was not sent into patient's pharmacy.  After this discussion was had, patient declined any further intervention while in the emergency department to treat patient's pain as well as other options outpatient besides opioids for pain management.  I recommend close follow-up with PCP as well as pain  clinic for patient's symptoms.  Treatment plan discussed at length with patient he acknowledged understanding and was agreeable to said plan. Worrisome signs and symptoms were discussed with the patient, and the patient acknowledged understanding to return to the ED if noticed. Patient was stable upon discharge.          Final Clinical Impression(s) / ED Diagnoses Final diagnoses:  Medication refill  Chronic right hip pain  Chronic midline low back pain, unspecified whether sciatica present    Rx / DC Orders ED Discharge Orders     None         Peter Garter, [place] 12/28/21 1431    Glendora Score, MD 12/30/21 1148

## 2021-12-28 NOTE — ED Notes (Signed)
Per registration pt was upset and left AMA. Was unable to give pt discharge paperwork before leaving.

## 2021-12-28 NOTE — ED Triage Notes (Signed)
Pt reports he is in need of pain medication refill, states he is Rx'ed oxycodone 10mg  q 6hr;  seen by Dr. Edilia Bo with [facility] and is unable to get an appt to be seen until 01/14/22 so they referred him to the ER, pt states pain med is due to both hips being collapsed and his back being broken 4 times

## 2021-12-28 NOTE — Telephone Encounter (Signed)
Patient made transfer of care appt for 10/16. He went to the ED today for pain and the doctor "talked at him and not to him" and he wasn't listening so he ended up leaving. He said he is in extreme pain and hasn't slept in 4-5 nights. Patient wants to know if Dr. Lorelei Pont can prescribe him something for pain. He said he has been prescribed oxycodone in the past. Patient would like a call back today. Advised patient he may not be given any prescriptions prior to being seen but patient still asked that I check with Dr. Lorelei Pont.

## 2021-12-28 NOTE — Telephone Encounter (Signed)
Okay to schedule

## 2021-12-28 NOTE — Telephone Encounter (Signed)
Patient's wife called back and was informed of Copland's message.

## 2021-12-28 NOTE — Telephone Encounter (Signed)
Pt scheduled  

## 2021-12-28 NOTE — Telephone Encounter (Signed)
Please see below.

## 2021-12-31 ENCOUNTER — Other Ambulatory Visit: Payer: Self-pay

## 2021-12-31 ENCOUNTER — Encounter (HOSPITAL_COMMUNITY): Payer: Self-pay

## 2021-12-31 ENCOUNTER — Emergency Department (HOSPITAL_COMMUNITY)
Admission: EM | Admit: 2021-12-31 | Discharge: 2021-12-31 | Discharge status: Against Medical Advice | Payer: Commercial Managed Care - HMO | Attending: Emergency Medicine | Admitting: Emergency Medicine

## 2021-12-31 DIAGNOSIS — M545 Low back pain, unspecified: Secondary | ICD-10-CM | POA: Insufficient documentation

## 2021-12-31 DIAGNOSIS — Z5321 Procedure and treatment not carried out due to patient leaving prior to being seen by health care provider: Secondary | ICD-10-CM | POA: Insufficient documentation

## 2021-12-31 NOTE — ED Triage Notes (Signed)
Pt to er, pt states that he has a hx of fractured back, and "collapsed hip" pt states that he just moved up from [place], states that he called his pmd and they told him to go to the er to get pain medication.  Pt states that he is normally taking 20mg  of oxycodone every 4 hrs.

## 2022-01-12 NOTE — Progress Notes (Addendum)
[place] at [facility] [address], [place], [place] [address] [phone]  Date:  01/14/2022   Name:  Grant Galvan   DOB:  1961-03-29   MRN:  [id number]  PCP:  Darreld Mclean, MD    Chief Complaint: Transitions Of Care (Concerns/ questions: pt says the Albuterol does not help and the Advair makes his chest feel heavy. 2. Refill on BP and pain meds, 3. Would like a neb machine/Colon: none yet/HIV/ Hep C screen due/Tdap, Shringrix : NO [facility])   History of Present Illness:  Grant Galvan is a 61 y.o. very pleasant male patient who presents with the following:  Seen today to reestablish care Last seen by myself in 2019- History of chronic pain, here today for a refill of his medications. 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 No history of glaucoma  He states he was also dx with osteoporosis and "both my hips are collapsed"- he is not sure if this might mean avascular necrosis or just severe arthritis. He is not interested in consideration of hip replacement at this time  "Dr Heath Gold" was taking care of him in [place]- we will request records.  He needs to get more details about this doctor's name and location from home, so I can look him up and request records  Aboubacar has also been treated for chronic pain in the past.  He states that most recently-when he was under regular care-he was taking oxycodone 10 every 4 hours He has been taking it  sporadically for the last couple of years; states he takes just a few pills here and there as he does not have a regular physician  Advised patient that I can treat him for chronic pain, but I will need to get a UDS and some more information about his previous treatment.  Since he has not been treated regularly we decided to try Oxycodone 5 every 8 hours when he does go back on meds  Ch[place]udi has not had any blood work or immunizations.  He states he is severely afraid of needles.  I advised him that if we are going to continue treatment we really need to get some basic labs, at least assess his liver and kidney function. I prescribed him with alprazolam, advised him to take 1 about 30 minutes before blood draw and have his wife drive him.  He is going to think about this  Not currently taking his blood pressure medication.  He was previously on amlodipine, perhaps some other medications but he is not sure.  Since we cannot get any lab work today we will have him start on amlodipine 5 mg.  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    No past surgical history on file.  Social History   Tobacco Use   Smoking status: Every Day    Packs/day: 0.50    Types: Cigarettes   Smokeless tobacco: Former  Scientific laboratory technician Use: Never used  Substance Use Topics   Alcohol use: No    Comment: gave up alcohol 1995.  hx heavy   Drug use: Not Currently    Family  History  Problem Relation Age of Onset   Cancer Mother    Stroke Mother    COPD Father    Cancer Father 21       prostate   Cancer Brother 18       colon    Allergies  Allergen Reactions   Celecoxib Other (See Comments)    Stopped breathing   Cyclobenzaprine Hcl Hives   Tramadol Diarrhea    Medication list has been reviewed and updated.  Current Outpatient Medications on File Prior to Visit  Medication Sig Dispense Refill   albuterol (VENTOLIN HFA) 108 (90 Base) MCG/ACT inhaler Inhale 2 puffs into the lungs every 6 (six) hours as needed for wheezing or shortness of breath. 3 each 0   clotrimazole-betamethasone (LOTRISONE) cream Apply 1 Application topically 2 (two) times daily. 45 g 0   fluticasone-salmeterol (ADVAIR) 100-50 MCG/ACT AEPB Inhale 1 puff into the lungs 2 (two) times daily. 3 each 0   oxyCODONE-acetaminophen (PERCOCET) 10-325 MG tablet Take by mouth.     triamcinolone cream (KENALOG) 0.5 % Apply topically.     No current facility-administered medications on file prior to visit.    Review of Systems:  As per HPI- otherwise negative.   Physical Examination: Vitals:   01/14/22 1426  BP: (!) 180/98  Pulse: 85  Resp: 18  Temp: 97.8 F (36.6 C)  SpO2: 91%   Vitals:   01/14/22 1426  Weight: 159 lb 3.2 oz (72.2 kg)  Height: 6\' 2"  (1.88 m)   Body mass index is 20.44 kg/m. Ideal Body Weight: Weight in (lb) to have BMI = 25: 194.3  GEN: no acute distress.  Tall build, thin.  Smoker HEENT: Atraumatic, Normocephalic.  Ears and Nose: No external deformity. CV: RRR, No M/G/R. No JVD. No thrill. No extra heart sounds. PULM: CTA B, no wheezes, crackles, rhonchi. No retractions. No resp. distress. No accessory muscle use. ABD: S, NT, ND, +BS. No rebound. No HSM. EXTR: No c/c/e PSYCH: Normally interactive. Conversant.    Assessment and Plan: Chronic obstructive pulmonary disease, unspecified COPD type ([place]) - Plan: tiotropium (SPIRIVA) 18 MCG inhalation  capsule, DG Chest 2 View, ipratropium-albuterol (DUONEB) 0.5-2.5 (3) MG/3ML SOLN, albuterol (VENTOLIN HFA) 108 (90 Base) MCG/ACT inhaler  Primary hypertension - Plan: CBC, Comprehensive metabolic panel, amLODipine (NORVASC) 5 MG tablet  Screening for diabetes mellitus - Plan: Hemoglobin A1c  Screening for hyperlipidemia - Plan: Lipid panel  Other chronic pain - Plan: DRUG MONITORING, PANEL 8 WITH CONFIRMATION, URINE, DG Hip Unilat W OR W/O Pelvis 2-3 Views Left, DG Hip Unilat W OR W/O Pelvis 2-3 Views Right  Screening for  prostate cancer - Plan: PSA  Needle phobia - Plan: ALPRAZolam (XANAX) 1 MG tablet  Encounter for screening for lung cancer - Plan: CT CHEST LUNG CA SCREEN LOW DOSE W/O CM  Patient is seen today to reestablish care after a several year absence. He is a long-term smoker, now diagnosed with COPD.  Prescribed Spiriva for him to try, also DuoNeb.  Refilled his as needed albuterol Ordered a chest x-ray and lung cancer screening CT He notes chronic pain due to his back and also hips.  Advised I will need to get a drug screen at the very least prior to restarting treatment.  UDS is pending today Patient agrees to provide me with his previous physician's contact information so I can obtain records Ordered x-rays of both hips to assess joints Ordered routine lab work, patient states he will think about trying to get his blood drawn-he is adamantly opposed to any blood work today.  This does limit my choices as far as blood pressure medication Start back on amlodipine 5 mg for blood pressure for now-will need to follow-up and recheck soon. 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

## 2022-01-14 ENCOUNTER — Ambulatory Visit (INDEPENDENT_AMBULATORY_CARE_PROVIDER_SITE_OTHER): Payer: Commercial Managed Care - HMO | Admitting: Family Medicine

## 2022-01-14 VITALS — BP 180/98 | HR 85 | Temp 97.8°F | Resp 18 | Ht 74.0 in | Wt 159.2 lb

## 2022-01-14 DIAGNOSIS — Z122 Encounter for screening for malignant neoplasm of respiratory organs: Secondary | ICD-10-CM

## 2022-01-14 DIAGNOSIS — Z13220 Encounter for screening for lipoid disorders: Secondary | ICD-10-CM

## 2022-01-14 DIAGNOSIS — G8929 Other chronic pain: Secondary | ICD-10-CM

## 2022-01-14 DIAGNOSIS — Z125 Encounter for screening for malignant neoplasm of prostate: Secondary | ICD-10-CM

## 2022-01-14 DIAGNOSIS — I10 Essential (primary) hypertension: Secondary | ICD-10-CM

## 2022-01-14 DIAGNOSIS — F40298 Other specified phobia: Secondary | ICD-10-CM

## 2022-01-14 DIAGNOSIS — J449 Chronic obstructive pulmonary disease, unspecified: Secondary | ICD-10-CM | POA: Diagnosis not present

## 2022-01-14 DIAGNOSIS — Z131 Encounter for screening for diabetes mellitus: Secondary | ICD-10-CM | POA: Diagnosis not present

## 2022-01-14 MED ORDER — ALBUTEROL SULFATE HFA 108 (90 BASE) MCG/ACT IN AERS: 2.0000 | INHALATION_SPRAY | Freq: Four times a day (QID) | RESPIRATORY_TRACT | 0 refills | Status: DC | PRN

## 2022-01-14 MED ORDER — IPRATROPIUM-ALBUTEROL 0.5-2.5 (3) MG/3ML IN SOLN: 3.0000 mL | Freq: Four times a day (QID) | RESPIRATORY_TRACT | 1 refills | Status: DC | PRN

## 2022-01-14 MED ORDER — ALPRAZOLAM 1 MG PO TABS: ORAL_TABLET | ORAL | 0 refills | Status: DC

## 2022-01-14 MED ORDER — TIOTROPIUM BROMIDE MONOHYDRATE 18 MCG IN CAPS: 18.0000 ug | ORAL_CAPSULE | Freq: Every day | RESPIRATORY_TRACT | 12 refills | Status: DC

## 2022-01-14 MED ORDER — AMLODIPINE BESYLATE 5 MG PO TABS: 5.0000 mg | ORAL_TABLET | Freq: Every day | ORAL | 3 refills | Status: DC

## 2022-01-14 NOTE — Patient Instructions (Addendum)
Good to see you today!   Please stop by the lab to do your urine drug screen today Once I have this back we can get you started on oxycodone.   Please also help me get in touch with your doctor from [place]  At your convenience please go to [place] at [facility] for x-rays of your chest and hips  I would really like to get some labs for you!   Consider using the alprazolam for anxiety prior to your blood draw- please get Darlene to drive you in this case  For COPD- let's have you try Spiriva/ ipratropium inhaler once a day Can use the albuterol inhaler or the nebulizer also as needed Ok to buy a nebullizer on [place]!   Start on amlodipine 5 mg daily for your BP for now

## 2022-01-15 ENCOUNTER — Telehealth: Payer: Self-pay | Admitting: Family Medicine

## 2022-01-15 ENCOUNTER — Encounter: Payer: Self-pay | Admitting: Family Medicine

## 2022-01-15 DIAGNOSIS — I10 Essential (primary) hypertension: Secondary | ICD-10-CM

## 2022-01-15 DIAGNOSIS — F40298 Other specified phobia: Secondary | ICD-10-CM

## 2022-01-15 DIAGNOSIS — J449 Chronic obstructive pulmonary disease, unspecified: Secondary | ICD-10-CM

## 2022-01-15 DIAGNOSIS — M25559 Pain in unspecified hip: Secondary | ICD-10-CM

## 2022-01-15 NOTE — Telephone Encounter (Signed)
Patient's wife states her husband's insurance is not wanting to cover the tiotropium (SPIRIVA) 18 MCG inhalation capsule  medication. She states that they told them that they would like for the PCP to try another type of medication. Please advise.

## 2022-01-15 NOTE — Telephone Encounter (Signed)
FYI: and nothing has come through for a PA

## 2022-01-16 ENCOUNTER — Telehealth (HOSPITAL_BASED_OUTPATIENT_CLINIC_OR_DEPARTMENT_OTHER): Payer: Self-pay

## 2022-01-16 ENCOUNTER — Telehealth: Payer: Self-pay

## 2022-01-16 ENCOUNTER — Telehealth: Payer: Self-pay | Admitting: Family Medicine

## 2022-01-16 ENCOUNTER — Encounter: Payer: Self-pay | Admitting: Family Medicine

## 2022-01-16 DIAGNOSIS — M25559 Pain in unspecified hip: Secondary | ICD-10-CM

## 2022-01-16 LAB — DRUG MONITORING, PANEL 8 WITH CONFIRMATION, URINE
6 Acetylmorphine: NEGATIVE ng/mL (ref <10)
Alcohol Metabolites: NEGATIVE ng/mL (ref <500)
Amphetamines: NEGATIVE ng/mL (ref <500)
Benzodiazepines: NEGATIVE ng/mL (ref <100)
Buprenorphine, Urine: NEGATIVE ng/mL (ref <5)
Cocaine Metabolite: NEGATIVE ng/mL (ref <150)
Codeine: NEGATIVE ng/mL (ref <50)
Creatinine: 33.5 mg/dL (ref >=20.0)
Hydrocodone: NEGATIVE ng/mL (ref <50)
Hydromorphone: NEGATIVE ng/mL (ref <50)
MDMA: NEGATIVE ng/mL (ref <500)
Marijuana Metabolite: 195 ng/mL — ABNORMAL HIGH (ref <5)
Marijuana Metabolite: POSITIVE ng/mL — AB (ref <20)
Morphine: NEGATIVE ng/mL (ref <50)
Norhydrocodone: NEGATIVE ng/mL (ref <50)
Noroxycodone: 6804 ng/mL — ABNORMAL HIGH (ref <50)
Opiates: NEGATIVE ng/mL (ref <100)
Oxidant: NEGATIVE ug/mL (ref <200)
Oxycodone: 5299 ng/mL — ABNORMAL HIGH (ref <50)
Oxycodone: POSITIVE ng/mL — AB (ref <100)
Oxymorphone: 3211 ng/mL — ABNORMAL HIGH (ref <50)
pH: 6.9 (ref 4.5–9.0)

## 2022-01-16 LAB — DM TEMPLATE

## 2022-01-16 MED ORDER — ALPRAZOLAM 1 MG PO TABS: ORAL_TABLET | ORAL | 0 refills | Status: DC

## 2022-01-16 MED ORDER — OXYCODONE HCL 5 MG PO TABS: 2.5000 mg | ORAL_TABLET | Freq: Three times a day (TID) | ORAL | 0 refills | Status: DC | PRN

## 2022-01-16 MED ORDER — IPRATROPIUM-ALBUTEROL 0.5-2.5 (3) MG/3ML IN SOLN: 3.0000 mL | Freq: Four times a day (QID) | RESPIRATORY_TRACT | 1 refills | Status: DC | PRN

## 2022-01-16 MED ORDER — OXYCODONE-ACETAMINOPHEN 5-325 MG PO TABS: 0.5000 | ORAL_TABLET | Freq: Three times a day (TID) | ORAL | 0 refills | Status: DC | PRN

## 2022-01-16 MED ORDER — ALBUTEROL SULFATE HFA 108 (90 BASE) MCG/ACT IN AERS: 2.0000 | INHALATION_SPRAY | Freq: Four times a day (QID) | RESPIRATORY_TRACT | 0 refills | Status: DC | PRN

## 2022-01-16 MED ORDER — INCRUSE ELLIPTA 62.5 MCG/ACT IN AEPB: 1.0000 | INHALATION_SPRAY | Freq: Every day | RESPIRATORY_TRACT | 6 refills | Status: DC

## 2022-01-16 MED ORDER — AMLODIPINE BESYLATE 5 MG PO TABS: 5.0000 mg | ORAL_TABLET | Freq: Every day | ORAL | 3 refills | Status: DC

## 2022-01-16 NOTE — Telephone Encounter (Signed)
Patient and his wife called stating the pharmacy has sent a PA to [place] multiple times and the insurance needs it back. She would like for the PA to be sent to the insurance. Please advise.

## 2022-01-16 NOTE — Telephone Encounter (Signed)
Called [facility] and they had the wrong fax number, they have update it and sent to [place]. Left a message on the number documented In the chart.

## 2022-01-16 NOTE — Telephone Encounter (Signed)
error 

## 2022-01-16 NOTE — Telephone Encounter (Signed)
Pt called stating that he had talked to the pharmacy about getting his oxycodone that was sent in by Dr. Lorelei Pont. His pharmacy told him that insurance needed authorization on why the Rx was written this way and that they had faxed over a form for Dr. Lorelei Pont to fill out. As of 4:56p, there was no fax that seemed to be associated with this issue. After speaking with Kristine Garbe, advised pt that due to the medication being a controlled substance and regarding script details, that would have to be facilitated by Dr. Lorelei Pont and she had left for the day so this would have to be done tomorrow. Pt acknowledged understanding.

## 2022-01-16 NOTE — Telephone Encounter (Signed)
See below

## 2022-01-17 NOTE — Telephone Encounter (Signed)
Called pharmacy, he actually just need a prior authorization which we have taken care of

## 2022-01-17 NOTE — Telephone Encounter (Signed)
InitiatedGwyndolyn Saxon t [place]: [id number] - PA Case ID: [id number] - Rx #: [id number]

## 2022-01-18 NOTE — Telephone Encounter (Signed)
Approved on October 19 CaseId:[id number];Status:Approved;Review Type:Prior Auth;Coverage Start Date:01/17/2022;Coverage End Date:01/17/2023;   - Spiriva was changed to alt therapy so PA was not needed.

## 2022-01-21 ENCOUNTER — Encounter: Payer: Self-pay | Admitting: Family Medicine

## 2022-01-21 ENCOUNTER — Telehealth: Payer: Self-pay | Admitting: Family Medicine

## 2022-01-21 DIAGNOSIS — M25559 Pain in unspecified hip: Secondary | ICD-10-CM

## 2022-01-21 NOTE — Telephone Encounter (Signed)
[employer] PA dept wanted to let us know CT scan of lungs has been approved, ref # [id number]. PA code [id number]. Any questions call [phone], Alma Downs.

## 2022-01-21 NOTE — Telephone Encounter (Signed)
See below

## 2022-01-21 NOTE — Telephone Encounter (Signed)
Pt asks for another pain med. Please advise.

## 2022-01-22 MED ORDER — OXYCODONE HCL 5 MG PO TABS: 2.5000 mg | ORAL_TABLET | Freq: Three times a day (TID) | ORAL | 0 refills | Status: DC | PRN

## 2022-01-22 MED ORDER — GABAPENTIN 300 MG PO CAPS: 300.0000 mg | ORAL_CAPSULE | Freq: Three times a day (TID) | ORAL | 3 refills | Status: DC

## 2022-01-22 NOTE — Addendum Note (Signed)
Addended by: Lamar Blinks C on: 01/22/2022 01:10 PM   Modules accepted: Orders

## 2022-01-23 ENCOUNTER — Telehealth: Payer: Self-pay | Admitting: Family Medicine

## 2022-01-23 NOTE — Telephone Encounter (Signed)
Pt requested to speak with Kristine Garbe about an issue with obtaining medical records from previous dr.

## 2022-01-23 NOTE — Telephone Encounter (Signed)
Pt says the pain specialist that he was seeing: Neysa Bonito is now shut completley down. But Dexa came from [facility] fax: [phone] (can send the request). He says he saw a Provider with the last name of "Ward Chatters" as primary care phone number: [phone]. (I can send request for this as well.)

## 2022-01-25 ENCOUNTER — Encounter: Payer: Self-pay | Admitting: *Deleted

## 2022-01-28 NOTE — Telephone Encounter (Signed)
Records request sent to Dr Rene Kocher  [address], [place] [address] [phone]

## 2022-02-10 ENCOUNTER — Telehealth: Payer: Self-pay | Admitting: Family Medicine

## 2022-02-10 NOTE — Telephone Encounter (Signed)
Received some limited records from his MD in [place] and also the name of his pharmacy in [place]- will request pharmacy records

## 2022-02-14 ENCOUNTER — Encounter: Payer: Self-pay | Admitting: Family Medicine

## 2022-02-14 ENCOUNTER — Observation Stay (HOSPITAL_COMMUNITY)
Admission: EM | Admit: 2022-02-14 | Discharge: 2022-02-14 | Discharge status: Against Medical Advice | Payer: Commercial Managed Care - HMO | Attending: Family Medicine | Admitting: Family Medicine

## 2022-02-14 ENCOUNTER — Encounter (HOSPITAL_COMMUNITY): Payer: Self-pay | Admitting: Emergency Medicine

## 2022-02-14 ENCOUNTER — Emergency Department (HOSPITAL_COMMUNITY): Payer: Commercial Managed Care - HMO

## 2022-02-14 ENCOUNTER — Other Ambulatory Visit: Payer: Self-pay

## 2022-02-14 ENCOUNTER — Telehealth (HOSPITAL_BASED_OUTPATIENT_CLINIC_OR_DEPARTMENT_OTHER): Payer: Self-pay

## 2022-02-14 DIAGNOSIS — Z8679 Personal history of other diseases of the circulatory system: Secondary | ICD-10-CM | POA: Diagnosis present

## 2022-02-14 DIAGNOSIS — F17210 Nicotine dependence, cigarettes, uncomplicated: Secondary | ICD-10-CM | POA: Insufficient documentation

## 2022-02-14 DIAGNOSIS — S32010A Wedge compression fracture of first lumbar vertebra, initial encounter for closed fracture: Secondary | ICD-10-CM | POA: Insufficient documentation

## 2022-02-14 DIAGNOSIS — I10 Essential (primary) hypertension: Secondary | ICD-10-CM

## 2022-02-14 DIAGNOSIS — J441 Chronic obstructive pulmonary disease with (acute) exacerbation: Secondary | ICD-10-CM | POA: Diagnosis not present

## 2022-02-14 DIAGNOSIS — R0602 Shortness of breath: Principal | ICD-10-CM | POA: Insufficient documentation

## 2022-02-14 DIAGNOSIS — F40298 Other specified phobia: Secondary | ICD-10-CM | POA: Diagnosis present

## 2022-02-14 DIAGNOSIS — Z79899 Other long term (current) drug therapy: Secondary | ICD-10-CM | POA: Diagnosis not present

## 2022-02-14 DIAGNOSIS — J449 Chronic obstructive pulmonary disease, unspecified: Secondary | ICD-10-CM

## 2022-02-14 DIAGNOSIS — M87051 Idiopathic aseptic necrosis of right femur: Secondary | ICD-10-CM | POA: Insufficient documentation

## 2022-02-14 DIAGNOSIS — Z720 Tobacco use: Secondary | ICD-10-CM | POA: Diagnosis present

## 2022-02-14 HISTORY — DX: Chronic obstructive pulmonary disease, unspecified: J44.9

## 2022-02-14 LAB — RAPID URINE DRUG SCREEN, HOSP PERFORMED
Amphetamines: NOT DETECTED
Barbiturates: NOT DETECTED
Benzodiazepines: NOT DETECTED
Cocaine: NOT DETECTED
Opiates: NOT DETECTED
Tetrahydrocannabinol: POSITIVE — AB

## 2022-02-14 MED ORDER — PREDNISONE 50 MG PO TABS
50.0000 mg | ORAL_TABLET | Freq: Once | ORAL | Status: AC
  Administered 2022-02-14: 50 mg via ORAL
  Filled 2022-02-14: qty 1

## 2022-02-14 MED ORDER — DM-GUAIFENESIN ER 30-600 MG PO TB12
1.0000 | ORAL_TABLET | Freq: Once | ORAL | Status: AC
  Administered 2022-02-14: 1 via ORAL
  Filled 2022-02-14: qty 1

## 2022-02-14 MED ORDER — ALBUTEROL SULFATE (2.5 MG/3ML) 0.083% IN NEBU: 2.5000 mg | INHALATION_SOLUTION | RESPIRATORY_TRACT | Status: DC | PRN

## 2022-02-14 MED ORDER — PREDNISONE 20 MG PO TABS: 20.0000 mg | ORAL_TABLET | ORAL | 0 refills | Status: AC

## 2022-02-14 MED ORDER — FLUTICASONE-SALMETEROL 100-50 MCG/ACT IN AEPB: 1.0000 | INHALATION_SPRAY | Freq: Two times a day (BID) | RESPIRATORY_TRACT | 5 refills | Status: DC

## 2022-02-14 MED ORDER — METHYLPREDNISOLONE SODIUM SUCC 125 MG IJ SOLR: 125.0000 mg | Freq: Once | INTRAMUSCULAR | Status: DC

## 2022-02-14 MED ORDER — DOXYCYCLINE HYCLATE 100 MG PO TABS: 100.0000 mg | ORAL_TABLET | Freq: Two times a day (BID) | ORAL | 0 refills | Status: AC

## 2022-02-14 MED ORDER — ALBUTEROL SULFATE HFA 108 (90 BASE) MCG/ACT IN AERS: 2.0000 | INHALATION_SPRAY | Freq: Four times a day (QID) | RESPIRATORY_TRACT | 5 refills | Status: DC | PRN

## 2022-02-14 MED ORDER — GUAIFENESIN ER 600 MG PO TB12: 600.0000 mg | ORAL_TABLET | Freq: Two times a day (BID) | ORAL | 2 refills | Status: AC

## 2022-02-14 MED ORDER — DM-GUAIFENESIN ER 30-600 MG PO TB12: 1.0000 | ORAL_TABLET | Freq: Two times a day (BID) | ORAL | Status: DC

## 2022-02-14 MED ORDER — ALBUTEROL SULFATE (2.5 MG/3ML) 0.083% IN NEBU
10.0000 mg/h | INHALATION_SOLUTION | RESPIRATORY_TRACT | Status: AC
  Administered 2022-02-14: 10 mg/h via RESPIRATORY_TRACT
  Filled 2022-02-14: qty 12

## 2022-02-14 MED ORDER — IPRATROPIUM-ALBUTEROL 0.5-2.5 (3) MG/3ML IN SOLN: 3.0000 mL | Freq: Four times a day (QID) | RESPIRATORY_TRACT | 3 refills | Status: DC | PRN

## 2022-02-14 MED ORDER — IPRATROPIUM-ALBUTEROL 0.5-2.5 (3) MG/3ML IN SOLN: 3.0000 mL | Freq: Four times a day (QID) | RESPIRATORY_TRACT | Status: DC

## 2022-02-14 MED ORDER — IPRATROPIUM-ALBUTEROL 0.5-2.5 (3) MG/3ML IN SOLN
3.0000 mL | RESPIRATORY_TRACT | Status: AC
  Administered 2022-02-14 (×3): 3 mL via RESPIRATORY_TRACT
  Filled 2022-02-14 (×2): qty 3

## 2022-02-14 MED ORDER — PREDNISONE 50 MG PO TABS
60.0000 mg | ORAL_TABLET | Freq: Once | ORAL | Status: AC
  Administered 2022-02-14: 60 mg via ORAL
  Filled 2022-02-14: qty 1

## 2022-02-14 MED ORDER — AMLODIPINE BESYLATE 5 MG PO TABS: 5.0000 mg | ORAL_TABLET | Freq: Every day | ORAL | 3 refills | Status: DC

## 2022-02-14 MED ORDER — INCRUSE ELLIPTA 62.5 MCG/ACT IN AEPB: 1.0000 | INHALATION_SPRAY | Freq: Every day | RESPIRATORY_TRACT | 6 refills | Status: DC

## 2022-02-14 MED ORDER — NICOTINE 21 MG/24HR TD PT24: 21.0000 mg | MEDICATED_PATCH | TRANSDERMAL | 0 refills | Status: AC

## 2022-02-14 MED ORDER — DOXYCYCLINE HYCLATE 100 MG PO TABS
100.0000 mg | ORAL_TABLET | Freq: Once | ORAL | Status: AC
  Administered 2022-02-14: 100 mg via ORAL
  Filled 2022-02-14: qty 1

## 2022-02-14 MED ORDER — ALBUTEROL SULFATE (2.5 MG/3ML) 0.083% IN NEBU
2.5000 mg | INHALATION_SOLUTION | Freq: Once | RESPIRATORY_TRACT | Status: AC
  Administered 2022-02-14: 2.5 mg via RESPIRATORY_TRACT
  Filled 2022-02-14: qty 3

## 2022-02-14 NOTE — ED Notes (Signed)
Shon Hale, MD and this RN discussed getting an IV for admission and pt become agitated and stated that "I does not do needles period, I will leave right now, I do not do needles, stop talking about it, I would like to leave" MD and I discussed risks of leaving AMA and pt verbalizes understanding

## 2022-02-14 NOTE — Discharge Instructions (Signed)
1)Take Prednisone 2 tablets (40 mg) daily for 5 days then 1 tablet (20 mg) daily for 5 days then STOP 2) complete abstinence from tobacco advised--May use nicotine patch to help you quit smoking 3)follow up with Copland, Gwenlyn Found, MD--- primary care physician in about 4 to 5 days for recheck and reevaluation 4) you have refused to be admitted to the hospital today, you have refused any blood test today--- you have decided to leave the hospital AGAINST MEDICAL ADVICE today--- please return or call if you change your mind

## 2022-02-14 NOTE — Progress Notes (Signed)
Breathing treatment completed.  Blankets given to patient and lights turned down at patient's request.  RN notified.

## 2022-02-14 NOTE — ED Provider Notes (Signed)
[facility] EMERGENCY DEPARTMENT Provider Note   CSN: [id number] Arrival date & time: 02/14/22  0222     History  Chief Complaint  Patient presents with   Shortness of Breath    Grant Galvan is a 61 y.o. male.  61 year old male who presents the ER today with shortness of breath.  Patient has a history of COPD but has never had pulmonary function test done.  Grant Galvan adamantly refuses to have any blood drawn or any needles come anywhere near him.  Grant Galvan states Grant Galvan is rather die or flight then have a needle near him.  Grant Galvan states that Grant Galvan is not DNR but prefers to get compressions without needles if needed.  I told him we could discuss that if we needed to.  States that over the last 1 to 2 days is a progressively worsening cough with increased mucus.  Grant Galvan is done 4-5 albuterol treatments without any improvement.  Grant Galvan states Grant Galvan does take a daily breathing treatment which appears to be Advair.  Grant Galvan has been compliant with those.  Grant Galvan has some chest tightness but is more related to his lungs and not be on get a deep breath.  No fevers.  No changes mucus color.  No lower extremity swelling.   Shortness of Breath      Home Medications Prior to Admission medications   Medication Sig Start Date End Date Taking? Authorizing Provider  albuterol (VENTOLIN HFA) 108 (90 Base) MCG/ACT inhaler Inhale 2 puffs into the lungs every 6 (six) hours as needed for wheezing or shortness of breath. 12/10/21   Jacquelin Hawking, PA-C  albuterol (VENTOLIN HFA) 108 (90 Base) MCG/ACT inhaler Inhale 2 puffs into the lungs every 6 (six) hours as needed for wheezing or shortness of breath. 01/16/22   Copland, Gwenlyn Found, MD  ALPRAZolam Prudy Feeler) 1 MG tablet Take 1/2 or 1 by mouth for anxiety 30 minutes prior to blood draw 01/16/22   Copland, Gwenlyn Found, MD  amLODipine (NORVASC) 5 MG tablet Take 1 tablet (5 mg total) by mouth daily. 01/16/22   Copland, Gwenlyn Found, MD  clotrimazole-betamethasone (LOTRISONE) cream Apply 1 Application  topically 2 (two) times daily. 11/29/21   Jacquelin Hawking, PA-C  fluticasone-salmeterol (ADVAIR) 100-50 MCG/ACT AEPB Inhale 1 puff into the lungs 2 (two) times daily. 12/10/21   Jacquelin Hawking, PA-C  gabapentin (NEURONTIN) 300 MG capsule Take 1 capsule (300 mg total) by mouth 3 (three) times daily. 01/22/22   Copland, Gwenlyn Found, MD  ipratropium-albuterol (DUONEB) 0.5-2.5 (3) MG/3ML SOLN Take 3 mLs by nebulization every 6 (six) hours as needed. 01/16/22   Copland, Gwenlyn Found, MD  oxyCODONE (ROXICODONE) 5 MG immediate release tablet Take 0.5-1 tablets (2.5-5 mg total) by mouth every 8 (eight) hours as needed for severe pain. 01/22/22   Copland, Gwenlyn Found, MD  triamcinolone cream (KENALOG) 0.5 % Apply topically.    [provider]  umeclidinium bromide (INCRUSE ELLIPTA) 62.5 MCG/ACT AEPB Inhale 1 puff into the lungs daily. 01/16/22   Copland, Gwenlyn Found, MD      Allergies    Celecoxib, Cyclobenzaprine hcl, and Tramadol    Review of Systems   Review of Systems  Respiratory:  Positive for shortness of breath.     Physical Exam Updated Vital Signs BP 117/85   Pulse 80   Temp 98.6 F (37 C) (Oral)   Resp 17   Ht 6\' 3"  (1.905 m)   Wt 72.6 kg   SpO2 100%   BMI 20.00 kg/m  Physical Exam Vitals and nursing note reviewed.  Constitutional:      Appearance: Grant Galvan is well-developed.  HENT:     Head: Normocephalic and atraumatic.  Cardiovascular:     Rate and Rhythm: Normal rate.  Pulmonary:     Effort: Pulmonary effort is normal. Tachypnea present. No respiratory distress.     Breath sounds: Decreased breath sounds and wheezing present.  Abdominal:     General: There is no distension.  Musculoskeletal:        General: Normal range of motion.     Cervical back: Normal range of motion.  Neurological:     Mental Status: Grant Galvan is alert.    ED Results / Procedures / Treatments   Labs (all labs ordered are listed, but only abnormal results are displayed) Labs Reviewed - No data to  display  EKG None  Radiology DG Chest Portable 1 View  Result Date: 02/14/2022 CLINICAL DATA:  Shortness of breath with history of COPD. EXAM: PORTABLE CHEST 1 VIEW COMPARISON:  There are no comparison studies. FINDINGS: The lungs are moderately emphysematous but clear. The sulci are sharp. There is aortic tortuosity with calcifications in the transverse segment and otherwise unremarkable mediastinum. The cardiac size is normal. There is osteopenia with mild degenerative change of the spine and slight thoracic levoscoliosis. IMPRESSION: 1. No evidence of acute chest disease. 2. Emphysema. 3. Aortic atherosclerosis and uncoiling. 4. Osteopenia. Electronically Signed   By: Almira Bar M.D.   On: 02/14/2022 03:07    Procedures .Critical Care  Performed by: Marily Memos, MD Authorized by: Marily Memos, MD   Critical care provider statement:    Critical care time (minutes):  30   Critical care was necessary to treat or prevent imminent or life-threatening deterioration of the following conditions:  Respiratory failure   Critical care was time spent personally by me on the following activities:  Development of treatment plan with patient or surrogate, discussions with consultants, evaluation of patient's response to treatment, examination of patient, ordering and review of laboratory studies, ordering and review of radiographic studies, ordering and performing treatments and interventions, pulse oximetry, re-evaluation of patient's condition and review of old charts     Medications Ordered in ED Medications  ipratropium-albuterol (DUONEB) 0.5-2.5 (3) MG/3ML nebulizer solution 3 mL (3 mLs Nebulization Given by Other 02/14/22 0256)  predniSONE (DELTASONE) tablet 60 mg (60 mg Oral Given 02/14/22 0240)    ED Course/ Medical Decision Making/ A&P                           Medical Decision Making Amount and/or Complexity of Data Reviewed Radiology: ordered.  Risk Prescription drug  management. Decision regarding hospitalization.  Patient refused to get a labs.  With his chest tightness, age, medical problems and shortness of breath generally I would want to make sure Grant Galvan did not have any cardiac issues but once again Grant Galvan adamantly denies getting any blood drawn.  For now we will just treat him with steroids and DuoNebs and get a chest x-ray and EKG.  If these are okay Grant Galvan feels 100% better the maybe we can forego cardiac testing but if Grant Galvan is not feeling much better that Grant Galvan may need to be signed out AGAINST MEDICAL ADVICE Grant Galvan is awake, alert, neurologically intact and competent to make this decision. Persistent tachypnea and decreased BS. Reengaged about blood tests for admission however Grant Galvan continues to adamantly refuse anything that involves a needle.  Discussed  with hospitalist to see if they would admit for contineud breathing treatments and they will see patient and dispo appropriately.   Final Clinical Impression(s) / ED Diagnoses Final diagnoses:  None    Rx / DC Orders ED Discharge Orders     None         Tabria Steines, Barbara Cower, MD 02/20/22 [phone]

## 2022-02-14 NOTE — Progress Notes (Addendum)
Breathing treatment completed. RN informed.

## 2022-02-14 NOTE — Consult Note (Signed)
Patient Demographics:    Grant Galvan, is a 61 y.o. male  MRN: [id number]   DOB - Aug 22, 1960  Admit Date - 02/14/2022  Outpatient Primary MD for the patient is [facility], Gwenlyn Found, MD   Assessment & Plan:   Assessment and Plan:  1) acute COPD exacerbation--- patient has a phobia for needles,, he refused saline lock -Refused blood draws -Refused admission to the hospital -Hypoxia resolved in the ED after bronchodilators -Post ambulation O2 sats 92 to 94% on room air -Patient signed out AGAINST MEDICAL ADVICE Discharge instructions as below:- 1)Take Prednisone 2 tablets (40 mg) daily for 5 days then 1 tablet (20 mg) daily for 5 days then STOP 2) complete abstinence from tobacco advised--May use nicotine patch to help you quit smoking 3)follow up with [facility], Gwenlyn Found, MD--- primary care physician in about 4 to 5 days for recheck and reevaluation 4) you have refused to be admitted to the hospital today, you have refused any blood test today--- you have decided to leave the hospital AGAINST MEDICAL ADVICE today--- please return or call if you change your mind  Dispo: The patient is from: Home              Anticipated d/c is to:  Left AMA       With History of - Reviewed by me  Past Medical History:  Diagnosis Date   COPD (chronic obstructive pulmonary disease) (HCC)    Degenerative disc disease    reports back fx x 3.      No past surgical history on file.    Chief Complaint  Patient presents with   Shortness of Breath      HPI:    Grant Galvan  is a 61 y.o. male smoker with a significant phobia for needles, DJD/chronic back pain and COPD who presents to the ED with respiratory problems and concerns for hypoxia -Denies fevers or chills -Chest x-ray without acute findings -Patient believes his  chest discomfort is related to his breathing problem/COPD Twelve-lead EKG was sinus rhythm without acute ACS type findings patient has a phobia for needles,, he refused saline lock -Refused blood draws -Refused admission to the hospital -Hypoxia resolved in the ED after bronchodilators -Post ambulation O2 sats 92 to 94% on room air -Patient signed out AGAINST MEDICAL ADVICE   Review of systems:    In addition to the HPI above,   A full Review of  Systems was done, all other systems reviewed are negative except as noted above in HPI , .    Social History:  Reviewed by me    Social History   Tobacco Use   Smoking status: Every Day    Packs/day: 0.50    Types: Cigarettes   Smokeless tobacco: Former  Substance Use Topics   Alcohol use: No    Comment: gave up alcohol 1995.  hx heavy       Family History :  Reviewed  by me    Family History  Problem Relation Age of Onset   Cancer Mother    Stroke Mother    COPD Father    Cancer Father 47       prostate   Cancer Brother 1       colon     Home Medications:   Prior to Admission medications   Medication Sig Start Date End Date Taking? Authorizing Provider  doxycycline (VIBRA-TABS) 100 MG tablet Take 1 tablet (100 mg total) by mouth 2 (two) times daily for 5 days. 02/14/22 02/19/22 Yes Marget Outten, MD  guaiFENesin (MUCINEX) 600 MG 12 hr tablet Take 1 tablet (600 mg total) by mouth 2 (two) times daily. 02/14/22 02/14/23 Yes Rozell Theiler, MD  nicotine (NICODERM CQ - DOSED IN MG/24 HOURS) 21 mg/24hr patch Place 1 patch (21 mg total) onto the skin daily for 28 days. 02/14/22 03/14/22 Yes Kweku Stankey, MD  predniSONE (DELTASONE) 20 MG tablet Take 1-2 tablets (20-40 mg total) by mouth See admin instructions for 5 days. Take 2 tablets (40 mg) daily for 5 days then 1 tablet (20 mg) daily for 5 days then STOP 02/14/22 02/19/22 Yes Gleen Ripberger, MD  albuterol (VENTOLIN HFA) 108 (90 Base) MCG/ACT inhaler Inhale 2  puffs into the lungs every 6 (six) hours as needed for wheezing or shortness of breath. 02/14/22   Roxan Hockey, MD  ALPRAZolam Duanne Moron) 1 MG tablet Take 1/2 or 1 by mouth for anxiety 30 minutes prior to blood draw 01/16/22   [facility], Gay Filler, MD  amLODipine (NORVASC) 5 MG tablet Take 1 tablet (5 mg total) by mouth daily. 02/14/22   Roxan Hockey, MD  clotrimazole-betamethasone (LOTRISONE) cream Apply 1 Application topically 2 (two) times daily. 11/29/21   Soyla Dryer, PA-C  fluticasone-salmeterol (ADVAIR) 100-50 MCG/ACT AEPB Inhale 1 puff into the lungs 2 (two) times daily. 02/14/22   Roxan Hockey, MD  gabapentin (NEURONTIN) 300 MG capsule Take 1 capsule (300 mg total) by mouth 3 (three) times daily. 01/22/22   [facility], Gay Filler, MD  ipratropium-albuterol (DUONEB) 0.5-2.5 (3) MG/3ML SOLN Take 3 mLs by nebulization every 6 (six) hours as needed. 02/14/22   Roxan Hockey, MD  oxyCODONE (ROXICODONE) 5 MG immediate release tablet Take 0.5-1 tablets (2.5-5 mg total) by mouth every 8 (eight) hours as needed for severe pain. 01/22/22   [facility], Gay Filler, MD  triamcinolone cream (KENALOG) 0.5 % Apply topically.    [provider]  umeclidinium bromide (INCRUSE ELLIPTA) 62.5 MCG/ACT AEPB Inhale 1 puff into the lungs daily. 02/14/22   Roxan Hockey, MD     Allergies:     Allergies  Allergen Reactions   Celecoxib Other (See Comments)    Stopped breathing   Cyclobenzaprine Hcl Hives   Tramadol Diarrhea     Physical Exam:   Vitals  Blood pressure (!) 144/87, pulse 92, temperature 98.7 F (37.1 C), temperature source Oral, resp. rate (!) 24, height 6\' 3"  (1.905 m), weight 72.6 kg, SpO2 98 %.  Physical Examination: General appearance - alert,  in no distress Mental status - alert, oriented to person, place, and time, very anxious Eyes - sclera anicteric Neck - supple, no JVD elevation , Chest -improved air movement after bronchodilator treatment, no significant  wheezing at this time  heart - S1 and S2 normal, regular  Abdomen - soft, nontender, nondistended, +BS Neurological - screening mental status exam normal, neck supple without rigidity, cranial nerves II through XII intact, DTR's normal and symmetric Extremities -  no pedal edema noted, intact peripheral pulses  Skin - warm, dry     Data Review:    CBC No results for input(s): "WBC", "HGB", "HCT", "PLT", "MCV", "MCH", "MCHC", "RDW", "LYMPHSABS", "MONOABS", "EOSABS", "BASOSABS", "BANDABS" in the last 168 hours.  Invalid input(s): "NEUTRABS", "BANDSABD" ------------------------------------------------------------------------------------------------------------------  Chemistries  No results for input(s): "NA", "K", "CL", "CO2", "GLUCOSE", "BUN", "CREATININE", "CALCIUM", "MG", "AST", "ALT", "ALKPHOS", "BILITOT" in the last 168 hours.  Invalid input(s): "GFRCGP" ------------------------------------------------------------------------------------------------------------------ CrCl cannot be calculated (No successful lab value found.). ------------------------------------------------------------------------------------------------------------------ No results for input(s): "TSH", "T4TOTAL", "T3FREE", "THYROIDAB" in the last 72 hours.  Invalid input(s): "FREET3"   Coagulation profile No results for input(s): "INR", "PROTIME" in the last 168 hours. ------------------------------------------------------------------------------------------------------------------- No results for input(s): "DDIMER" in the last 72 hours. -------------------------------------------------------------------------------------------------------------------  Cardiac Enzymes No results for input(s): "CKMB", "TROPONINI", "MYOGLOBIN" in the last 168 hours.  Invalid input(s): "CK" ------------------------------------------------------------------------------------------------------------------ No results found for:  "BNP"   ---------------------------------------------------------------------------------------------------------------  Urinalysis No results found for: "COLORURINE", "APPEARANCEUR", "LABSPEC", "PHURINE", "GLUCOSEU", "HGBUR", "BILIRUBINUR", "KETONESUR", "PROTEINUR", "UROBILINOGEN", "NITRITE", "LEUKOCYTESUR"  ----------------------------------------------------------------------------------------------------------------   Imaging Results:    DG Chest Portable 1 View  Result Date: 02/14/2022 CLINICAL DATA:  Shortness of breath with history of COPD. EXAM: PORTABLE CHEST 1 VIEW COMPARISON:  There are no comparison studies. FINDINGS: The lungs are moderately emphysematous but clear. The sulci are sharp. There is aortic tortuosity with calcifications in the transverse segment and otherwise unremarkable mediastinum. The cardiac size is normal. There is osteopenia with mild degenerative change of the spine and slight thoracic levoscoliosis. IMPRESSION: 1. No evidence of acute chest disease. 2. Emphysema. 3. Aortic atherosclerosis and uncoiling. 4. Osteopenia. Electronically Signed   By: Telford Nab M.D.   On: 02/14/2022 03:07    Radiological Exams on Admission: DG Chest Portable 1 View  Result Date: 02/14/2022 CLINICAL DATA:  Shortness of breath with history of COPD. EXAM: PORTABLE CHEST 1 VIEW COMPARISON:  There are no comparison studies. FINDINGS: The lungs are moderately emphysematous but clear. The sulci are sharp. There is aortic tortuosity with calcifications in the transverse segment and otherwise unremarkable mediastinum. The cardiac size is normal. There is osteopenia with mild degenerative change of the spine and slight thoracic levoscoliosis. IMPRESSION: 1. No evidence of acute chest disease. 2. Emphysema. 3. Aortic atherosclerosis and uncoiling. 4. Osteopenia. Electronically Signed   By: Telford Nab M.D.   On: 02/14/2022 03:07    Family Communication:   patients condition and plan  of care including tests being ordered have been discussed with the patient -- who indicate understanding and agree with the plan   Condition   patient left AMA in improved condition  Roxan Hockey M.D on 02/14/2022 at 9:01 AM Go to [email or web address] -  for contact info  [facility] - Office  [phone]

## 2022-02-14 NOTE — ED Triage Notes (Signed)
Pt c/o sudden onset of sob about 1-2 hours ago.

## 2022-02-15 ENCOUNTER — Encounter: Payer: Self-pay | Admitting: Family Medicine

## 2022-02-15 ENCOUNTER — Telehealth: Payer: Self-pay | Admitting: Family Medicine

## 2022-02-15 DIAGNOSIS — R0902 Hypoxemia: Secondary | ICD-10-CM

## 2022-02-15 NOTE — Telephone Encounter (Signed)
Please see discrepancy.

## 2022-02-15 NOTE — Telephone Encounter (Signed)
Patient's wife called stating her husband had to call the ambulance 4 times because he was extremely short of breath and they had told him his O2 was low with the lowest being 84. Wife stated eventually they took him to the emergency room. While the wife was speaking the patient was on the background speaking over the wife which was getting hard to understand, so then he grabbed the phone and stating that it was an emergency and he needed done ASAP. Patient continued to say that when he was in the emergency room, they told him he needed to contact PCP and get an oxygen tank immediately. Informed patient that a message would be sent to which the patient started raising his voice and stated he needed to speak to [facility]'s nurse immediatly. I informed to the patient that the CMA was currently with patient but I would send a high priority message and they would get back to him. Patient cut me off saying thank you over and over again and did not let me speak. Patient proceeded to say "mother fuckers" to which I told the patient that I was trying to help him and the profanity was unnecessary. Patient continued to say he thought he had hung up and was not talking to me. Informed patient that a message would be sent and he would receive a call then proceeded to end the call.

## 2022-02-15 NOTE — Telephone Encounter (Signed)
Called the patient back- he was in the ER yesterday with a ?COPD exacerbation but he left AMA so notes are limited.  He states he was told he needed to contact me and get oxygen for home  Spoke with [employer] in [place], they can do private pay oxygen for him since we do not have documentation of low oxygen that I can see, and he does not have medicare part B Vitals documented on ER flow show 100% but pt states he was on oxygen at this time and that his sats were 84% when EMS came to home.    He does not smoke- no smokers in the home I advised patient he may need to return to the ER.  He continues to state a severe fear of needles which prevents him from getting medical care.  I advised him likely he could be given an oral medication by ER staff to help with anxiety, and that refusal to have any blood work may lead to lack of necessary medical intervention and death.  He states understanding   I wrote an rx for oxygen and faxed- advised pt's wife that he will likely have to pay cash if he wants this asap, they quoted me about 250/month Fax [phone]

## 2022-02-16 ENCOUNTER — Emergency Department (HOSPITAL_COMMUNITY): Payer: Commercial Managed Care - HMO

## 2022-02-16 ENCOUNTER — Other Ambulatory Visit: Payer: Self-pay

## 2022-02-16 ENCOUNTER — Encounter (HOSPITAL_COMMUNITY): Payer: Self-pay | Admitting: Emergency Medicine

## 2022-02-16 ENCOUNTER — Emergency Department (HOSPITAL_COMMUNITY)
Admission: EM | Admit: 2022-02-16 | Discharge: 2022-02-16 | Disposition: A | Discharge status: Home / Self Care | Payer: Commercial Managed Care - HMO | Attending: Emergency Medicine | Admitting: Emergency Medicine

## 2022-02-16 DIAGNOSIS — F17210 Nicotine dependence, cigarettes, uncomplicated: Secondary | ICD-10-CM | POA: Diagnosis not present

## 2022-02-16 DIAGNOSIS — R0602 Shortness of breath: Secondary | ICD-10-CM | POA: Diagnosis present

## 2022-02-16 DIAGNOSIS — J441 Chronic obstructive pulmonary disease with (acute) exacerbation: Secondary | ICD-10-CM | POA: Diagnosis not present

## 2022-02-16 MED ORDER — IPRATROPIUM-ALBUTEROL 0.5-2.5 (3) MG/3ML IN SOLN
3.0000 mL | Freq: Once | RESPIRATORY_TRACT | Status: AC
  Administered 2022-02-16: 3 mL via RESPIRATORY_TRACT
  Filled 2022-02-16: qty 3

## 2022-02-16 MED ORDER — ALBUTEROL SULFATE HFA 108 (90 BASE) MCG/ACT IN AERS: 2.0000 | INHALATION_SPRAY | RESPIRATORY_TRACT | Status: DC | PRN

## 2022-02-16 MED ORDER — OXYCODONE HCL 5 MG PO TABS
5.0000 mg | ORAL_TABLET | Freq: Once | ORAL | Status: AC
  Administered 2022-02-16: 5 mg via ORAL
  Filled 2022-02-16: qty 1

## 2022-02-16 NOTE — ED Provider Notes (Signed)
[facility]-EMERGENCY DEPT [facility] Emergency Department Provider Note MRN:  [id number]  Arrival date & time: 02/16/22     Chief Complaint   Shortness of Breath   History of Present Illness   Grant Galvan is a 61 y.o. year-old male with a history of COPD presenting to the ED with chief complaint of shortness of breath.  Worsening shortness of breath over the past few days.  Oxygen was only 88% at home.  Patient has a severe fear of needles.  Denies chest pain, some increased cough recently, currently taking prednisone and antibiotics.  Review of Systems  A thorough review of systems was obtained and all systems are negative except as noted in the HPI and PMH.   Patient's Health History    Past Medical History:  Diagnosis Date   COPD (chronic obstructive pulmonary disease) (HCC)    Degenerative disc disease    reports back fx x 3.    History reviewed. No pertinent surgical history.  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  hx heavy   Drug use: Not Currently   Sexual activity: Yes    Partners: Female  Other Topics Concern   Not on file  Social History Narrative   Lives with his wife, and they care for his father.  Sold their house at [facility] to move home to take care of his mother.  Was planning to move to his house  in [place] after she died, but his father asked him to stay to care for him.   Social Determinants of Health   Financial Resource Strain: Not on file  Food Insecurity: Not on file  Transportation Needs: Not on file  Physical Activity: Not on file  Stress: Not on file  Social Connections: Not on file  Intimate Partner Violence: Not on file     Physical Exam   Vitals:   02/16/22 0404 02/16/22 0530  BP:  124/86  Pulse:  90  Resp:  20  Temp:    SpO2: 98% 96%    CONSTITUTIONAL: Well-appearing, NAD NEURO/PSYCH:  Alert and oriented x 3, no focal deficits EYES:  eyes equal and reactive ENT/NECK:  no LAD, no JVD CARDIO: Regular rate, well-perfused, normal S1 and S2 PULM: Poor air movement, tachypnea with accessory muscle use GI/GU:  non-distended, non-tender MSK/SPINE:  No gross deformities, no edema SKIN:  no rash, atraumatic   *Additional and/or pertinent findings included in MDM below  Diagnostic and Interventional Summary    EKG Interpretation  Date/Time:  Saturday February 16 2022 05:23:29 EST Ventricular Rate:  93 PR Interval:  127 QRS Duration:  103 QT Interval:  365 QTC Calculation: 454 R Axis:   62 Text Interpretation: Sinus rhythm RAE, consider biatrial enlargement Baseline wander in lead(s) V4 Confirmed by Kennis Carina [phone]) on 02/16/2022 7:05:29 AM       Labs Reviewed - No data to display  DG Chest Portable 1 View  Final Result      Medications  ipratropium-albuterol (DUONEB) 0.5-2.5 (3) MG/3ML nebulizer solution 3 mL (3 mLs Nebulization Given 02/16/22 0404)     Procedures  /  Critical Care Procedures  ED Course and Medical Decision Making  Initial Impression and Ddx Favoring COPD exacerbation, also considering pneumonia, pneumothorax.  Doubt PE.  Exam very consistent with COPD exacerbation.  Providing DuoNeb, x-ray, unfortunately patient refusing any needles of any kind due to his severe phobia.  Past medical/surgical history that  increases complexity of ED encounter: COPD  Interpretation of Diagnostics I personally reviewed the Chest Xray and my interpretation is as follows: No pneumothorax or obvious pneumonia  Labs refused by patient, intramuscular injection refused but patient.  Patient Reassessment and Ultimate Disposition/Management     Patient feels a lot better after being put on supplemental oxygen.  Received a breathing treatment.  I pause the oxygen and shortly after patient became more symptomatic, oxygen saturation slowly downtrending to 94%.  Difficult situation given his severe fear of needles and inability to get basic lab testing that usually is required for admission.  Patient mostly just wants to go home and maybe get a small oxygen tank to last until Monday, when his oxygen will be delivered to his house.  Will consult TOC to see if this is possible.  Signed out to default provider.  Patient management required discussion with the following services or consulting groups:  Case Management/Social Work  Complexity of Problems Addressed Acute illness or injury that poses threat of life of bodily function  Additional Data Reviewed and Analyzed Further history obtained from: Prior ED visit notes  Additional Factors Impacting ED Encounter Risk None  Elmer Sow. Pilar Plate, MD [facility] Emergency Medicine [facility] [email or web address]  Final Clinical Impressions(s) / ED Diagnoses     ICD-10-CM   1. COPD exacerbation (HCC)  J44.1       ED Discharge Orders     None        Discharge Instructions Discussed with and Provided to Patient:   Discharge Instructions   None      Sabas Sous, MD 02/16/22 [phone]

## 2022-02-16 NOTE — ED Triage Notes (Signed)
Pt called EMS for c/o SOB. States his O2 @ home was 88% on room air. Pt took 1 nebulizer treatment prior to EMS arrival and then was given another albuterol nebulizer treatment en route by EMS. EMS states they were going to give pt Solumedrol via IV but pt refused to allow them to start an IV. Pt also refuses IV while here.

## 2022-02-16 NOTE — ED Notes (Addendum)
Ambulatory Pulse Oximetry:  Resting HR 94   O2 Sat 97% on 3L via nasal cannula Walk HR 103   O2 Sat 98% on 3L nasal cannula  Resting HR 104   O2 Sat 93% on room air Walk HR 116   O2 Sat 88% on room air   _x_Test stopped due to: Pt dyspnea/SOB while ambulating limited duration of trial

## 2022-02-16 NOTE — ED Notes (Signed)
Pt noted to be significantly more short of breath while ambulation/room air trial underway; see previous note. Returned to baseline vitals once supplemental oxygen was applied at 3L via nasal cannula.

## 2022-02-16 NOTE — ED Notes (Signed)
ED Provider at bedside. 

## 2022-02-16 NOTE — ED Provider Notes (Signed)
Patient Breath sounds decreased with no wheezes. Has very high anxiety , not sure where this is coming from. Had to stop neb as he was getting Jittery his request. X-ray lungs are clear. No labs due to no needle sticks has needle phobia. Suspect possible drug interaction or drug withdrawal ??

## 2022-02-16 NOTE — Discharge Instructions (Signed)
Follow up with your md Monday as planned

## 2022-02-16 NOTE — Progress Notes (Addendum)
CSW contacted about need for home O2.  Pt reportedly has home O2 that he is supposed to pick up from [place] on Monday.  CSW spoke with Tammy at [facility].  She does not have any home O2 orders for this pt in the system, is not aware of anything pending for Monday.  Insurance Berkley Harvey could not be obtained over the [employer] note pt could potentially pick up O2 today but would have to private pay initially for it.  Daleen Squibb, MSW, LCSW 11/18/20239:14 AM   CSW spoke with [employer].  They could deliver new home O2 DME to the ED today, could bill pt insurance.  Need walking O2 saturation note and home O2 orders.  RN informed. Daleen Squibb, MSW, LCSW 11/18/20239:26 AM   1045: TC [facility].  She reviewed saturation note, home O2 order from [facility], said this is all she needs.  They will deliver to [facility], best guess would be 2 hours for delivery time.  Team informed. Daleen Squibb, MSW, LCSW 11/18/202310:51 AM

## 2022-02-22 ENCOUNTER — Encounter: Payer: Self-pay | Admitting: Family Medicine

## 2022-02-22 DIAGNOSIS — M25559 Pain in unspecified hip: Secondary | ICD-10-CM

## 2022-02-24 MED ORDER — OXYCODONE HCL 5 MG PO TABS: 2.5000 mg | ORAL_TABLET | Freq: Three times a day (TID) | ORAL | 0 refills | Status: DC | PRN

## 2022-02-27 ENCOUNTER — Encounter: Payer: Self-pay | Admitting: Family Medicine

## 2022-02-27 DIAGNOSIS — J449 Chronic obstructive pulmonary disease, unspecified: Secondary | ICD-10-CM

## 2022-02-28 ENCOUNTER — Ambulatory Visit: Payer: Self-pay | Admitting: Physician Assistant

## 2022-02-28 MED ORDER — IPRATROPIUM-ALBUTEROL 0.5-2.5 (3) MG/3ML IN SOLN: 3.0000 mL | Freq: Four times a day (QID) | RESPIRATORY_TRACT | 3 refills | Status: DC | PRN

## 2022-02-28 NOTE — Addendum Note (Signed)
Addended by: Abbe Amsterdam C on: 02/28/2022 12:40 PM   Modules accepted: Orders

## 2022-03-04 ENCOUNTER — Encounter: Payer: Self-pay | Admitting: Family Medicine

## 2022-03-04 NOTE — Telephone Encounter (Signed)
Please see below.

## 2022-03-19 ENCOUNTER — Encounter: Payer: Self-pay | Admitting: Family Medicine

## 2022-03-19 DIAGNOSIS — M25559 Pain in unspecified hip: Secondary | ICD-10-CM

## 2022-03-19 MED ORDER — OXYCODONE HCL 5 MG PO TABS: 2.5000 mg | ORAL_TABLET | Freq: Three times a day (TID) | ORAL | 0 refills | Status: DC | PRN

## 2022-03-20 ENCOUNTER — Telehealth: Payer: Self-pay | Admitting: Family Medicine

## 2022-03-20 NOTE — Telephone Encounter (Signed)
Patient called to advise that Dr. Patsy Lager needs to call [facility] to get them to fill pain meds early since he is leaving at 4am in the morning for [place] until 04/10/22.

## 2022-03-20 NOTE — Telephone Encounter (Signed)
See below

## 2022-03-22 ENCOUNTER — Encounter: Payer: Self-pay | Admitting: Family Medicine

## 2022-03-22 ENCOUNTER — Other Ambulatory Visit: Payer: Self-pay | Admitting: Family Medicine

## 2022-03-22 ENCOUNTER — Ambulatory Visit
Admission: RE | Admit: 2022-03-22 | Discharge: 2022-03-22 | Disposition: A | Discharge status: Home / Self Care | Payer: Commercial Managed Care - HMO | Source: Ambulatory Visit | Attending: Family Medicine | Admitting: Family Medicine

## 2022-03-22 DIAGNOSIS — G8929 Other chronic pain: Secondary | ICD-10-CM

## 2022-03-22 DIAGNOSIS — J449 Chronic obstructive pulmonary disease, unspecified: Secondary | ICD-10-CM

## 2022-03-22 DIAGNOSIS — I10 Essential (primary) hypertension: Secondary | ICD-10-CM

## 2022-03-22 DIAGNOSIS — R911 Solitary pulmonary nodule: Secondary | ICD-10-CM

## 2022-03-22 DIAGNOSIS — Z125 Encounter for screening for malignant neoplasm of prostate: Secondary | ICD-10-CM

## 2022-03-22 DIAGNOSIS — Z131 Encounter for screening for diabetes mellitus: Secondary | ICD-10-CM

## 2022-03-22 DIAGNOSIS — F40298 Other specified phobia: Secondary | ICD-10-CM

## 2022-03-22 DIAGNOSIS — Z13220 Encounter for screening for lipoid disorders: Secondary | ICD-10-CM

## 2022-03-22 DIAGNOSIS — Z122 Encounter for screening for malignant neoplasm of respiratory organs: Secondary | ICD-10-CM

## 2022-03-26 ENCOUNTER — Telehealth (HOSPITAL_BASED_OUTPATIENT_CLINIC_OR_DEPARTMENT_OTHER): Payer: Self-pay

## 2022-03-26 MED ORDER — DOXYCYCLINE HYCLATE 100 MG PO CAPS: 100.0000 mg | ORAL_CAPSULE | Freq: Two times a day (BID) | ORAL | 0 refills | Status: DC

## 2022-03-26 NOTE — Addendum Note (Signed)
Addended by: Abbe Amsterdam C on: 03/26/2022 05:07 PM   Modules accepted: Orders

## 2022-03-26 NOTE — Addendum Note (Signed)
Addended by: Abbe Amsterdam C on: 03/26/2022 01:46 PM   Modules accepted: Orders

## 2022-03-27 NOTE — Telephone Encounter (Signed)
Patient wife calling stating that they do have appt scheduled BUT however imaging is stating they are needing an authorization from [facility] to do the scan or they will cancel and they cannot do the appt  Please advise

## 2022-03-28 ENCOUNTER — Emergency Department (HOSPITAL_COMMUNITY)
Admission: EM | Admit: 2022-03-28 | Discharge: 2022-03-28 | Disposition: A | Discharge status: Home / Self Care | Payer: Commercial Managed Care - HMO | Attending: Emergency Medicine | Admitting: Emergency Medicine

## 2022-03-28 ENCOUNTER — Emergency Department (HOSPITAL_COMMUNITY): Payer: Commercial Managed Care - HMO

## 2022-03-28 ENCOUNTER — Other Ambulatory Visit: Payer: Self-pay

## 2022-03-28 DIAGNOSIS — R0789 Other chest pain: Secondary | ICD-10-CM | POA: Diagnosis present

## 2022-03-28 DIAGNOSIS — Z79899 Other long term (current) drug therapy: Secondary | ICD-10-CM | POA: Insufficient documentation

## 2022-03-28 MED ORDER — OXYCODONE-ACETAMINOPHEN 5-325 MG PO TABS: 1.0000 | ORAL_TABLET | Freq: Four times a day (QID) | ORAL | 0 refills | Status: DC | PRN

## 2022-03-28 MED ORDER — METHOCARBAMOL 500 MG PO TABS
1000.0000 mg | ORAL_TABLET | Freq: Once | ORAL | Status: AC
  Administered 2022-03-28: 1000 mg via ORAL
  Filled 2022-03-28: qty 2

## 2022-03-28 MED ORDER — METHOCARBAMOL 1000 MG PO TABS: 1000.0000 mg | ORAL_TABLET | Freq: Three times a day (TID) | ORAL | 0 refills | Status: DC | PRN

## 2022-03-28 MED ORDER — IBUPROFEN 800 MG PO TABS: 800.0000 mg | ORAL_TABLET | Freq: Three times a day (TID) | ORAL | 0 refills | Status: DC

## 2022-03-28 MED ORDER — OXYCODONE-ACETAMINOPHEN 5-325 MG PO TABS
2.0000 | ORAL_TABLET | Freq: Once | ORAL | Status: AC
  Administered 2022-03-28: 2 via ORAL
  Filled 2022-03-28: qty 2

## 2022-03-28 MED ORDER — BENZONATATE 100 MG PO CAPS: 100.0000 mg | ORAL_CAPSULE | Freq: Three times a day (TID) | ORAL | 0 refills | Status: DC | PRN

## 2022-03-28 MED ORDER — IBUPROFEN 800 MG PO TABS
800.0000 mg | ORAL_TABLET | Freq: Once | ORAL | Status: AC
  Administered 2022-03-28: 800 mg via ORAL
  Filled 2022-03-28: qty 1

## 2022-03-28 NOTE — ED Provider Notes (Signed)
[facility] EMERGENCY DEPARTMENT Provider Note   CSN: [id number] Arrival date & time: 03/28/22  2440     History  Chief Complaint  Patient presents with   Chest Pain    Grant Galvan is a 61 y.o. male.  HPI 61 year old male presents with right-sided chest pain.  Chest pain started yesterday and he believes it is from coughing.  He had a chest x-ray last week and was told he had pneumonia and started on doxycycline.  He started this 2 days ago.  Since starting the doxycycline he is now developed a new cough with green/yellow sputum.  However yesterday he developed right-sided chest pain.  It hurts with movement or coughing.  No abdominal pain, vomiting, leg swelling.  He is taking 5 mg oxycodone but is not helping.  He is asking for something stronger for pain currently though also is adamant about no IV or needles.  Home Medications Prior to Admission medications   Medication Sig Start Date End Date Taking? Authorizing Provider  benzonatate (TESSALON) 100 MG capsule Take 1 capsule (100 mg total) by mouth 3 (three) times daily as needed for cough. 03/28/22  Yes Pricilla Loveless, MD  ibuprofen (ADVIL) 800 MG tablet Take 1 tablet (800 mg total) by mouth 3 (three) times daily. 03/28/22  Yes Pricilla Loveless, MD  methocarbamol 1000 MG TABS Take 1,000 mg by mouth every 8 (eight) hours as needed for muscle spasms. 03/28/22  Yes Pricilla Loveless, MD  oxyCODONE-acetaminophen (PERCOCET/ROXICET) 5-325 MG tablet Take 1 tablet by mouth every 6 (six) hours as needed for severe pain. 03/28/22  Yes Pricilla Loveless, MD  albuterol (VENTOLIN HFA) 108 (90 Base) MCG/ACT inhaler Inhale 2 puffs into the lungs every 6 (six) hours as needed for wheezing or shortness of breath. 02/14/22   Shon Hale, MD  ALPRAZolam Prudy Feeler) 1 MG tablet Take 1/2 or 1 by mouth for anxiety 30 minutes prior to blood draw 01/16/22   Copland, Gwenlyn Found, MD  amLODipine (NORVASC) 5 MG tablet Take 1 tablet (5 mg total) by mouth  daily. 02/14/22   Shon Hale, MD  clotrimazole-betamethasone (LOTRISONE) cream Apply 1 Application topically 2 (two) times daily. 11/29/21   Jacquelin Hawking, PA-C  doxycycline (VIBRAMYCIN) 100 MG capsule Take 1 capsule (100 mg total) by mouth 2 (two) times daily. 03/26/22   Copland, Gwenlyn Found, MD  fluticasone-salmeterol (ADVAIR) 100-50 MCG/ACT AEPB Inhale 1 puff into the lungs 2 (two) times daily. 02/14/22   Shon Hale, MD  gabapentin (NEURONTIN) 300 MG capsule Take 1 capsule (300 mg total) by mouth 3 (three) times daily. 01/22/22   Copland, Gwenlyn Found, MD  guaiFENesin (MUCINEX) 600 MG 12 hr tablet Take 1 tablet (600 mg total) by mouth 2 (two) times daily. 02/14/22 02/14/23  Shon Hale, MD  ipratropium-albuterol (DUONEB) 0.5-2.5 (3) MG/3ML SOLN Take 3 mLs by nebulization every 6 (six) hours as needed. 02/28/22   Copland, Gwenlyn Found, MD  oxyCODONE (ROXICODONE) 5 MG immediate release tablet Take 0.5-1 tablets (2.5-5 mg total) by mouth every 8 (eight) hours as needed for severe pain. 03/19/22   Copland, Gwenlyn Found, MD  triamcinolone cream (KENALOG) 0.5 % Apply topically.    [provider]  umeclidinium bromide (INCRUSE ELLIPTA) 62.5 MCG/ACT AEPB Inhale 1 puff into the lungs daily. 02/14/22   Shon Hale, MD      Allergies    Celecoxib, Cyclobenzaprine hcl, and Tramadol    Review of Systems   Review of Systems  Constitutional:  Negative for fever.  Respiratory:  Positive for cough.   Cardiovascular:  Positive for chest pain. Negative for leg swelling.    Physical Exam Updated Vital Signs BP 113/77   Pulse 87   Temp 98 F (36.7 C) (Oral)   Resp (!) 24   Wt 73 kg   SpO2 97%   BMI 20.12 kg/m  Physical Exam Vitals and nursing note reviewed.  Constitutional:      Appearance: He is well-developed.  HENT:     Head: Normocephalic and atraumatic.  Cardiovascular:     Rate and Rhythm: Normal rate and regular rhythm.     Heart sounds: Normal heart sounds.   Pulmonary:     Effort: Pulmonary effort is normal.     Breath sounds: Normal breath sounds.  Chest:     Chest wall: Tenderness (right anterior/lateral chest. no crepitus) present.     Comments: Pain is worsened with deep breaths or sitting up/moving. Abdominal:     Palpations: Abdomen is soft.     Tenderness: There is no abdominal tenderness.  Skin:    General: Skin is warm and dry.  Neurological:     Mental Status: He is alert.     ED Results / Procedures / Treatments   Labs (all labs ordered are listed, but only abnormal results are displayed) Labs Reviewed - No data to display  EKG EKG Interpretation  Date/Time:  Thursday March 28 2022 08:22:33 EST Ventricular Rate:  92 PR Interval:  123 QRS Duration: 102 QT Interval:  364 QTC Calculation: 451 R Axis:   86 Text Interpretation: Sinus rhythm Right atrial enlargement no significant change since Nov 2023 Confirmed by Pricilla Loveless [phone]) on 03/28/2022 8:27:10 AM  Radiology DG Ribs Unilateral W/Chest Right  Result Date: 03/28/2022 CLINICAL DATA:  61 year old male with coughing.  Right rib pain. EXAM: RIGHT RIBS AND CHEST - 3+ VIEW COMPARISON:  Chest radiographs 03/22/2022 and earlier. FINDINGS: PA view of the chest at 0903 hours. Severe chronic lung disease with hyperinflation, extensive upper lobe architectural distortion greater on the left - but this apical opacity is new compared to November radiographs. No pneumothorax or new pulmonary opacity identified. Stable cardiac size and mediastinal contours. Visualized tracheal air column is within normal limits. Two oblique views of the right ribs. No displaced rib fracture identified. Stable visualized osseous structures. Paucity of bowel gas in the visible abdomen. IMPRESSION: 1. Unresolved confluent abnormal upper lobe lung opacity since December 22nd - new from last month although superimposed on chronic pulmonary hyperinflation. Favor ongoing bilateral Pneumonia. No  pleural effusion. 2. No right rib fracture identified. Electronically Signed   By: Odessa Fleming M.D.   On: 03/28/2022 09:25    Procedures Procedures    Medications Ordered in ED Medications  oxyCODONE-acetaminophen (PERCOCET/ROXICET) 5-325 MG per tablet 2 tablet (2 tablets Oral Given 03/28/22 0900)  ibuprofen (ADVIL) tablet 800 mg (800 mg Oral Given 03/28/22 0900)  methocarbamol (ROBAXIN) tablet 1,000 mg (1,000 mg Oral Given 03/28/22 0900)    ED Course/ Medical Decision Making/ A&P                           Medical Decision Making Amount and/or Complexity of Data Reviewed Radiology: ordered and independent interpretation performed.    Details: No pneumothorax. ECG/medicine tests: ordered and independent interpretation performed.    Details: No change from baseline  Risk Prescription drug management.   Patient appears to have chest wall pain from coughing.  He was  given Percocet, ibuprofen, Robaxin here.  He reports a little bit of relief.  No evidence of pneumothorax or other emergent condition.  He is adamant about no IV or labs but at this point my suspicion for ACS, PE, dissection, etc. is all quite low.  I do not think they will be necessary.  He is asking for some breakthrough pain medicine.  He is currently receiving oxycodone every 8 from his PCP.  Will give a very short course of breakthrough Percocet but otherwise he will need NSAIDs, muscle relaxer, and cough suppressant.  Will give Tessalon but also discussed getting over-the-counter medicine.  He appears stable for discharge to follow-up with PCP.  From a pneumonia perspective, while there is pneumonia still on the x-ray, he is only had a couple days of treatment and feels like he is improving and so I do not think a change in medications is necessary.        Final Clinical Impression(s) / ED Diagnoses Final diagnoses:  Right-sided chest wall pain    Rx / DC Orders ED Discharge Orders          Ordered     oxyCODONE-acetaminophen (PERCOCET/ROXICET) 5-325 MG tablet  Every 6 hours PRN        03/28/22 0948    ibuprofen (ADVIL) 800 MG tablet  3 times daily        03/28/22 0948    methocarbamol 1000 MG TABS  Every 8 hours PRN        03/28/22 0948    benzonatate (TESSALON) 100 MG capsule  3 times daily PRN        03/28/22 2440              Pricilla Loveless, MD 03/28/22 1009

## 2022-03-28 NOTE — Telephone Encounter (Signed)
Pharmist: Betsy Pries Call back: [phone]   Called stating pt was seen in ED and was giving pain meds, same meds perc but with the tylenol.  So technically he has gotten them earlier. She just wanted to let Dr.Copland know!

## 2022-03-28 NOTE — ED Triage Notes (Addendum)
[facility] for R upper CP, cough, and sob. H/o HTN and COPD. No longer smoking. Seen by PCP recently. Started on doxycycline. Takes albuterol and other maintenance inhalers. Last inhaler use this am at 0500. New pain onset/ increase yesterday. Baseline of 0.5LPM O2 [place] at home. Adamantly against and refusing any needlestick, lab draw or IV for EMS or ED. States, "no needles sticks, even if I were unconscious". States, "my oxycodone is not helping pain". Pinpoints pain to R upper chest, worse with cough. Rates 10/10.

## 2022-03-28 NOTE — Discharge Instructions (Addendum)
If you develop recurrent, continued, or worsening chest pain, shortness of breath, fever, vomiting, abdominal or back pain, or any other new/concerning symptoms then return to the ER for evaluation.  

## 2022-03-28 NOTE — Telephone Encounter (Signed)
FYI

## 2022-03-28 NOTE — ED Notes (Signed)
EDP in to see, at BS.  

## 2022-03-29 ENCOUNTER — Ambulatory Visit
Admission: RE | Admit: 2022-03-29 | Discharge: 2022-03-29 | Disposition: A | Discharge status: Home / Self Care | Payer: Commercial Managed Care - HMO | Source: Ambulatory Visit | Attending: Family Medicine | Admitting: Family Medicine

## 2022-03-29 DIAGNOSIS — R911 Solitary pulmonary nodule: Secondary | ICD-10-CM

## 2022-04-02 ENCOUNTER — Encounter: Payer: Self-pay | Admitting: Family Medicine

## 2022-04-02 ENCOUNTER — Other Ambulatory Visit: Payer: Self-pay | Admitting: Family Medicine

## 2022-04-02 DIAGNOSIS — R911 Solitary pulmonary nodule: Secondary | ICD-10-CM

## 2022-04-02 DIAGNOSIS — I7121 Aneurysm of the ascending aorta, without rupture: Secondary | ICD-10-CM

## 2022-04-03 DIAGNOSIS — I7121 Aneurysm of the ascending aorta, without rupture: Secondary | ICD-10-CM | POA: Insufficient documentation

## 2022-04-08 ENCOUNTER — Encounter: Payer: Self-pay | Admitting: Family Medicine

## 2022-04-08 DIAGNOSIS — J189 Pneumonia, unspecified organism: Secondary | ICD-10-CM

## 2022-04-09 MED ORDER — AMOXICILLIN-POT CLAVULANATE 875-125 MG PO TABS: 1.0000 | ORAL_TABLET | Freq: Two times a day (BID) | ORAL | 0 refills | Status: DC

## 2022-04-09 NOTE — Addendum Note (Signed)
Addended by: Lamar Blinks C on: 04/09/2022 01:14 PM   Modules accepted: Orders

## 2022-04-18 ENCOUNTER — Telehealth: Payer: Self-pay

## 2022-04-18 ENCOUNTER — Encounter: Payer: Self-pay | Admitting: Family Medicine

## 2022-04-18 DIAGNOSIS — J449 Chronic obstructive pulmonary disease, unspecified: Secondary | ICD-10-CM

## 2022-04-18 MED ORDER — INCRUSE ELLIPTA 62.5 MCG/ACT IN AEPB: 1.0000 | INHALATION_SPRAY | Freq: Every day | RESPIRATORY_TRACT | 6 refills | Status: DC

## 2022-04-18 NOTE — Telephone Encounter (Signed)
PA denied.     This drug is not covered on the formulary. We are denying your request because we do not show that you have tried at least 2 covered drugs that can treat your condition. You have already tried [employer]. Other covered drug(s) is/are: Advair HFA Inhalation Aerosol (45-21, 115-21, And 230-21 mcg/Act), Combivent Respimat Inhalation Aerosol Solution 20-100 Mcg/Act, Trelegy Ellipta Inhalation Aerosol Powder Breath Activated (100-62.5-25mcg/Inh, 200-62.5-25 mcg/inh), Charolotte Eke Aerosphere Inhalation Aerosol 9-4.96mcg/Act, Anoro Ellipta Inhalation Aerosol Powder Breath Activated 62.5-25mcg/Inh. We may be able to make an exception to cover this drug. Your doctor will need to send [place] medical records showing that you tried this drug. If you cannot take the covered drug, your doctor will need to tell us why. Note: Some covered drug(s) may have quantity limits. Please refer to the formulary for details.

## 2022-04-18 NOTE — Telephone Encounter (Signed)
PA initiated via Covermymeds; KEY: [id number]. Awaiting determination.

## 2022-04-19 ENCOUNTER — Telehealth: Payer: Self-pay | Admitting: Family Medicine

## 2022-04-19 ENCOUNTER — Encounter: Payer: Self-pay | Admitting: Family Medicine

## 2022-04-19 DIAGNOSIS — M25559 Pain in unspecified hip: Secondary | ICD-10-CM

## 2022-04-19 NOTE — Telephone Encounter (Signed)
Pt's wife wanted to get clarification on some medication. She wanted to know if pcp would still like to keep refilling his nebulizer to use as needed along with the inhaler. She staed this was helping him a lot. If so, they are needing a new PA done for the inhaler as he no longer uses [place]. Please advise.   [place], [place], [place] [address] Phone: [phone]  Fax: [phone]

## 2022-04-19 NOTE — Telephone Encounter (Signed)
Sent a MyChart message, it sounds like they would rather just use DuoNeb instead of trying to get a combined inhaler.  This is certainly fine.  I asked him to clarify this is what they would like

## 2022-04-22 ENCOUNTER — Telehealth: Payer: Self-pay | Admitting: Family Medicine

## 2022-04-22 MED ORDER — HYDROCHLOROTHIAZIDE 25 MG PO TABS: 25.0000 mg | ORAL_TABLET | Freq: Every day | ORAL | 3 refills | Status: DC

## 2022-04-22 MED ORDER — OXYCODONE HCL 5 MG PO TABS: 2.5000 mg | ORAL_TABLET | Freq: Three times a day (TID) | ORAL | 0 refills | Status: DC | PRN

## 2022-04-22 NOTE — Telephone Encounter (Signed)
Pt called stating that the BP meds are not able to keep his BP down. His last reading was 126/103 and he was wondering if something else could be called in.

## 2022-04-22 NOTE — Telephone Encounter (Signed)
Called pt back regarding his BP- running 156/126 when he checked it 2 days ago  BP 171/137 right now- he just happened to check it and notice it was high  He is otherwise feeling just fine- no CP, no SOB, no neuro sx He has tried to reduce salt  He is taking his amlodipine 5mg  still He has used hydrochlorothiazide in the past on as-needed basis and did well.  Noted warning from epic about Celebrex allergy, patient states that occurred many years ago he has used hydrochlorothiazide in the interim with no problem.  I advised patient we can try adding HCTZ since has been successful previously, but this is complicated by his in ability to let me get any labs  Rx for hctz 25 mg which he can add daily as needed.  Offered to see pt in the office but he does not have transportation and lives 45 minutes away He does agree to seek care closer to home- [facility], urgent care- if BP not coming down or if any other symptoms

## 2022-04-22 NOTE — Telephone Encounter (Signed)
PA denied for Duoneb under Medicare Part D. Pharmacy must use Part A or Part B.

## 2022-04-23 NOTE — Telephone Encounter (Signed)
See mychart message from 04/19/22

## 2022-04-23 NOTE — Telephone Encounter (Signed)
Spoke with pharmacy ([facility]) and ask them to run claim thru part B and they stated that pt did not have part B.  We have two cards in our system and I the pharmacy what they were running through and I gave second card and they stated that its probably the same thing.  Which the does look like part D, so it may cover either.  Spoke with patient today about his DuoNeb.  He stated that he disenrolled from medicare.  At the end of the month he should have medicaid direct.  I asked did he have a card or any card info for rxs.  He gave me the second card number that we have on his chart.  I advised that this looks like a part d card too and it may not cover it either.  I gave a couple of options he can give them that info at the pharmacy and he call the medicaid he spoke with yesterday and have them speak with pharmacy.  We are unable to help with this.  When I advised that he can buy a box and he stated that it will not last that long.  He states that he takes the Duoneb every 6 hours and not as needed.  He also would like for you to see if you can ok his pain meds a couple of days early just in case if he has any issues with ins that he can go ahead and call ins if needed.

## 2022-04-23 NOTE — Telephone Encounter (Signed)
Patient called to follow up on nebulizer. Advised that someone would be in touch as soon as we have an update. Please call patient to advise

## 2022-04-24 ENCOUNTER — Other Ambulatory Visit: Payer: Self-pay | Admitting: Family Medicine

## 2022-04-25 ENCOUNTER — Telehealth: Payer: Self-pay | Admitting: *Deleted

## 2022-04-25 NOTE — Telephone Encounter (Signed)
Pt called to ask about form.  Form has been faxed back to pharmacy and to [facility].

## 2022-04-25 NOTE — Telephone Encounter (Signed)
Prior auth started via cover my meds.  Awaiting determination.  Key: Grant Galvan

## 2022-04-25 NOTE — Telephone Encounter (Signed)
Denied today Denied. IPRATROPIUM-ALBUTEROL Solution is used in a nebulizer. A nebulizer is a piece of durable medical equipment (DME). Drugs used with DME in the home are covered under Medicare Part B. Our records show that you do not live in a long term care (LTC) facility. We cannot pay for drugs under Medicare Part D if they are covered under Medicare Part A or B. We did not decide whether IPRATROPIUM-ALBUTEROL Solution is medically necessary. We made our decision only on the fact that we cannot pay for the drug under Medicare Part D. For more information, talk to your prescriber or call 1- 800-MEDICARE.

## 2022-04-25 NOTE — Telephone Encounter (Signed)
Pt notified about this again.

## 2022-04-26 ENCOUNTER — Telehealth: Payer: Self-pay | Admitting: *Deleted

## 2022-04-26 ENCOUNTER — Other Ambulatory Visit: Payer: Self-pay | Admitting: *Deleted

## 2022-04-26 DIAGNOSIS — J449 Chronic obstructive pulmonary disease, unspecified: Secondary | ICD-10-CM

## 2022-04-26 MED ORDER — IPRATROPIUM-ALBUTEROL 0.5-2.5 (3) MG/3ML IN SOLN: 3.0000 mL | Freq: Four times a day (QID) | RESPIRATORY_TRACT | 3 refills | Status: DC | PRN

## 2022-04-26 NOTE — Telephone Encounter (Signed)
Prior auth started via cover my meds.  Awaiting determination.  Key: [id number]

## 2022-04-26 NOTE — Telephone Encounter (Signed)
Approved today Approved. This drug has been approved under the Member's Medicare Part D benefit. Approved quantity: 90 units per 30 day(s). You may fill up to a 90 day supply except for those on Specialty Tier 5, which can be filled up to a 30 day supply. Please call the pharmacy to process the prescription claim. Authorization Expiration Date: 03/31/2098

## 2022-04-26 NOTE — Telephone Encounter (Signed)
Patient notified of approval.

## 2022-05-02 ENCOUNTER — Telehealth: Payer: Self-pay | Admitting: *Deleted

## 2022-05-02 MED ORDER — AMLODIPINE BESYLATE 10 MG PO TABS: 10.0000 mg | ORAL_TABLET | Freq: Every day | ORAL | 3 refills | Status: DC

## 2022-05-02 NOTE — Addendum Note (Signed)
Addended by: Kem Boroughs D on: 05/02/2022 02:31 PM   Modules accepted: Orders

## 2022-05-02 NOTE — Addendum Note (Signed)
Addended by: Kem Boroughs D on: 05/02/2022 02:20 PM   Modules accepted: Orders

## 2022-05-02 NOTE — Telephone Encounter (Signed)
Pt called and needed new rx for amlodipine.  Dr. Lorelei Pont increased it to 2 tabs of the 5mg  tabs.  Rx sent in for 10mg .

## 2022-05-03 ENCOUNTER — Telehealth: Payer: Self-pay | Admitting: Family Medicine

## 2022-05-03 MED ORDER — AMLODIPINE BESYLATE 10 MG PO TABS: 10.0000 mg | ORAL_TABLET | Freq: Every day | ORAL | 3 refills | Status: DC

## 2022-05-03 NOTE — Telephone Encounter (Signed)
Patient would like a call back from [place] to discuss a problem he is having with his medication. Please advise.

## 2022-05-03 NOTE — Telephone Encounter (Signed)
Left message on machine to call back  

## 2022-05-03 NOTE — Addendum Note (Signed)
Addended by: Kem Boroughs D on: 05/03/2022 07:56 AM   Modules accepted: Orders

## 2022-05-06 NOTE — Telephone Encounter (Signed)
Spoke with patient and he did pick up bp meds but still unable to get nebulizer meds taken care of.

## 2022-05-08 ENCOUNTER — Encounter: Payer: Self-pay | Admitting: Family Medicine

## 2022-05-13 ENCOUNTER — Encounter (INDEPENDENT_AMBULATORY_CARE_PROVIDER_SITE_OTHER): Payer: Medicaid Other | Admitting: Family Medicine

## 2022-05-13 ENCOUNTER — Ambulatory Visit (HOSPITAL_COMMUNITY)
Admission: RE | Admit: 2022-05-13 | Discharge: 2022-05-13 | Disposition: A | Discharge status: Home / Self Care | Payer: Medicaid Other | Source: Ambulatory Visit | Attending: Family Medicine | Admitting: Family Medicine

## 2022-05-13 DIAGNOSIS — J189 Pneumonia, unspecified organism: Secondary | ICD-10-CM | POA: Diagnosis present

## 2022-05-13 MED ORDER — AMOXICILLIN-POT CLAVULANATE 875-125 MG PO TABS: 1.0000 | ORAL_TABLET | Freq: Two times a day (BID) | ORAL | 0 refills | Status: DC

## 2022-05-13 NOTE — Telephone Encounter (Signed)

## 2022-05-15 NOTE — Telephone Encounter (Signed)
[facility] states they are needing the most recent ov notes faxed to [phone].

## 2022-05-15 NOTE — Telephone Encounter (Signed)
Last OV faxed to number provided.

## 2022-05-17 ENCOUNTER — Telehealth: Payer: Self-pay | Admitting: Family Medicine

## 2022-05-17 ENCOUNTER — Encounter: Payer: Self-pay | Admitting: Family Medicine

## 2022-05-17 NOTE — Telephone Encounter (Signed)
[facility] states they also need a certificate of medical necessity. Please advise.

## 2022-05-17 NOTE — Telephone Encounter (Signed)
Patient would like to see if there is something cheaper he can try until he gets his insurance to replace the Incruse Ellipta inhaler?

## 2022-05-17 NOTE — Telephone Encounter (Signed)
Pt called to speak to sheketia about his medications. Please call back at [phone]

## 2022-05-20 ENCOUNTER — Encounter: Payer: Self-pay | Admitting: Family Medicine

## 2022-05-20 ENCOUNTER — Other Ambulatory Visit: Payer: Self-pay | Admitting: Family Medicine

## 2022-05-20 DIAGNOSIS — M25559 Pain in unspecified hip: Secondary | ICD-10-CM

## 2022-05-20 NOTE — Telephone Encounter (Signed)
Has been taking gabapentin as well and still has not worked for him.  He has been in tears.  Would like it sent into [facility].  Has doubled up on the oxycodone and still has not worked.

## 2022-05-21 MED ORDER — OXYCODONE HCL 5 MG PO TABS: 2.5000 mg | ORAL_TABLET | Freq: Three times a day (TID) | ORAL | 0 refills | Status: DC | PRN

## 2022-05-21 NOTE — Addendum Note (Signed)
Addended by: Darreld Mclean on: 05/21/2022 06:44 AM   Modules accepted: Orders

## 2022-05-29 ENCOUNTER — Encounter: Payer: Self-pay | Admitting: Family Medicine

## 2022-05-29 ENCOUNTER — Ambulatory Visit: Payer: Medicaid Other | Admitting: Internal Medicine

## 2022-05-29 DIAGNOSIS — J209 Acute bronchitis, unspecified: Secondary | ICD-10-CM

## 2022-05-29 MED ORDER — PREDNISONE 20 MG PO TABS: ORAL_TABLET | ORAL | 0 refills | Status: DC

## 2022-05-29 MED ORDER — DOXYCYCLINE HYCLATE 100 MG PO CAPS: 100.0000 mg | ORAL_CAPSULE | Freq: Two times a day (BID) | ORAL | 0 refills | Status: DC

## 2022-05-30 ENCOUNTER — Telehealth: Payer: Self-pay | Admitting: Family Medicine

## 2022-05-30 NOTE — Telephone Encounter (Addendum)
[facility] called stating that they needed to speak to pt's CMA to get information regarding pt's oxygen. Please Advise.  [place]

## 2022-05-31 ENCOUNTER — Telehealth: Payer: Self-pay | Admitting: Pharmacist

## 2022-05-31 NOTE — Telephone Encounter (Signed)
Received communication from [facility] thru the parachute site that they needed additional information about patient's O2 needs. Printed CMN paperwork. They need line 25 completed.  Tried to contact patient to get current O2 settings. No Answer / LM on VM.   Patient called back 5 minutes later.   O2 flow rate is up to 2 liters.

## 2022-05-31 NOTE — Telephone Encounter (Signed)
Discussed with [place]. Updated CMN form with line 25 completed. O2 rate at 2 to 3 L for 24 hours. Noted that O2 desated to 88% while patient in ED 02/16/2022.

## 2022-06-03 ENCOUNTER — Telehealth: Payer: Self-pay | Admitting: *Deleted

## 2022-06-03 NOTE — Telephone Encounter (Signed)
Prior auth started via cover my meds.  Awaiting determination.  Key: [id number]

## 2022-06-04 NOTE — Telephone Encounter (Signed)
Grant Galvan has spoken to [place].

## 2022-06-04 NOTE — Telephone Encounter (Signed)
Why did we deny your request? We denied this request under Medicare Part D because: The information received does not support approval under your Medicare Part D benefit; however, coverage or payment may be available under your Part A or Part B benefit. The Social Security Act states that a drug prescribed to a Part D eligible individual cannot be considered a covered Part D drug if payment for the drug is or would be available under Medicare Part A or Part B. Medicare Part B covers the requested drug when used in the home, or a home-type setting, with a nebulizer. Because the information we have shows that you will be using the requested drug in a nebulizer in your home, it cannot be covered under your Medicare Part D benefit. This request was denied under your Medicare Part D benefit; however, it may be covered under Medicare Part A or Part B. For more information, talk to your prescriber or call 1-800-MEDICARE. We hope this letter helps you understand why we denied your request. We based this decision on the coverage criteria for: [address]15 [employer] MEDD Prior Authorization Policy - Nebulized Drugs (albuterol, budesonide, ipratropium bromide, ipratropium/albuterol, formoterol) - B vs D You may obtain a copy of the criteria used to make this decision by sending a written request to [place]: Medicare Reviews [address] [place] [address] or by calling [phone]. TTY users, call [phone]. You should share a copy of this decision with your prescriber so you and your prescriber can discuss next steps. If your prescriber requested coverage on your behalf, we have shared this decision with your prescriber.

## 2022-06-10 NOTE — Telephone Encounter (Signed)
Pt called to follow up on PA.  I advised that it was denied because it has to be ran through on part b.  Pt stated that he has [employer].  I advised that he has to have part b for this to be covered.  He is going to call back his insurance rep.

## 2022-06-14 ENCOUNTER — Other Ambulatory Visit: Payer: Self-pay | Admitting: Family Medicine

## 2022-06-14 DIAGNOSIS — M25559 Pain in unspecified hip: Secondary | ICD-10-CM

## 2022-06-17 ENCOUNTER — Encounter: Payer: Self-pay | Admitting: Family Medicine

## 2022-06-17 ENCOUNTER — Other Ambulatory Visit: Payer: Self-pay | Admitting: Family Medicine

## 2022-06-17 DIAGNOSIS — M25559 Pain in unspecified hip: Secondary | ICD-10-CM

## 2022-06-17 NOTE — Telephone Encounter (Signed)
Requesting: Oxycodone Contract: 05/07/20218 UDS: 01/14/2022 Last Visit: 01/14/2022 Next Visit: N/A Last Refill: 05/21/2022  Please Advise

## 2022-06-20 ENCOUNTER — Telehealth: Payer: Self-pay | Admitting: Family Medicine

## 2022-06-20 ENCOUNTER — Other Ambulatory Visit: Payer: Self-pay | Admitting: Family Medicine

## 2022-06-20 NOTE — Telephone Encounter (Signed)
Per Dr. Lorelei Pont- he will have to wait until tomorrow.

## 2022-06-20 NOTE — Telephone Encounter (Signed)
Pt wanted to pick up his rx for oxyCODONE today, as he is trying to go out of town later this afternoon. He stated it is going to be bad weather tomorrow so he wanted to go ahead and leave today. This refill would just be a day early.

## 2022-07-16 ENCOUNTER — Telehealth: Payer: Self-pay | Admitting: Family Medicine

## 2022-07-16 DIAGNOSIS — M25559 Pain in unspecified hip: Secondary | ICD-10-CM

## 2022-07-16 DIAGNOSIS — J449 Chronic obstructive pulmonary disease, unspecified: Secondary | ICD-10-CM

## 2022-07-16 MED ORDER — OXYCODONE HCL 5 MG PO TABS: ORAL_TABLET | ORAL | 0 refills | Status: DC

## 2022-07-16 MED ORDER — FLUTICASONE-SALMETEROL 100-50 MCG/ACT IN AEPB: 1.0000 | INHALATION_SPRAY | Freq: Two times a day (BID) | RESPIRATORY_TRACT | 1 refills | Status: DC

## 2022-07-16 MED ORDER — INCRUSE ELLIPTA 62.5 MCG/ACT IN AEPB: 1.0000 | INHALATION_SPRAY | Freq: Every day | RESPIRATORY_TRACT | 1 refills | Status: DC

## 2022-07-16 MED ORDER — INCRUSE ELLIPTA 62.5 MCG/ACT IN AEPB: 1.0000 | INHALATION_SPRAY | Freq: Every day | RESPIRATORY_TRACT | 3 refills | Status: DC

## 2022-07-16 MED ORDER — FLUTICASONE-SALMETEROL 100-50 MCG/ACT IN AEPB: 1.0000 | INHALATION_SPRAY | Freq: Two times a day (BID) | RESPIRATORY_TRACT | 3 refills | Status: DC

## 2022-07-16 NOTE — Telephone Encounter (Signed)
Patient would like for [place] to send in his Incruse, Advair and pain medication.  I have already sent in the inhalers.    Todd at the pharmacy stated he was due for his oxycodone  on 07/20/18 (Saturday) but he will need a prior auth.  He would like for you to send this on in so they will have it and we can get the prior auth.  Last month his insurance would only pay for 7 days supply and he had to pay over $100 for the rest.    Also would you like for me to get patient an appointment, his last visit was 01/14/22 with you?

## 2022-07-16 NOTE — Telephone Encounter (Signed)
Patient called requesting a call back from [place], regarding his medication trouble. Please advice.

## 2022-07-18 ENCOUNTER — Telehealth: Payer: Self-pay | Admitting: Family Medicine

## 2022-07-18 NOTE — Telephone Encounter (Signed)
Patient called requesting a call back from [place] regarding his disability paperwork. Please advice.

## 2022-07-18 NOTE — Telephone Encounter (Signed)
Left message on machine to call back  

## 2022-07-19 ENCOUNTER — Encounter: Payer: Self-pay | Admitting: Family Medicine

## 2022-07-19 DIAGNOSIS — R911 Solitary pulmonary nodule: Secondary | ICD-10-CM

## 2022-07-19 NOTE — Telephone Encounter (Signed)
Unsure reasoning for xray.  Last CXR: 05/13/22 He had a CT Chest 2 View on 03/29/22. Last OV: 01/14/22 none scheduled.   He also has a CT Chest WO and CT Chest Lung cancer screen ordered in his chart already.

## 2022-07-22 NOTE — Telephone Encounter (Signed)
Left message on machine to call back  

## 2022-07-29 NOTE — Telephone Encounter (Signed)
Spoke with patient and he stated that he is still having trouble with disability.  The provider he saw in [place] has vanished and he is now unable to get his records.  He asked if she should call the medical board down in [place] and I advised that the can possibly try there.

## 2022-08-11 ENCOUNTER — Encounter: Payer: Self-pay | Admitting: Family Medicine

## 2022-08-11 DIAGNOSIS — M25559 Pain in unspecified hip: Secondary | ICD-10-CM

## 2022-08-12 ENCOUNTER — Telehealth: Payer: Self-pay | Admitting: Family Medicine

## 2022-08-12 MED ORDER — OXYCODONE HCL 5 MG PO TABS: ORAL_TABLET | ORAL | 0 refills | Status: DC

## 2022-08-12 NOTE — Telephone Encounter (Signed)
Patient is calling to request an order for a humidifier for his oxygen machine since it is drying him out. He stated [facility] told him to call PCP for the order. Pt also requested to speak to Apolonio Schneiders so she could do it, informed patient that Apolonio Schneiders is not Dr. Cyndie Chime CMA but the message would be routed to the right CMA.

## 2022-08-13 NOTE — Telephone Encounter (Signed)
Okay for this order? Pt has a pending appointment.

## 2022-08-14 ENCOUNTER — Ambulatory Visit (HOSPITAL_COMMUNITY): Payer: Medicaid Other

## 2022-08-14 NOTE — Telephone Encounter (Signed)
Taken care of, order to fax

## 2022-08-19 ENCOUNTER — Ambulatory Visit (HOSPITAL_COMMUNITY): Admission: RE | Admit: 2022-08-19 | Payer: Medicaid Other | Source: Ambulatory Visit

## 2022-08-21 NOTE — Telephone Encounter (Signed)
[facility] called stating they faxed over forms for the patient regarding his oxygent. Fax number confirmed, they will fax it again.    [phone]

## 2022-08-22 NOTE — Telephone Encounter (Signed)
Form was faxed 08/21/22 at 5:07 PM, faxed again this morning.

## 2022-08-23 ENCOUNTER — Other Ambulatory Visit: Payer: Self-pay | Admitting: Family Medicine

## 2022-08-25 ENCOUNTER — Encounter (HOSPITAL_COMMUNITY): Payer: Self-pay | Admitting: Emergency Medicine

## 2022-08-25 ENCOUNTER — Emergency Department (HOSPITAL_COMMUNITY): Payer: Medicare Other

## 2022-08-25 ENCOUNTER — Emergency Department (HOSPITAL_COMMUNITY)
Admission: EM | Admit: 2022-08-25 | Discharge: 2022-08-25 | Disposition: A | Discharge status: Home / Self Care | Payer: Medicare Other | Attending: Emergency Medicine | Admitting: Emergency Medicine

## 2022-08-25 ENCOUNTER — Other Ambulatory Visit: Payer: Self-pay

## 2022-08-25 DIAGNOSIS — J441 Chronic obstructive pulmonary disease with (acute) exacerbation: Secondary | ICD-10-CM | POA: Diagnosis not present

## 2022-08-25 DIAGNOSIS — R0602 Shortness of breath: Secondary | ICD-10-CM | POA: Diagnosis present

## 2022-08-25 MED ORDER — OXYCODONE-ACETAMINOPHEN 5-325 MG PO TABS
1.0000 | ORAL_TABLET | Freq: Once | ORAL | Status: AC
  Administered 2022-08-25: 1 via ORAL
  Filled 2022-08-25: qty 1

## 2022-08-25 MED ORDER — ALBUTEROL SULFATE (2.5 MG/3ML) 0.083% IN NEBU
INHALATION_SOLUTION | RESPIRATORY_TRACT | Status: AC
  Administered 2022-08-25: 5 mg
  Filled 2022-08-25: qty 6

## 2022-08-25 MED ORDER — IPRATROPIUM BROMIDE 0.02 % IN SOLN
RESPIRATORY_TRACT | Status: AC
  Administered 2022-08-25: 0.5 mg
  Filled 2022-08-25: qty 2.5

## 2022-08-25 MED ORDER — ALBUTEROL SULFATE (2.5 MG/3ML) 0.083% IN NEBU
INHALATION_SOLUTION | RESPIRATORY_TRACT | Status: AC
  Administered 2022-08-25: 2.5 mg
  Filled 2022-08-25: qty 3

## 2022-08-25 MED ORDER — PREDNISONE 50 MG PO TABS
60.0000 mg | ORAL_TABLET | Freq: Once | ORAL | Status: AC
  Administered 2022-08-25: 60 mg via ORAL
  Filled 2022-08-25: qty 1

## 2022-08-25 MED ORDER — IPRATROPIUM-ALBUTEROL 0.5-2.5 (3) MG/3ML IN SOLN
3.0000 mL | Freq: Once | RESPIRATORY_TRACT | Status: AC
  Administered 2022-08-25: 3 mL via RESPIRATORY_TRACT
  Filled 2022-08-25: qty 3

## 2022-08-25 MED ORDER — OXYCODONE HCL 5 MG PO TABS
5.0000 mg | ORAL_TABLET | Freq: Once | ORAL | Status: AC
  Administered 2022-08-25: 5 mg via ORAL
  Filled 2022-08-25: qty 1

## 2022-08-25 MED ORDER — AZITHROMYCIN 250 MG PO TABS: 250.0000 mg | ORAL_TABLET | Freq: Every day | ORAL | 0 refills | Status: DC

## 2022-08-25 MED ORDER — PREDNISONE 50 MG PO TABS: 50.0000 mg | ORAL_TABLET | Freq: Every day | ORAL | 0 refills | Status: DC

## 2022-08-25 MED ORDER — ALBUTEROL SULFATE HFA 108 (90 BASE) MCG/ACT IN AERS: 2.0000 | INHALATION_SPRAY | RESPIRATORY_TRACT | Status: DC | PRN

## 2022-08-25 NOTE — ED Triage Notes (Signed)
Pt with hx of COPD who called EMS for difficulty breathing. EMS states O2 sats upon their arrival were 95% with pt on his "normal O2 of 1-1.5L [place]).EMS states pt had wheezing and ronchi throughout. Pt states he used his inhaler twice prior to EMS arrival without relief. Pt also endorses cough and states he thinks he has Pneumonia again. EMS states they gave pt 5mg  Albuterol neb followed by a Duo-neb. Pt refused IV or CBG as he is "afraid of needles".

## 2022-08-25 NOTE — ED Provider Notes (Signed)
[place] EMERGENCY DEPARTMENT AT [facility] Provider Note   CSN: [id number] Arrival date & time: 08/25/22  6962     History  Chief Complaint  Patient presents with   Shortness of Breath    Grant Galvan is a 62 y.o. male.  The history is provided by the patient and the EMS personnel.  Shortness of Breath He has history of COPD and comes in because of worsening shortness of breath.  He has had increased shortness of breath over the last 3 days associated with cough productive of yellow sputum.  He denies fever or chills but did break out in sweats tonight.  He was brought in by ambulance and was given albuterol and ipratropium by nebulizer, but refused needles.   Home Medications Prior to Admission medications   Medication Sig Start Date End Date Taking? Authorizing Provider  albuterol (VENTOLIN HFA) 108 (90 Base) MCG/ACT inhaler Inhale 2 puffs into the lungs every 6 (six) hours as needed for wheezing or shortness of breath. 08/23/22   Copland, Gwenlyn Found, MD  ALPRAZolam Prudy Feeler) 1 MG tablet Take 1/2 or 1 by mouth for anxiety 30 minutes prior to blood draw 01/16/22   Copland, Gwenlyn Found, MD  amLODipine (NORVASC) 10 MG tablet Take 1 tablet (10 mg total) by mouth daily. 05/03/22   Copland, Gwenlyn Found, MD  benzonatate (TESSALON) 100 MG capsule Take 1 capsule (100 mg total) by mouth 3 (three) times daily as needed for cough. 03/28/22   Pricilla Loveless, MD  clotrimazole-betamethasone (LOTRISONE) cream Apply 1 Application topically 2 (two) times daily. 11/29/21   Jacquelin Hawking, PA-C  doxycycline (VIBRAMYCIN) 100 MG capsule Take 1 capsule (100 mg total) by mouth 2 (two) times daily. 05/29/22   Copland, Gwenlyn Found, MD  fluticasone-salmeterol (ADVAIR) 100-50 MCG/ACT AEPB Inhale 1 puff into the lungs 2 (two) times daily. 07/16/22   Copland, Gwenlyn Found, MD  gabapentin (NEURONTIN) 300 MG capsule Take 1 capsule (300 mg total) by mouth 3 (three) times daily. 01/22/22   Copland, Gwenlyn Found, MD   guaiFENesin (MUCINEX) 600 MG 12 hr tablet Take 1 tablet (600 mg total) by mouth 2 (two) times daily. 02/14/22 02/14/23  Shon Hale, MD  hydrochlorothiazide (HYDRODIURIL) 25 MG tablet Take 1 tablet (25 mg total) by mouth daily. 04/22/22   Copland, Gwenlyn Found, MD  ibuprofen (ADVIL) 800 MG tablet Take 1 tablet (800 mg total) by mouth 3 (three) times daily. 03/28/22   Pricilla Loveless, MD  ipratropium-albuterol (DUONEB) 0.5-2.5 (3) MG/3ML SOLN Take 3 mLs by nebulization every 6 (six) hours as needed. 04/26/22   Copland, Gwenlyn Found, MD  methocarbamol 1000 MG TABS Take 1,000 mg by mouth every 8 (eight) hours as needed for muscle spasms. 03/28/22   Pricilla Loveless, MD  oxyCODONE (OXY IR/ROXICODONE) 5 MG immediate release tablet TAKE ONE-HALF TO ONE TABLET (2.5 TO 5MG  TOTAL) BY MOUTH EVERY 8 HOURS AS NEEDED FOR SEVERE PAIN 08/12/22   Copland, Gwenlyn Found, MD  oxyCODONE-acetaminophen (PERCOCET/ROXICET) 5-325 MG tablet Take 1 tablet by mouth every 6 (six) hours as needed for severe pain. 03/28/22   Pricilla Loveless, MD  predniSONE (DELTASONE) 20 MG tablet Take 40 mg by mouth daily for 3 days, then 20 mg by mouth daily for 3 days 05/29/22   Copland, Gwenlyn Found, MD  triamcinolone cream (KENALOG) 0.5 % Apply topically.    [provider]  umeclidinium bromide (INCRUSE ELLIPTA) 62.5 MCG/ACT AEPB Inhale 1 puff into the lungs daily. 07/16/22   Copland, Shanda Bumps  C, MD      Allergies    Celecoxib, Cyclobenzaprine hcl, and Tramadol    Review of Systems   Review of Systems  Respiratory:  Positive for shortness of breath.   All other systems reviewed and are negative.   Physical Exam Updated Vital Signs BP (!) 154/86 (BP Location: Left Arm)   Pulse 81   Temp 97.9 F (36.6 C) (Oral)   Resp 20   Ht 6\' 3"  (1.905 m)   Wt 73 kg   SpO2 99%   BMI 20.12 kg/m  Physical Exam Vitals and nursing note reviewed.   62 year old male, resting comfortably and in no acute distress. Vital signs are significant for  elevated blood pressure. Oxygen saturation is 99%, which is normal. Head is normocephalic and atraumatic. PERRLA, EOMI. Oropharynx is clear. Neck is nontender and supple without adenopathy or JVD. Back is nontender and there is no CVA tenderness. Lungs have diminished airflow with faint expiratory wheezes diffusely.  There are no rales or rhonchi. Chest is nontender. Heart has regular rate and rhythm without murmur. Abdomen is soft, flat, nontender. Extremities have no cyanosis or edema, full range of motion is present. Skin is warm and dry without rash. Neurologic: Mental status is normal, cranial nerves are intact, moves all extremities equally.  ED Results / Procedures / Treatments    EKG EKG Interpretation  Date/Time:  Sunday Aug 25 2022 02:23:31 EDT Ventricular Rate:  81 PR Interval:    QRS Duration: 107 QT Interval:  396 QTC Calculation: 460 R Axis:   82 Text Interpretation: Sinus rhythm Nonspecific T abnrm, anterolateral leads Baseline wander in lead(s) V2 When compared with ECG of 03/28/2022, No significant change was found Confirmed by Dione Booze ([id number]) on 08/25/2022 3:15:03 AM  Radiology DG Chest Portable 1 View  Result Date: 08/25/2022 CLINICAL DATA:  Shortness of breath. EXAM: PORTABLE CHEST 1 VIEW COMPARISON:  May 13, 2022 FINDINGS: The heart size and mediastinal contours are within normal limits. The lungs are hyperinflated. Emphysematous lung disease is seen with diffuse, chronic appearing increased interstitial lung markings and mild to moderate severity biapical scarring and/or atelectasis. There is no evidence of acute infiltrate, pleural effusion or pneumothorax. The visualized skeletal structures are unremarkable. IMPRESSION: COPD with mild to moderate severity biapical scarring and/or atelectasis. Electronically Signed   By: Aram Candela M.D.   On: 08/25/2022 02:45    Procedures Procedures    Medications Ordered in ED Medications  albuterol  (VENTOLIN HFA) 108 (90 Base) MCG/ACT inhaler 2 puff (has no administration in time range)    ED Course/ Medical Decision Making/ A&P                             Medical Decision Making Amount and/or Complexity of Data Reviewed Radiology: ordered.  Risk Prescription drug management.   COPD exacerbation, possible pneumonia.  Chest x-ray shows evidence of COPD without evidence of pneumonia.  Have independently viewed the image, and agree with radiologist's interpretation.  I have ordered oral prednisone and additional albuterol and ipratropium by nebulizer.  I have reviewed his past records, and I note several ED visits for COPD exacerbation with the most recent on 02/16/2022.  I see no hospital admissions for COPD.  I have reviewed and interpreted his electrocardiogram, and my interpretation is sinus rhythm with nonspecific T wave changes which are unchanged from prior.  He feels much better following nebulizer treatment.  On reexam,  wheezing is no longer heard.  I am discharging him with a prescription for a 5-day course of prednisone and also prescription for azithromycin.  Follow-up with PCP in 1 week.  Final Clinical Impression(s) / ED Diagnoses Final diagnoses:  COPD exacerbation (HCC)    Rx / DC Orders ED Discharge Orders          Ordered    predniSONE (DELTASONE) 50 MG tablet  Daily        08/25/22 0506    azithromycin (ZITHROMAX) 250 MG tablet  Daily        08/25/22 0506              Dione Booze, MD 08/25/22 [phone]

## 2022-08-25 NOTE — ED Notes (Signed)
Gave 2 nebs with extra albuterol and Atrovent, back to back. Patient has congested cough. Still smokes.

## 2022-08-28 ENCOUNTER — Other Ambulatory Visit: Payer: Self-pay

## 2022-08-28 ENCOUNTER — Telehealth: Payer: Self-pay | Admitting: Family Medicine

## 2022-08-28 ENCOUNTER — Ambulatory Visit: Payer: Medicaid Other | Admitting: Family Medicine

## 2022-08-28 MED ORDER — NEBULIZER/TUBING/MOUTHPIECE KIT: 1.0000 | PACK | 0 refills | Status: DC

## 2022-08-28 NOTE — Telephone Encounter (Signed)
Rx faxed for [employer].

## 2022-08-28 NOTE — Telephone Encounter (Signed)
Pt said that [employer] [facility] is coming out tomorrow to service his oxygen tank and he needs a nebulizer kit. [facility] advised pt if we send over the order for nebulizer kit, they can have it brought to him during his visit tomorrow. Please send order to [facility]

## 2022-09-02 NOTE — Progress Notes (Unsigned)
[facility] at [facility] [address], [address] [place], [place] [address] [phone]  Date:  09/04/2022   Name:  Grant Galvan   DOB:  [birth year]   MRN:  [id number]  PCP:  Pearline Cables, MD    Chief Complaint: No chief complaint on file.   History of Present Illness:  Grant Galvan is a 62 y.o. 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  Also COPD, vascular necrosis of hips, hypertension, smoking Patient endorses a severe needle phobia and has refused any and all blood work I ordered a CT lung cancer screening last year but I do not think this has been completed Will update UDS today  Patient Active Problem List   Diagnosis Date Noted   Ascending aortic aneurysm (HCC) 04/03/2022   COPD with acute exacerbation (HCC) 02/14/2022   Nicotine abuse/Smoker 02/14/2022   Compression fracture of first lumbar vertebra (HCC) 02/14/2022   Avascular necrosis of bone of hip, right (HCC) 02/14/2022   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    No past surgical history on file.  Social History   Tobacco Use   Smoking status: Every Day    Packs/day: .5    Types: Cigarettes   Smokeless tobacco: Former  Building services engineer Use: Never used  Substance Use Topics   Alcohol use: No    Comment: gave up alcohol  1995.  hx heavy   Drug use: Not Currently    Family History  Problem Relation Age of Onset   Cancer Mother    Stroke Mother    COPD Father    Cancer Father 32       prostate   Cancer Brother 21       colon    Allergies  Allergen Reactions   Celecoxib Other (See Comments)    Stopped breathing   Cyclobenzaprine Hcl Hives   Tramadol Diarrhea    Medication list has been reviewed and updated.  Current Outpatient Medications on File Prior to Visit  Medication Sig Dispense Refill   albuterol (VENTOLIN HFA) 108 (90 Base) MCG/ACT inhaler Inhale 2 puffs into the lungs every 6 (six) hours as needed for wheezing or shortness of breath. 18 g 5   ALPRAZolam (XANAX) 1 MG tablet Take 1/2 or 1 by mouth for anxiety 30 minutes prior to blood draw 5 tablet 0   amLODipine (NORVASC) 10 MG tablet Take 1 tablet (10 mg total) by mouth daily. 90 tablet 3   azithromycin (ZITHROMAX) 250 MG tablet Take 1 tablet (250 mg total) by mouth daily. Take first 2 tablets together, then 1 every day until finished. 6 tablet 0   clotrimazole-betamethasone (LOTRISONE) cream Apply 1 Application topically 2 (two) times daily. 45 g 0   fluticasone-salmeterol (ADVAIR) 100-50 MCG/ACT AEPB Inhale 1 puff into the lungs 2 (  two) times daily. 180 each 3   gabapentin (NEURONTIN) 300 MG capsule Take 1 capsule (300 mg total) by mouth 3 (three) times daily. 90 capsule 3   guaiFENesin (MUCINEX) 600 MG 12 hr tablet Take 1 tablet (600 mg total) by mouth 2 (two) times daily. 60 tablet 2   hydrochlorothiazide (HYDRODIURIL) 25 MG tablet Take 1 tablet (25 mg total) by mouth daily. 30 tablet 3   ibuprofen (ADVIL) 800 MG tablet Take 1 tablet (800 mg total) by mouth 3 (three) times daily. 21 tablet 0   ipratropium-albuterol (DUONEB) 0.5-2.5 (3) MG/3ML SOLN Take 3 mLs by nebulization every 6 (six) hours as needed. 360 mL 3   methocarbamol 1000 MG TABS Take 1,000 mg by mouth every 8 (eight) hours as needed for muscle spasms. 21 tablet 0    oxyCODONE (OXY IR/ROXICODONE) 5 MG immediate release tablet TAKE ONE-HALF TO ONE TABLET (2.5 TO 5MG  TOTAL) BY MOUTH EVERY 8 HOURS AS NEEDED FOR SEVERE PAIN 90 tablet 0   oxyCODONE-acetaminophen (PERCOCET/ROXICET) 5-325 MG tablet Take 1 tablet by mouth every 6 (six) hours as needed for severe pain. 8 tablet 0   predniSONE (DELTASONE) 50 MG tablet Take 1 tablet (50 mg total) by mouth daily. 5 tablet 0   Respiratory Therapy Supplies (NEBULIZER/TUBING/MOUTHPIECE) KIT 1 each by Does not apply route as directed. 1 kit 0   triamcinolone cream (KENALOG) 0.5 % Apply topically.     umeclidinium bromide (INCRUSE ELLIPTA) 62.5 MCG/ACT AEPB Inhale 1 puff into the lungs daily. 90 each 3   No current facility-administered medications on file prior to visit.    Review of Systems:  As per HPI- otherwise negative.   Physical Examination: There were no vitals filed for this visit. There were no vitals filed for this visit. There is no height or weight on file to calculate BMI. Ideal Body Weight:    GEN: no acute distress. HEENT: Atraumatic, Normocephalic.  Ears and Nose: No external deformity. CV: RRR, No M/G/R. No JVD. No thrill. No extra heart sounds. PULM: CTA B, no wheezes, crackles, rhonchi. No retractions. No resp. distress. No accessory muscle use. ABD: S, NT, ND, +BS. No rebound. No HSM. EXTR: No c/c/e PSYCH: Normally interactive. Conversant.    Assessment and Plan: ***  Signed Abbe Amsterdam, MD

## 2022-09-04 ENCOUNTER — Ambulatory Visit (INDEPENDENT_AMBULATORY_CARE_PROVIDER_SITE_OTHER): Payer: Medicare Other | Admitting: Family Medicine

## 2022-09-04 ENCOUNTER — Encounter: Payer: Self-pay | Admitting: Family Medicine

## 2022-09-04 ENCOUNTER — Ambulatory Visit (HOSPITAL_BASED_OUTPATIENT_CLINIC_OR_DEPARTMENT_OTHER)
Admission: RE | Admit: 2022-09-04 | Discharge: 2022-09-04 | Disposition: A | Discharge status: Home / Self Care | Payer: Medicare Other | Source: Ambulatory Visit | Attending: Family Medicine | Admitting: Family Medicine

## 2022-09-04 VITALS — BP 112/80 | HR 89 | Temp 97.7°F | Resp 22 | Ht 74.0 in | Wt 161.8 lb

## 2022-09-04 DIAGNOSIS — R0902 Hypoxemia: Secondary | ICD-10-CM

## 2022-09-04 DIAGNOSIS — G8929 Other chronic pain: Secondary | ICD-10-CM | POA: Diagnosis not present

## 2022-09-04 DIAGNOSIS — R600 Localized edema: Secondary | ICD-10-CM

## 2022-09-04 DIAGNOSIS — J441 Chronic obstructive pulmonary disease with (acute) exacerbation: Secondary | ICD-10-CM

## 2022-09-04 DIAGNOSIS — Z9981 Dependence on supplemental oxygen: Secondary | ICD-10-CM

## 2022-09-04 MED ORDER — HYDROCHLOROTHIAZIDE 25 MG PO TABS: 25.0000 mg | ORAL_TABLET | Freq: Every day | ORAL | 3 refills | Status: DC

## 2022-09-04 MED ORDER — PREDNISONE 20 MG PO TABS
ORAL_TABLET | ORAL | 0 refills | Status: DC
Start: 2022-09-04 — End: 2022-12-04

## 2022-09-04 MED ORDER — DOXYCYCLINE HYCLATE 100 MG PO CAPS
100.0000 mg | ORAL_CAPSULE | Freq: Two times a day (BID) | ORAL | 0 refills | Status: DC
Start: 2022-09-04 — End: 2022-11-15

## 2022-09-04 MED ORDER — PROMETHAZINE-DM 6.25-15 MG/5ML PO SYRP
2.5000 mL | ORAL_SOLUTION | Freq: Four times a day (QID) | ORAL | 0 refills | Status: DC | PRN
Start: 2022-09-04 — End: 2023-07-07

## 2022-09-04 NOTE — Patient Instructions (Addendum)
I am sorry you are not feeling well!  I will call [employer] and make sure we get them what they need Start on doxycycline for 10 days and go back on the oral steroids Please stop by the ground floor and get a chest x-ray I sent in an rx for promethazine DM cough syrup - this can make you a bit drowsy so use with caution

## 2022-09-06 ENCOUNTER — Encounter (INDEPENDENT_AMBULATORY_CARE_PROVIDER_SITE_OTHER): Payer: Medicare Other | Admitting: Family Medicine

## 2022-09-06 DIAGNOSIS — M25559 Pain in unspecified hip: Secondary | ICD-10-CM

## 2022-09-06 DIAGNOSIS — G894 Chronic pain syndrome: Secondary | ICD-10-CM

## 2022-09-08 MED ORDER — OXYCODONE HCL 5 MG PO TABS
ORAL_TABLET | ORAL | 0 refills | Status: DC
Start: 2022-09-08 — End: 2022-11-18

## 2022-09-08 NOTE — Addendum Note (Signed)
Addended by: Abbe Amsterdam C on: 09/08/2022 06:05 AM   Modules accepted: Orders

## 2022-09-16 ENCOUNTER — Telehealth: Payer: Self-pay | Admitting: Family Medicine

## 2022-09-16 DIAGNOSIS — G894 Chronic pain syndrome: Secondary | ICD-10-CM

## 2022-09-16 MED ORDER — DULOXETINE HCL 60 MG PO CPEP
60.0000 mg | ORAL_CAPSULE | Freq: Every day | ORAL | 3 refills | Status: DC
Start: 2022-09-16 — End: 2023-07-11

## 2022-09-16 NOTE — Telephone Encounter (Signed)
Please see the MyChart message reply(ies) for my assessment and plan.  The patient gave consent for this Medical Advice Message and is aware that it may result in a bill to their insurance company as well as the possibility that this may result in a co-payment or deductible. They are an established patient, but are not seeking medical advice exclusively about a problem treated during an in person or video visit in the last 7 days. I did not recommend an in person or video visit within 7 days of my reply.  I spent a total of 30 minutes cumulative time within 7 days through [employer] Abbe Amsterdam, MD

## 2022-09-16 NOTE — Telephone Encounter (Signed)
Ted from [employer] has called to inform us that they faxed over a medical neccessity form that needs to be signed for the patietns oxygen tank. Please sign and fax back he said.  [facility] Fax: [phone]

## 2022-09-16 NOTE — Addendum Note (Signed)
Addended by: Abbe Amsterdam C on: 09/16/2022 05:30 PM   Modules accepted: Orders

## 2022-09-16 NOTE — Telephone Encounter (Signed)
Form has been faxed.

## 2022-09-21 MED ORDER — OXYCODONE HCL ER 10 MG PO T12A: 10.0000 mg | EXTENDED_RELEASE_TABLET | Freq: Two times a day (BID) | ORAL | 0 refills | Status: DC

## 2022-09-21 NOTE — Addendum Note (Signed)
Addended by: Abbe Amsterdam C on: 09/21/2022 12:09 PM   Modules accepted: Orders

## 2022-09-23 ENCOUNTER — Telehealth: Payer: Self-pay

## 2022-09-23 NOTE — Telephone Encounter (Signed)
PA initiated via Covermymeds; KEY: [id number]. Awaiting determination.

## 2022-09-23 NOTE — Telephone Encounter (Signed)
PA approved.   [id number];Review Type:Prior Auth;Coverage Start Date:08/24/2022;Coverage End Date:09/23/2023;

## 2022-09-24 MED ORDER — OXYCODONE HCL ER 10 MG PO T12A: 10.0000 mg | EXTENDED_RELEASE_TABLET | Freq: Two times a day (BID) | ORAL | 0 refills | Status: DC

## 2022-09-24 NOTE — Telephone Encounter (Signed)
Please see the MyChart message reply(ies) for my assessment and plan.  The patient gave consent for this Medical Advice Message and is aware that it may result in a bill to their insurance company as well as the possibility that this may result in a co-payment or deductible. They are an established patient, but are not seeking medical advice exclusively about a problem treated during an in person or video visit in the last 7 days. I did not recommend an in person or video visit within 7 days of my reply.  I spent a total of 30 minutes cumulative time within 7 days through MyChart messaging Khalel Alms, MD  

## 2022-09-24 NOTE — Addendum Note (Signed)
Addended by: Abbe Amsterdam C on: 09/24/2022 01:14 PM   Modules accepted: Orders

## 2022-10-07 DIAGNOSIS — J441 Chronic obstructive pulmonary disease with (acute) exacerbation: Secondary | ICD-10-CM | POA: Diagnosis not present

## 2022-10-08 ENCOUNTER — Other Ambulatory Visit: Payer: Self-pay | Admitting: Family Medicine

## 2022-10-10 ENCOUNTER — Encounter (INDEPENDENT_AMBULATORY_CARE_PROVIDER_SITE_OTHER): Payer: HMO | Admitting: Family Medicine

## 2022-10-10 DIAGNOSIS — J449 Chronic obstructive pulmonary disease, unspecified: Secondary | ICD-10-CM

## 2022-10-10 DIAGNOSIS — M25559 Pain in unspecified hip: Secondary | ICD-10-CM

## 2022-10-10 DIAGNOSIS — J441 Chronic obstructive pulmonary disease with (acute) exacerbation: Secondary | ICD-10-CM

## 2022-10-10 MED ORDER — DOXYCYCLINE HYCLATE 100 MG PO CAPS
100.0000 mg | ORAL_CAPSULE | Freq: Two times a day (BID) | ORAL | 0 refills | Status: DC
Start: 2022-10-10 — End: 2022-11-15

## 2022-10-10 NOTE — Telephone Encounter (Signed)
Please see the MyChart message reply(ies) for my assessment and plan.  The patient gave consent for this Medical Advice Message and is aware that it may result in a bill to their insurance company as well as the possibility that this may result in a co-payment or deductible. They are an established patient, but are not seeking medical advice exclusively about a problem treated during an in person or video visit in the last 7 days. I did not recommend an in person or video visit within 7 days of my reply.  I spent a total of 10 minutes cumulative time within 7 days through MyChart messaging Darcus Edds, MD  

## 2022-10-10 NOTE — Telephone Encounter (Signed)
Please see below.

## 2022-10-10 NOTE — Telephone Encounter (Signed)
Pt was seen on 09/09/22: dx with COPD exacerbation (HCC) - Plan: doxycycline (VIBRAMYCIN) 100 MG capsule, DG Chest 2 View, predniSONE (DELTASONE) 20 MG tablet, promethazine-dextromethorphan (PROMETHAZINE-DM) 6.25-15 MG/5ML syrup.  Message sent to elaborate on sxs. Also made him aware that you are out of the office. Please advise when available.

## 2022-10-15 ENCOUNTER — Other Ambulatory Visit: Payer: Self-pay

## 2022-10-15 DIAGNOSIS — M25559 Pain in unspecified hip: Secondary | ICD-10-CM

## 2022-10-15 NOTE — Telephone Encounter (Signed)
Could this be sent in- the immediate release version?

## 2022-10-17 MED ORDER — OXYCODONE HCL 5 MG PO TABS
2.5000 mg | ORAL_TABLET | Freq: Four times a day (QID) | ORAL | 0 refills | Status: DC | PRN
Start: 2022-10-17 — End: 2023-03-10

## 2022-10-17 NOTE — Addendum Note (Signed)
Addended by: Abbe Amsterdam C on: 10/17/2022 06:44 AM   Modules accepted: Orders

## 2022-11-01 ENCOUNTER — Ambulatory Visit (HOSPITAL_COMMUNITY): Payer: Medicaid Other

## 2022-11-07 DIAGNOSIS — J441 Chronic obstructive pulmonary disease with (acute) exacerbation: Secondary | ICD-10-CM | POA: Diagnosis not present

## 2022-11-12 DIAGNOSIS — J449 Chronic obstructive pulmonary disease, unspecified: Secondary | ICD-10-CM | POA: Diagnosis not present

## 2022-11-12 MED ORDER — ALBUTEROL SULFATE HFA 108 (90 BASE) MCG/ACT IN AERS: 2.0000 | INHALATION_SPRAY | Freq: Four times a day (QID) | RESPIRATORY_TRACT | 5 refills | Status: DC | PRN

## 2022-11-12 MED ORDER — IPRATROPIUM-ALBUTEROL 0.5-2.5 (3) MG/3ML IN SOLN
3.0000 mL | Freq: Four times a day (QID) | RESPIRATORY_TRACT | 3 refills | Status: DC | PRN
Start: 2022-11-12 — End: 2023-09-08

## 2022-11-12 NOTE — Addendum Note (Signed)
Addended by: Abbe Amsterdam C on: 11/12/2022 02:43 PM   Modules accepted: Orders

## 2022-11-15 MED ORDER — DOXYCYCLINE HYCLATE 100 MG PO TABS: 100.0000 mg | ORAL_TABLET | Freq: Two times a day (BID) | ORAL | 0 refills | Status: DC

## 2022-11-15 NOTE — Telephone Encounter (Signed)
Please see the MyChart message reply(ies) for my assessment and plan.  The patient gave consent for this Medical Advice Message and is aware that it may result in a bill to their insurance company as well as the possibility that this may result in a co-payment or deductible. They are an established patient, but are not seeking medical advice exclusively about a problem treated during an in person or video visit in the last 7 days. I did not recommend an in person or video visit within 7 days of my reply.  I spent a total of 24 minutes cumulative time within 7 days through [employer] Abbe Amsterdam, MD

## 2022-11-15 NOTE — Addendum Note (Signed)
Addended by: Abbe Amsterdam C on: 11/15/2022 03:05 PM   Modules accepted: Orders

## 2022-11-18 MED ORDER — OXYCODONE HCL 5 MG PO TABS
ORAL_TABLET | ORAL | 0 refills | Status: DC
Start: 2022-11-18 — End: 2022-12-16

## 2022-11-18 NOTE — Addendum Note (Signed)
Addended by: Abbe Amsterdam C on: 11/18/2022 09:26 AM   Modules accepted: Orders

## 2022-11-18 NOTE — Telephone Encounter (Signed)
Okay to fill pain meds?

## 2022-12-04 DIAGNOSIS — J441 Chronic obstructive pulmonary disease with (acute) exacerbation: Secondary | ICD-10-CM | POA: Diagnosis not present

## 2022-12-04 MED ORDER — PREDNISONE 20 MG PO TABS
ORAL_TABLET | ORAL | 0 refills | Status: DC
Start: 2022-12-04 — End: 2023-02-14

## 2022-12-04 MED ORDER — DOXYCYCLINE HYCLATE 100 MG PO CAPS: 100.0000 mg | ORAL_CAPSULE | Freq: Two times a day (BID) | ORAL | 0 refills | Status: DC

## 2022-12-04 NOTE — Addendum Note (Signed)
Addended by: Abbe Amsterdam C on: 12/04/2022 08:55 PM   Modules accepted: Orders

## 2022-12-04 NOTE — Telephone Encounter (Signed)

## 2022-12-08 DIAGNOSIS — J441 Chronic obstructive pulmonary disease with (acute) exacerbation: Secondary | ICD-10-CM | POA: Diagnosis not present

## 2022-12-13 DIAGNOSIS — J449 Chronic obstructive pulmonary disease, unspecified: Secondary | ICD-10-CM | POA: Diagnosis not present

## 2022-12-16 ENCOUNTER — Other Ambulatory Visit: Payer: Self-pay | Admitting: Family Medicine

## 2022-12-16 DIAGNOSIS — M25559 Pain in unspecified hip: Secondary | ICD-10-CM

## 2022-12-24 ENCOUNTER — Telehealth: Payer: Self-pay | Admitting: Family Medicine

## 2022-12-24 ENCOUNTER — Other Ambulatory Visit: Payer: Self-pay | Admitting: Family Medicine

## 2022-12-24 ENCOUNTER — Ambulatory Visit (HOSPITAL_COMMUNITY): Payer: Medicaid Other

## 2022-12-24 DIAGNOSIS — R911 Solitary pulmonary nodule: Secondary | ICD-10-CM

## 2022-12-24 NOTE — Telephone Encounter (Signed)
Handled.

## 2022-12-24 NOTE — Telephone Encounter (Signed)
Pt's spouse called & stated that they had to cancel their appointment they had made with the imaging dept. today due to lack of transportation. She stated that the imaging dept. Informed her and her husband that the CT order has been closed and Dr. Patsy Lager needs to send in another order for him as well for an appointment to be scheduled. Please advise pt when order can be sent in so he can reschedule.

## 2022-12-31 DIAGNOSIS — J449 Chronic obstructive pulmonary disease, unspecified: Secondary | ICD-10-CM | POA: Diagnosis not present

## 2023-01-02 ENCOUNTER — Encounter: Payer: Self-pay | Admitting: Family Medicine

## 2023-01-03 ENCOUNTER — Ambulatory Visit (HOSPITAL_COMMUNITY)
Admission: RE | Admit: 2023-01-03 | Discharge: 2023-01-03 | Disposition: A | Discharge status: Home / Self Care | Payer: HMO | Source: Ambulatory Visit | Attending: Family Medicine | Admitting: Family Medicine

## 2023-01-03 DIAGNOSIS — R911 Solitary pulmonary nodule: Secondary | ICD-10-CM | POA: Diagnosis not present

## 2023-01-03 DIAGNOSIS — R918 Other nonspecific abnormal finding of lung field: Secondary | ICD-10-CM | POA: Diagnosis not present

## 2023-01-03 DIAGNOSIS — I7121 Aneurysm of the ascending aorta, without rupture: Secondary | ICD-10-CM | POA: Diagnosis not present

## 2023-01-07 ENCOUNTER — Encounter: Payer: Self-pay | Admitting: Family Medicine

## 2023-01-07 DIAGNOSIS — J441 Chronic obstructive pulmonary disease with (acute) exacerbation: Secondary | ICD-10-CM | POA: Diagnosis not present

## 2023-01-08 MED ORDER — DOXYCYCLINE HYCLATE 100 MG PO CAPS: 100.0000 mg | ORAL_CAPSULE | Freq: Two times a day (BID) | ORAL | 0 refills | Status: DC

## 2023-01-08 NOTE — Telephone Encounter (Signed)
Please see the MyChart message reply(ies) for my assessment and plan.  The patient gave consent for this Medical Advice Message and is aware that it may result in a bill to their insurance company as well as the possibility that this may result in a co-payment or deductible. They are an established patient, but are not seeking medical advice exclusively about a problem treated during an in person or video visit in the last 7 days. I did not recommend an in person or video visit within 7 days of my reply.  I spent a total of 10 minutes cumulative time within 7 days through MyChart messaging Taiven Greenley, MD  

## 2023-01-09 ENCOUNTER — Encounter: Payer: Self-pay | Admitting: Family Medicine

## 2023-01-09 ENCOUNTER — Telehealth: Payer: Self-pay | Admitting: Family Medicine

## 2023-01-09 NOTE — Telephone Encounter (Signed)
Grant Galvan (spouse DPR Ok) called stating that the orders for his wheel walker and electric wheelchair had to be faxed from our office for [facility] to take them. Darlene provided the following fax #:  [facility] [phone]

## 2023-01-09 NOTE — Telephone Encounter (Signed)
Will postpone until pt and spouse respond to [employer]

## 2023-01-10 ENCOUNTER — Encounter: Payer: Self-pay | Admitting: Family Medicine

## 2023-01-10 ENCOUNTER — Telehealth: Payer: Self-pay | Admitting: Family Medicine

## 2023-01-10 DIAGNOSIS — M25559 Pain in unspecified hip: Secondary | ICD-10-CM

## 2023-01-10 MED ORDER — OXYCODONE HCL 5 MG PO TABS
ORAL_TABLET | ORAL | 0 refills | Status: DC
Start: 2023-01-10 — End: 2023-02-06

## 2023-01-10 NOTE — Telephone Encounter (Signed)
Letter that Dr Patsy Lager Drafted has been faxed to [employer].

## 2023-01-12 DIAGNOSIS — J449 Chronic obstructive pulmonary disease, unspecified: Secondary | ICD-10-CM | POA: Diagnosis not present

## 2023-01-13 ENCOUNTER — Encounter: Payer: Self-pay | Admitting: Family Medicine

## 2023-01-13 NOTE — Telephone Encounter (Signed)
Darlene called back and stated that [facility] is still waiting for face-to-face notes.

## 2023-01-13 NOTE — Telephone Encounter (Signed)
Michael from [facility] called to inform pcp that they cannot dispense both products for the patient. He mentioned that the pt needs to pick one or the other. Either the scooter or Rollator.   Casimiro Needle explained that if the Rollator is chosen, they would need the patient's demographics, face-to-face notes, and narratives for why he needs it. Additionally, they would need one order for the item and one order for why he needs it.   If the patient prefers the scooter, an order needs to be put in to the rehab department. The given contact information is [phone] (tele).    Casimiro Needle with [facility] telephone number is [phone].   Please advise.

## 2023-01-13 NOTE — Telephone Encounter (Signed)
Order for the scooter,  needs to be put in to the rehab department. The given contact information is [phone] (tele).      Grant Galvan with [facility] telephone number is [phone].

## 2023-01-14 ENCOUNTER — Encounter: Payer: Self-pay | Admitting: Family Medicine

## 2023-01-14 NOTE — Telephone Encounter (Signed)
Last f32f was 09/04/22. We will likely have to update a visit.

## 2023-01-17 NOTE — Telephone Encounter (Signed)
Grant Galvan from [facility] called back. I Informed him that Dr. Patsy Lager has already reached out to [facility] to discuss the next steps about getting the scooter . Casimiro Needle stated that he will put this in a soft void/ hold until they hear back from the patient or pcp when the patient is ready to move forward.

## 2023-01-21 DIAGNOSIS — J441 Chronic obstructive pulmonary disease with (acute) exacerbation: Secondary | ICD-10-CM | POA: Diagnosis not present

## 2023-01-27 ENCOUNTER — Other Ambulatory Visit: Payer: Self-pay | Admitting: Family Medicine

## 2023-01-27 ENCOUNTER — Encounter: Payer: Self-pay | Admitting: Family Medicine

## 2023-01-27 NOTE — Progress Notes (Signed)
CLINICAL DATA:  Follow-up lung nodules.  Shortness of breath.   EXAM: CT CHEST WITHOUT CONTRAST   TECHNIQUE: Multidetector CT imaging of the chest was performed following the standard protocol without IV contrast.   RADIATION DOSE REDUCTION: This exam was performed according to the departmental dose-optimization program which includes automated exposure control, adjustment of the mA and/or kV according to patient size and/or use of iterative reconstruction technique.   COMPARISON:  03/29/2022   FINDINGS: Cardiovascular: Heart is normal size. Calcified plaque over the left main and 3 vessel coronary arteries unchanged. Mild aneurysmal dilatation of the ascending thoracic aorta measuring 4 cm (previously 4.2 cm). Minimal calcified plaque over the thoracic aorta. Mild prominence of the main pulmonary arteries which can be seen with pulmonary arterial hypertension. Remaining vascular structures are unremarkable.   Mediastinum/Nodes: No significant mediastinal or hilar adenopathy. Remaining mediastinal structures are unremarkable.   Lungs/Pleura: Moderate to severe centrilobular emphysematous disease with associated moderate scarring over the upper lobes. Less associated airspace density over these areas scarring in the upper lobes compared to the prior exam. Resolution of previously seen irregular nodular opacity adjacent the right major fissure. Linear scarring over the lower lobes bilaterally unchanged. Stable 7-8 mm nodule over the lingula (image 112). No acute airspace process or effusion. Airways are otherwise unremarkable.   Upper Abdomen: Mild calcified plaque over the abdominal aorta. No acute findings in the upper abdomen.   Musculoskeletal: Stable mild compression fracture over the thoracolumbar junction. 4-5 compression fractures over the mid to upper thoracic spine which are new since the prior study from 2023.   IMPRESSION: 1. Moderate to severe centrilobular  emphysematous disease with associated moderate scarring over the upper lobes. Resolution of previously seen irregular nodular opacity adjacent the right major fissure. Stable 7-8 mm nodule over the lingula. Recommend an additional follow-up noncontrast chest CT in December 2025 to document 2 years of stability. This recommendation follows the consensus statement: Guidelines for Management of Small Pulmonary Nodules Detected on CT Scans: A Statement from the Fleischner Society as published in Radiology 2005; 237:395-400. Online at: DietDisorder.cz. 2. Aortic atherosclerosis. Atherosclerotic coronary artery disease. 3. Mild aneurysmal dilatation of the ascending thoracic aorta measuring 4 cm (previously 4.2 cm). Recommend annual imaging followup by CTA or MRA. This recommendation follows 2010 ACCF/AHA/AATS/ACR/ASA/SCA/SCAI/SIR/STS/SVM Guidelines for the Diagnosis and Management of Patients with Thoracic Aortic Disease. Circulation. 2010; 121: Y865-H846. Aortic aneurysm NOS (ICD10-I71.9). 4. Mild prominence of the main pulmonary arteries which can be seen with pulmonary arterial hypertension. 5. 4-5 compression fractures over the mid to upper thoracic spine which are new since the prior study from 2023. Stable mild compression fracture over the thoracolumbar junction.   Aortic Atherosclerosis (ICD10-I70.0) and Emphysema (ICD10-J43.9).

## 2023-01-28 ENCOUNTER — Other Ambulatory Visit: Payer: Self-pay | Admitting: Family Medicine

## 2023-01-28 ENCOUNTER — Encounter: Payer: Self-pay | Admitting: Family Medicine

## 2023-01-28 DIAGNOSIS — R911 Solitary pulmonary nodule: Secondary | ICD-10-CM

## 2023-01-28 DIAGNOSIS — M25559 Pain in unspecified hip: Secondary | ICD-10-CM

## 2023-01-28 DIAGNOSIS — I2510 Atherosclerotic heart disease of native coronary artery without angina pectoris: Secondary | ICD-10-CM

## 2023-01-28 MED ORDER — ROSUVASTATIN CALCIUM 10 MG PO TABS
10.0000 mg | ORAL_TABLET | Freq: Every day | ORAL | 3 refills | Status: DC
Start: 2023-01-28 — End: 2023-12-22

## 2023-02-06 MED ORDER — OXYCODONE HCL 5 MG PO TABS
ORAL_TABLET | ORAL | 0 refills | Status: DC
Start: 2023-02-06 — End: 2023-04-10

## 2023-02-06 MED ORDER — AMLODIPINE BESYLATE 10 MG PO TABS: 10.0000 mg | ORAL_TABLET | Freq: Every day | ORAL | 3 refills | Status: DC

## 2023-02-06 NOTE — Addendum Note (Signed)
Addended by: Abbe Amsterdam C on: 02/06/2023 01:01 PM   Modules accepted: Orders

## 2023-02-07 DIAGNOSIS — J441 Chronic obstructive pulmonary disease with (acute) exacerbation: Secondary | ICD-10-CM | POA: Diagnosis not present

## 2023-02-12 DIAGNOSIS — J449 Chronic obstructive pulmonary disease, unspecified: Secondary | ICD-10-CM | POA: Diagnosis not present

## 2023-02-14 ENCOUNTER — Telehealth: Payer: Self-pay | Admitting: Family Medicine

## 2023-02-14 ENCOUNTER — Encounter: Payer: Self-pay | Admitting: Family Medicine

## 2023-02-14 NOTE — Telephone Encounter (Signed)
Dr Patsy Lager: I know that you were wanting the pt to be seen anyway to address his need for a motor scooter. Do you want to address this then? Or now?

## 2023-02-14 NOTE — Addendum Note (Signed)
Addended by: Abbe Amsterdam C on: 02/14/2023 05:50 PM   Modules accepted: Orders

## 2023-02-14 NOTE — Telephone Encounter (Signed)
Pt called and stated that he needs to talk with PCP or nurse regarding switching oxygen companies. He stated that his treatments are needing to be transferred to [facility].   Additionally, pt mentioned that he needs his pain medication, bp med, and nebulizer filled today. He stated that it was originally set to be filled tomorrow, 11/16. Please call and advise.

## 2023-02-18 ENCOUNTER — Other Ambulatory Visit: Payer: Self-pay

## 2023-02-18 DIAGNOSIS — J441 Chronic obstructive pulmonary disease with (acute) exacerbation: Secondary | ICD-10-CM

## 2023-02-18 DIAGNOSIS — Z9981 Dependence on supplemental oxygen: Secondary | ICD-10-CM

## 2023-02-18 NOTE — Telephone Encounter (Signed)
Spoke with Morrie Sheldon at [facility]. He will has given me directions on how to order the DME correctly. However, he will send me a message on when to do so, he is making sure the pt has had a walk test and qualifies. If not, we will have to do the walk test at pts 11/25 OV, and then order.

## 2023-02-18 NOTE — Telephone Encounter (Signed)
Pt called back and stated that he was informed from [facility] that PCP sent orders to the wrong office. I am unsure which office the pt was referring to but he wanted me to ask for it to be sent to [facility]. Please call and advise.

## 2023-02-18 NOTE — Progress Notes (Incomplete)
Please sign order for DME.    Morrie Sheldon also stared they they would need the visit on the 25th showing that pt uses O2 and sending order to [facility].  Morrie Sheldon also mentioned as a side note on Mr Daisey, he  qualifies for a Trilogy style pap machine due to his overlapping COPD and Scoliosis,  BOTH preventing the pt from achieving normal breathing goals. He says he can help with this order if you are agreeable.

## 2023-02-19 NOTE — Telephone Encounter (Signed)
Got in touch with [facility]- local branch Leretha Dykes [address] [place], [place] [address] [phone] fax [phone]  Fax the order for oxygen to the local [facility] and put on the order that it is a conversion from a different DME company.  Please see the MyChart message reply(ies) for my assessment and plan.  The patient gave consent for this Medical Advice Message and is aware that it may result in a bill to their insurance company as well as the possibility that this may result in a co-payment or deductible. They are an established patient, but are not seeking medical advice exclusively about a problem treated during an in person or video visit in the last 7 days. I did not recommend an in person or video visit within 7 days of my reply.  I spent a total of 15 minutes cumulative time within 7 days through [employer] Abbe Amsterdam, MD

## 2023-02-20 IMAGING — CT CT L SPINE W/O CM
3 series · 13 of 33 positions shown, 16 images · non-contrast
Comparison: Lumbar spine MRI 08/04/2010

CLINICAL DATA: Back trauma, no prior imaging (Age >= 16y). Fall 3
days ago. Low back and right hip pain.

EXAM:
CT LUMBAR SPINE WITHOUT CONTRAST
TECHNIQUE: Multidetector CT imaging of the lumbar spine was performed without
intravenous contrast administration. Multiplanar CT image
reconstructions were also generated.

[Series 5: l spine soft · axial · 0.42mm/px · z∈[+584,+778]mm · 5 of 141 slices shown, 7 images]
[im 22/141  soft-tissue]
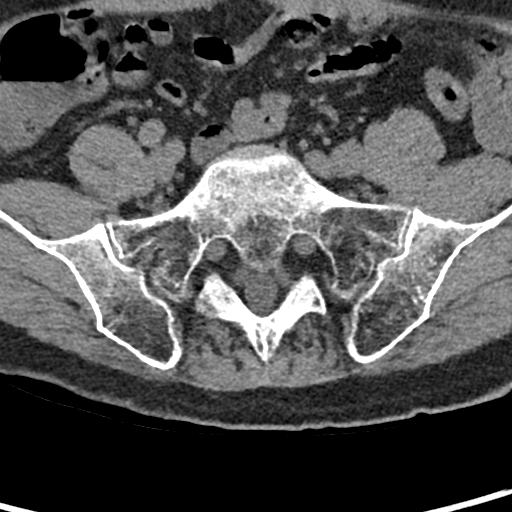
[im 22/141  bone]
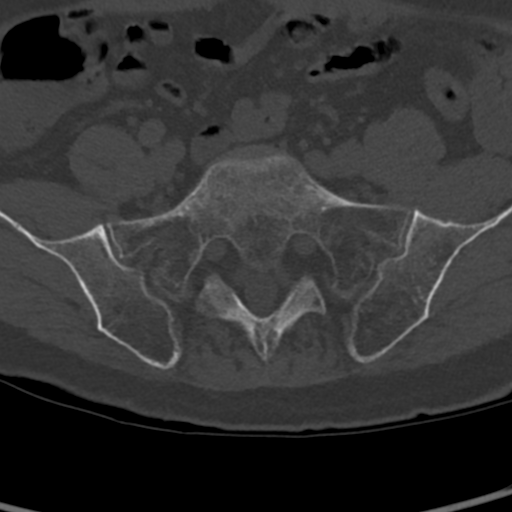
[im 44/141  bone]
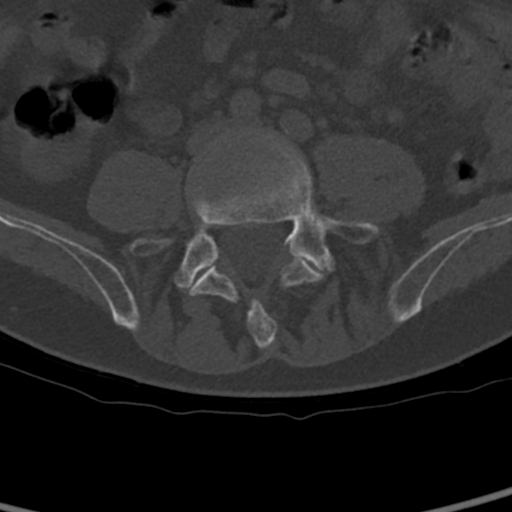
[im 76/141  bone]
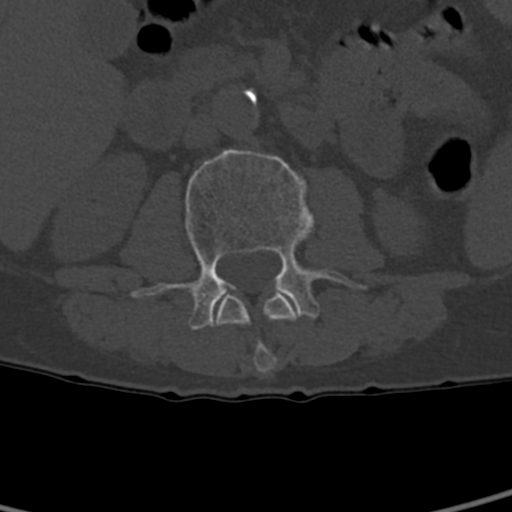
[im 97/141  bone]
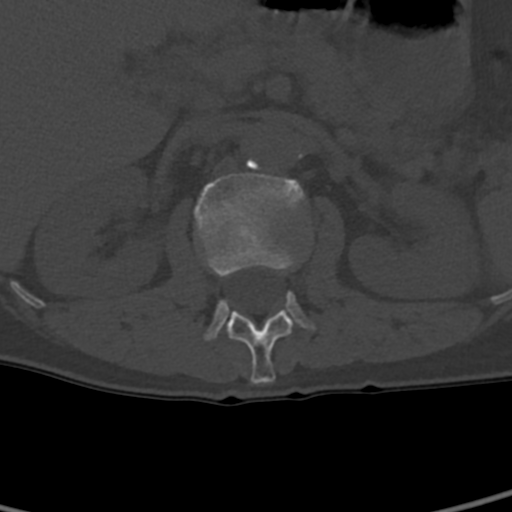
[im 119/141  soft-tissue]
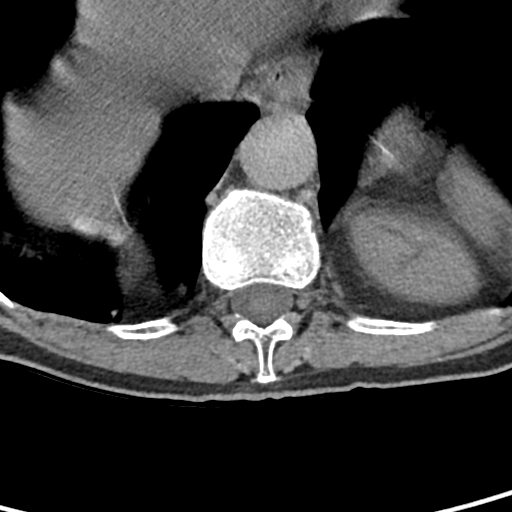
[im 119/141  bone]
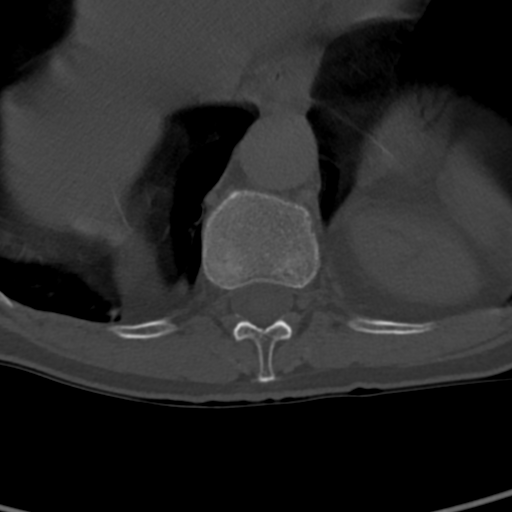

[Series 6: sagittal bone · sagittal · 0.41mm/px · 5 of 86 slices shown, 6 images]
[im 29/86  bone]
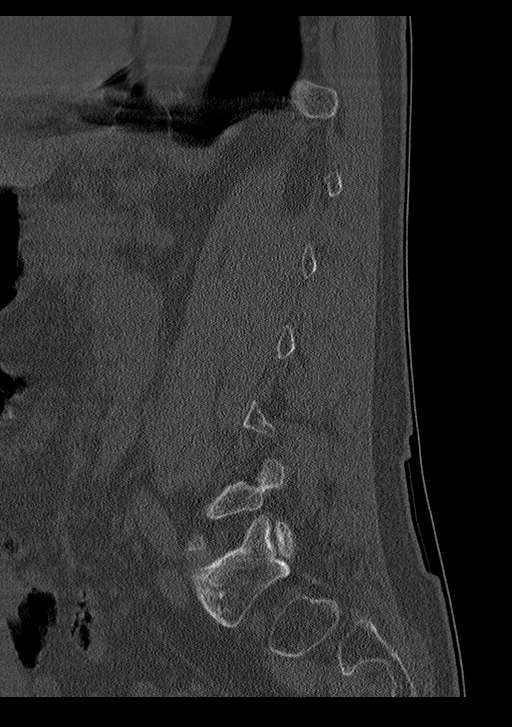
[im 36/86  bone]
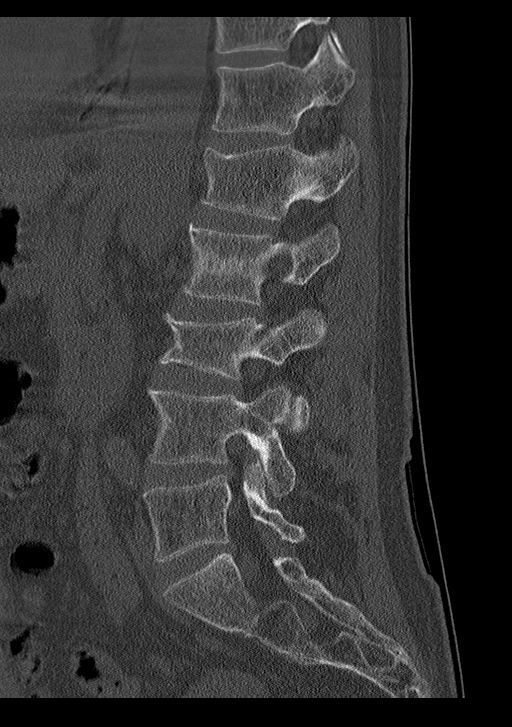
[im 43/86  soft-tissue]
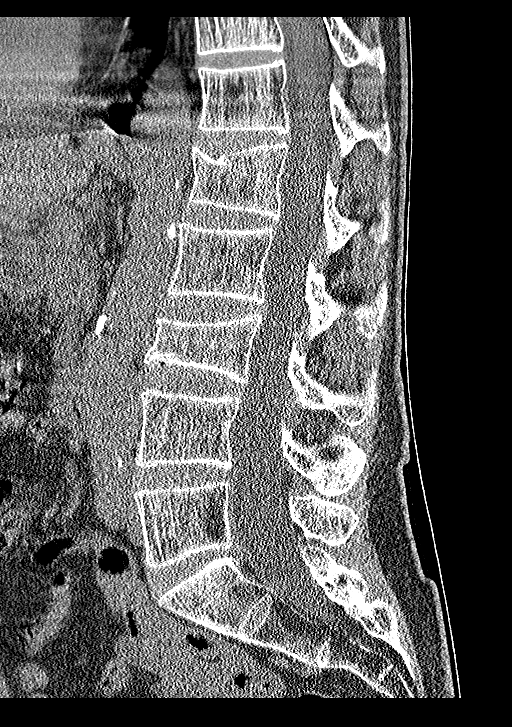
[im 43/86  bone]
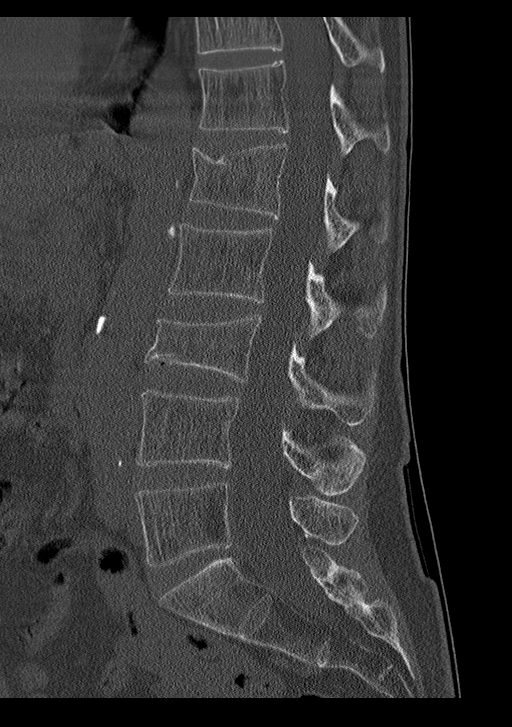
[im 50/86  bone]
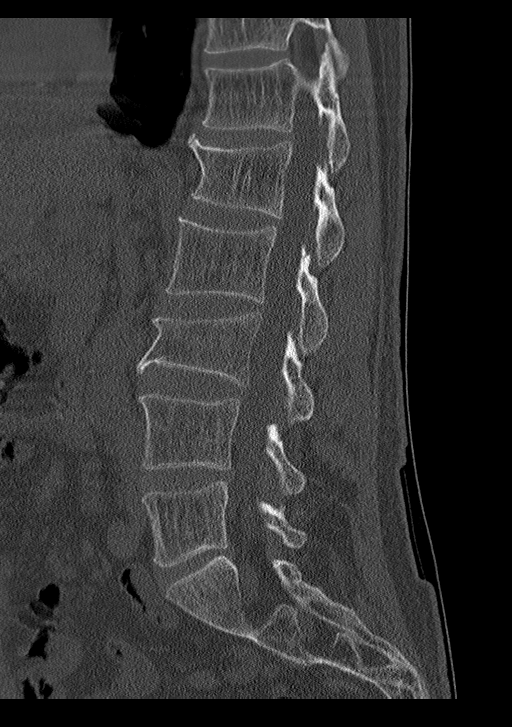
[im 57/86  bone]
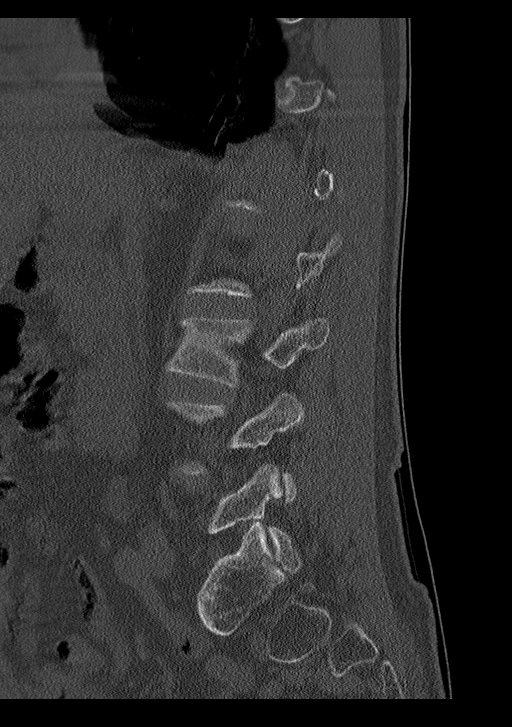

[Series 7: coronal bone · coronal · 0.41mm/px · 3 of 75 slices shown]
[im 15/75  bone]
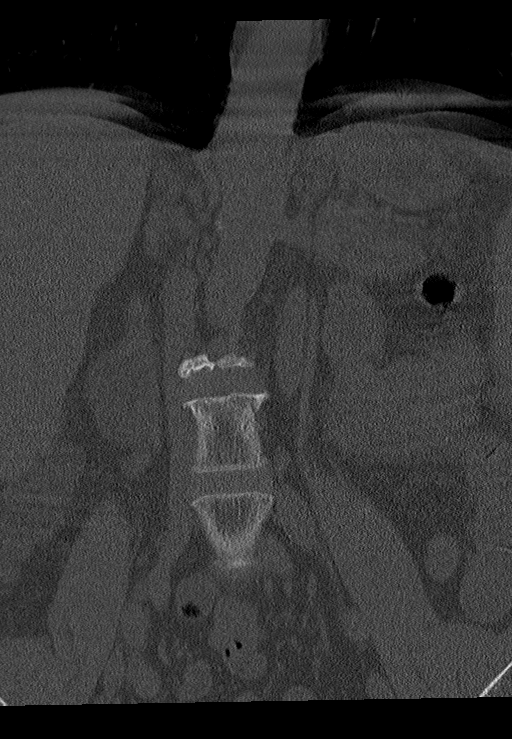
[im 30/75  bone]
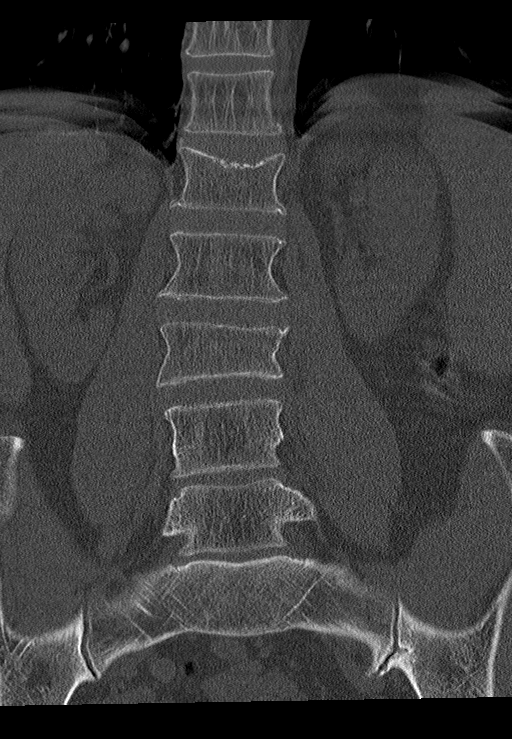
[im 45/75  bone]
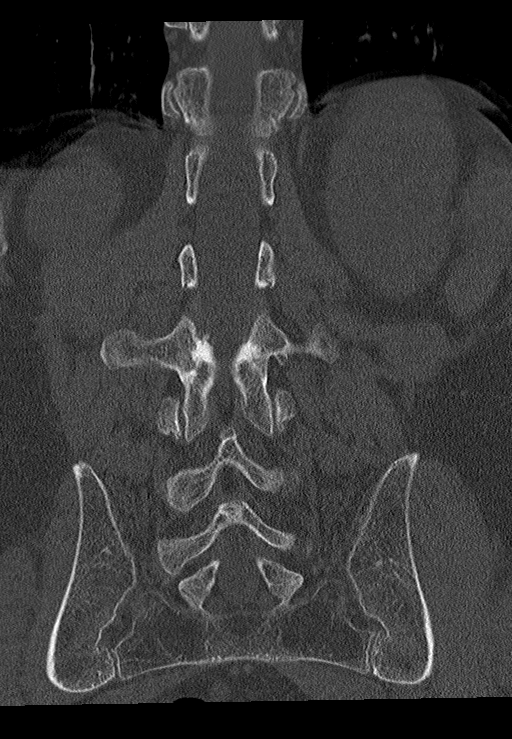

[13 of 33 positions shown; findings below may reference images not displayed]

FINDINGS: Segmentation: 5 lumbar type vertebrae.

Alignment: Slight right convex curvature of the lumbar spine. No
listhesis.

Vertebrae: Mild L1 and moderate L3 superior endplate compression
fractures, chronic and unchanged from the prior MRI. No acute
fracture or suspicious osseous lesion. Diffuse osteopenia.

Paraspinal and other soft tissues: Mild scarring or atelectasis in
the lung bases. Abdominal aortic atherosclerosis without aneurysm.

Disc levels: Mild chronic disc space narrowing and mild disc bulging
at L4-5 and L5-S1. Capacious lumbar spinal canal. No evidence of
significant spinal canal or neural foraminal stenosis.
IMPRESSION: 1. No acute osseous abnormality identified in the lumbar spine.
2. Chronic L1 and L3 compression fractures.
3. Aortic Atherosclerosis ([id number]).

## 2023-02-20 IMAGING — DX DG HIP (WITH OR WITHOUT PELVIS) 2-3V*R*
3 series · 3 of 3 positions shown · non-contrast
Comparison: 07/21/2010 radiographs

CLINICAL DATA: Acute RIGHT hip pain following fall 3 days ago.
Initial encounter.

EXAM:
DG HIP (WITH OR WITHOUT PELVIS) 2-3V RIGHT

[hip ap]
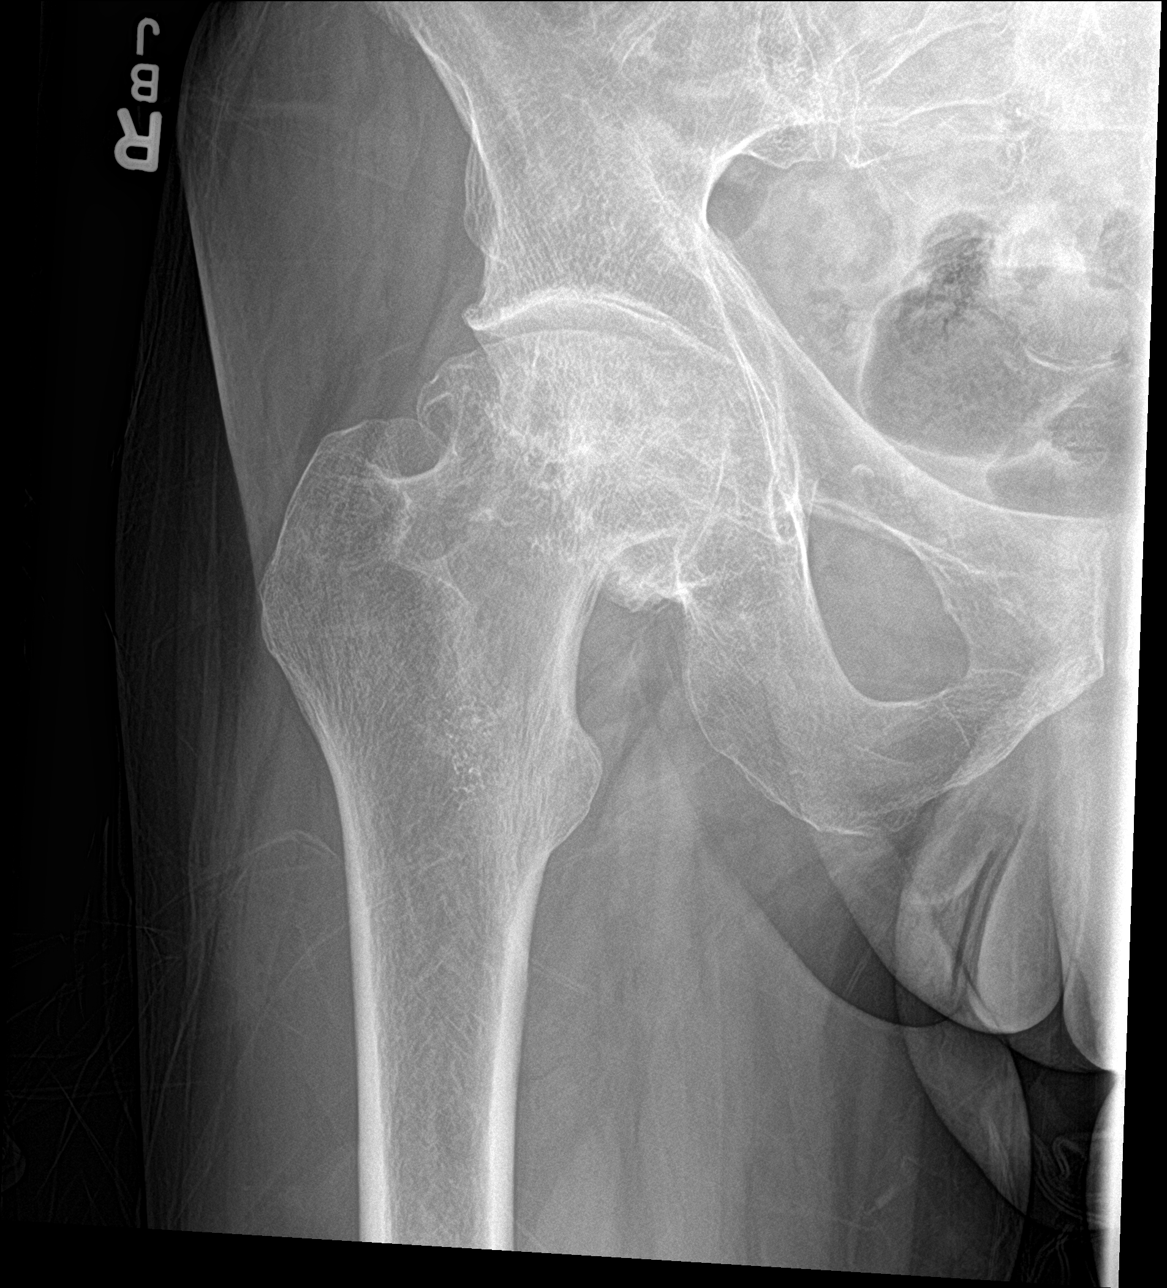

[hip frog leg]
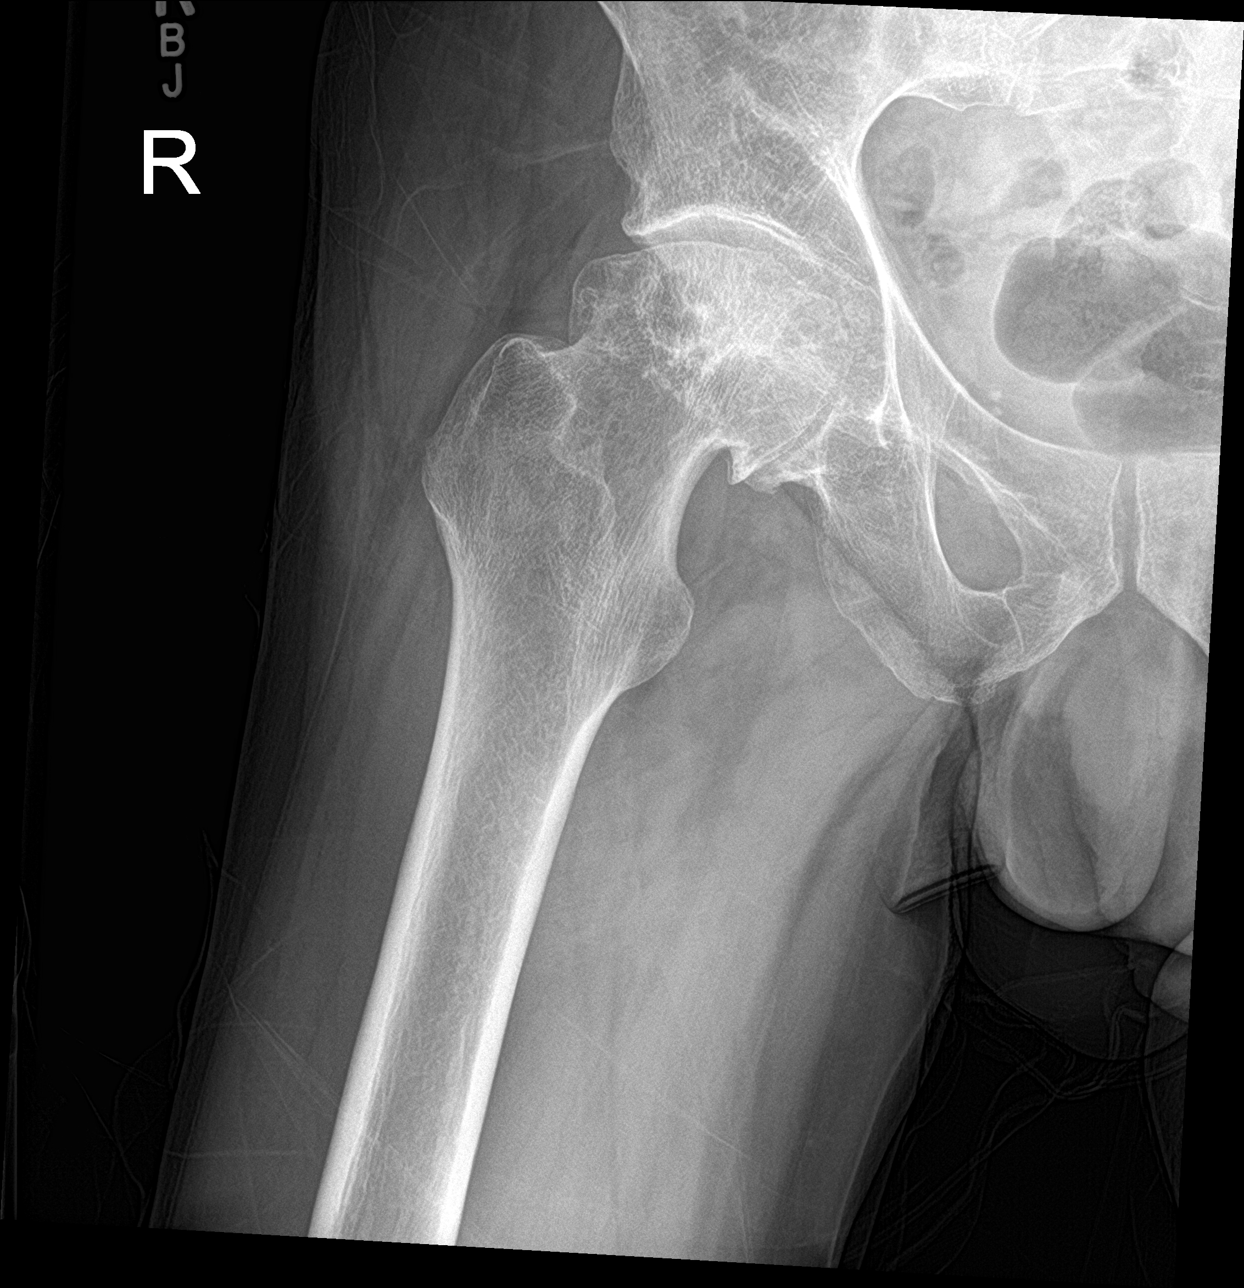

[pelvis ap]
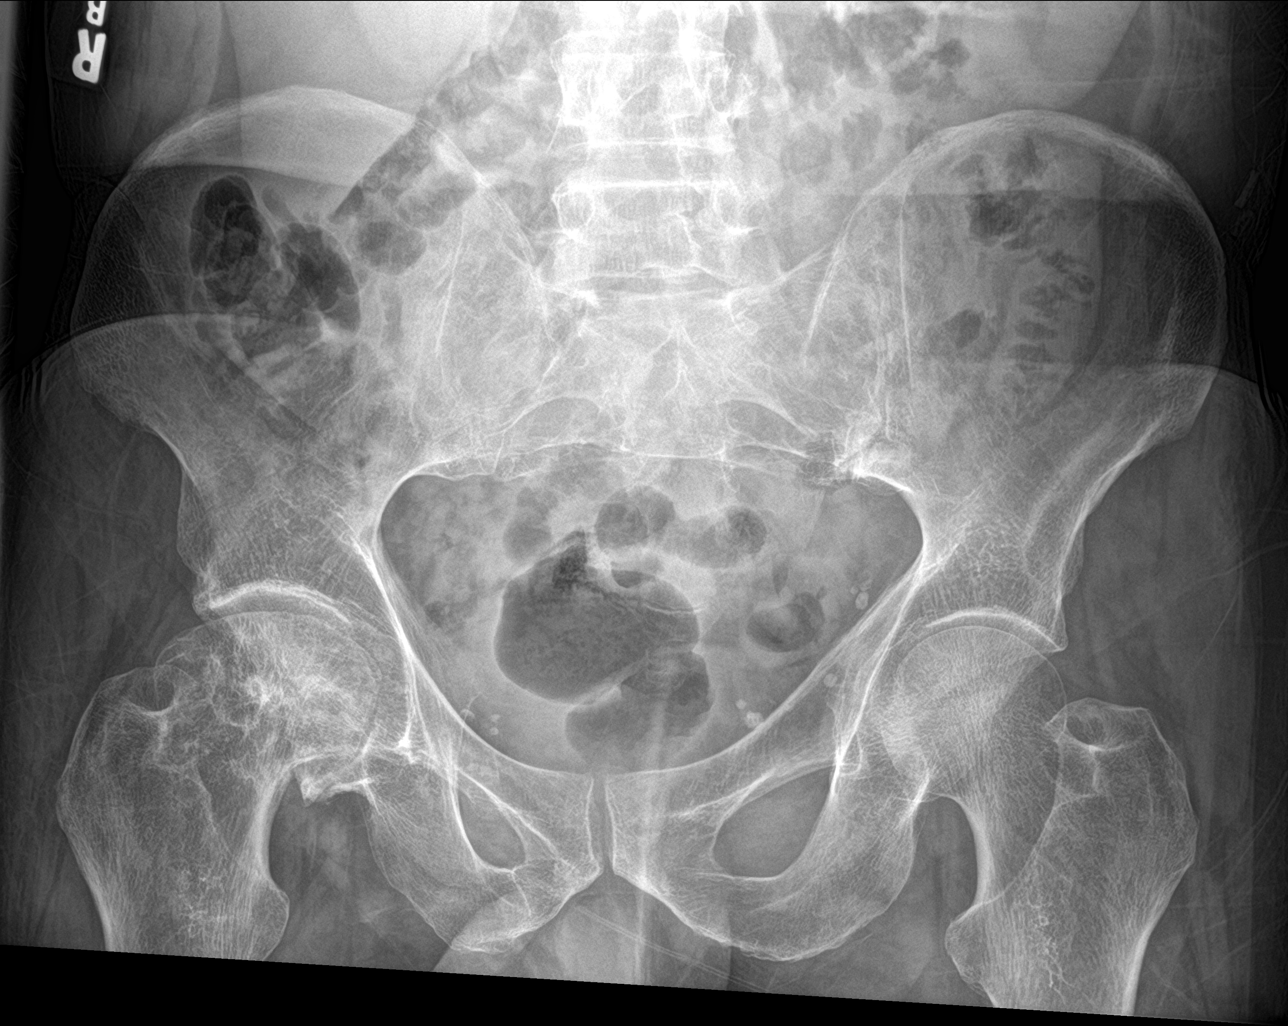

[3 of 3 positions shown; findings below may reference images not displayed]

FINDINGS: There is no evidence of acute fracture or dislocation.

Chronic flattening/deformity of the RIGHT femoral head with changes
of AVN again noted with moderate to severe degenerative changes in
the RIGHT hip.

There has been little interval change since the prior study.
IMPRESSION: 1. No evidence of acute abnormality.
2. Chronic flattening/deformity of the RIGHT femoral head with
changes of AVN and moderate to severe degenerative changes in the
RIGHT hip.

## 2023-02-20 MED ORDER — DOXYCYCLINE HYCLATE 100 MG PO CAPS
100.0000 mg | ORAL_CAPSULE | Freq: Two times a day (BID) | ORAL | 0 refills | Status: DC
Start: 2023-02-20 — End: 2023-04-14

## 2023-02-20 NOTE — Telephone Encounter (Signed)
Please see the message below, also Morrie Sheldon from [facility] sent me the following message:    I was wondering, before Dr. Gwynneth Albright the order you created, in EPIC;  if you could add the  2lpm to the  LPM ?   Then we could process this since we have good testing.

## 2023-02-20 NOTE — Addendum Note (Signed)
Addended by: Abbe Amsterdam C on: 02/20/2023 12:04 PM   Modules accepted: Orders

## 2023-02-20 NOTE — Progress Notes (Unsigned)
[facility] [address], [address] [place], [place] [address] [phone]  Date:  02/24/2023   Name:  Grant Galvan   DOB:  04-25-1960   MRN:  [id number]  PCP:  Pearline Cables, MD    Chief Complaint: No chief complaint on file.   History of Present Illness:  Grant Galvan is a 62 y.o. very pleasant male patient who presents with the following:  Patient seen today for follow-up.  Most recent visit with myself was in June   Also need to address his oxygen, he would like to transition to a different provider.  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  Reviewed hip films on chart 06/08/2022 IMPRESSION: 1. Severe deformity of the right femoral head with sclerosis and lucency. These findings could be due to avascular necrosis with partial collapse of the femoral head. Sequela of remote trauma is a possibility. There is resulting severe degenerative changes with significant loss of joint space centrally and inferiorly. 2. No other abnormalities .  Lumbar spine films 06/08/2022 Probable scoliotic curvature of the thoracolumbar spine, apex to the left. The degree of curvature is similar since the chest CT scout film from March 29, 2022. No other malalignment. Anterior wedging of L3 is identified. This finding is stable since the CT scan dated December 17, 2020. Anterior wedging L1 is also grossly stable since December 17, 2020. No acute  fractures. Degenerative disc disease with anterior osteophytes at L3-4. Lower lumbar facet degenerative changes suspected. No other abnormalities are identified.  IMPRESSION: 1. Scoliotic curvature of the thoracolumbar spine, apex to the left. 2. Anterior wedging of L1 and L3 is stable since December 17, 2020. 3. Degenerative changes as above.     Morrie Sheldon also stated they they would need the visit on the 25th showing that pt uses O2 and sending order to [facility].  Morrie Sheldon also mentioned as a side note on Mr Geno, he  qualifies for a Trilogy style pap machine due to his overlapping COPD and Scoliosis,  BOTH preventing the pt from achieving normal breathing goals. He says he can help with this order if you are agreeable.    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    No past surgical history on file.  Social History   Tobacco Use   Smoking status: Every  Day    Current packs/day: 0.50    Types: Cigarettes   Smokeless tobacco: Former  Building services engineer status: Never Used  Substance Use Topics   Alcohol use: No    Comment: gave up alcohol 1995.  hx heavy   Drug use: Not Currently    Family History  Problem Relation Age of Onset   Cancer Mother    Stroke Mother    COPD Father    Cancer Father 78       prostate   Cancer Brother 32       colon     Allergies  Allergen Reactions   Celecoxib Other (See Comments)    Stopped breathing   Cyclobenzaprine Hcl Hives   Tramadol Diarrhea    Medication list has been reviewed and updated.  Current Outpatient Medications on File Prior to Visit  Medication Sig Dispense Refill   albuterol (VENTOLIN HFA) 108 (90 Base) MCG/ACT inhaler Inhale 2 puffs into the lungs every 6 (six) hours as needed for wheezing or shortness of breath. 18 g 5   ALPRAZolam (XANAX) 1 MG tablet Take 1/2 or 1 by mouth for anxiety 30 minutes prior to blood draw 5 tablet 0   amLODipine (NORVASC) 10 MG tablet Take 1 tablet (10 mg total) by mouth daily. 90 tablet 3   clotrimazole-betamethasone (LOTRISONE) cream Apply 1 Application topically 2 (two) times daily. 45 g 0   doxycycline (VIBRA-TABS) 100 MG tablet Take 1 tablet (100 mg total) by mouth 2 (two) times daily. 20 tablet 0   doxycycline (VIBRAMYCIN) 100 MG capsule Take 1 capsule (100 mg total) by mouth 2 (two) times daily. 20 capsule 0   DULoxetine (CYMBALTA) 60 MG capsule Take 1 capsule (60 mg total) by mouth daily. 30 capsule 3   fluticasone-salmeterol (ADVAIR) 100-50 MCG/ACT AEPB Inhale 1 puff into the lungs 2 (two) times daily. 180 each 3   gabapentin (NEURONTIN) 300 MG capsule Take 1 capsule (300 mg total) by mouth 3 (three) times daily. 90 capsule 3   hydrochlorothiazide (HYDRODIURIL) 25 MG tablet Take 1 tablet (25 mg total) by mouth daily. Use as needed for swelling 30 tablet 3   ibuprofen (ADVIL) 800 MG tablet Take 1 tablet (800 mg total) by mouth 3 (three) times daily. 21 tablet 0   ipratropium-albuterol (DUONEB) 0.5-2.5 (3) MG/3ML SOLN Take 3 mLs by nebulization every 6 (six) hours as needed. 360 mL 3   methocarbamol 1000 MG TABS Take 1,000 mg by mouth every 8 (eight) hours as needed for muscle spasms. 21 tablet 0   oxyCODONE (OXY IR/ROXICODONE) 5 MG immediate release tablet TAKE ONE-HALF TO ONE TABLET (2.5 TO 5MG  TOTAL) BY MOUTH EVERY 6 HOURS AS NEEDED FOR  SEVERE PAIN 120 tablet 0   oxyCODONE (ROXICODONE) 5 MG immediate release tablet Take 0.5-1 tablets (2.5-5 mg total) by mouth every 6 (six) hours as needed for severe pain. 120 tablet 0   promethazine-dextromethorphan (PROMETHAZINE-DM) 6.25-15 MG/5ML syrup Take 2.5 mLs by mouth 4 (four) times daily as needed for cough. 118 mL 0   Respiratory Therapy Supplies (NEBULIZER/TUBING/MOUTHPIECE) KIT 1 each by Does not apply route as directed. 1 kit 0   rosuvastatin (CRESTOR) 10 MG tablet Take 1 tablet (10 mg total) by mouth daily. 90 tablet 3   triamcinolone cream (KENALOG) 0.5 % Apply topically.     umeclidinium bromide (INCRUSE ELLIPTA) 62.5 MCG/ACT AEPB Inhale 1 puff into the lungs daily. 90 each 3   No current facility-administered medications on  file prior to visit.    Review of Systems:  As per HPI- otherwise negative.   Physical Examination: There were no vitals filed for this visit. There were no vitals filed for this visit. There is no height or weight on file to calculate BMI. Ideal Body Weight:    GEN: no acute distress. HEENT: Atraumatic, Normocephalic.  Ears and Nose: No external deformity. CV: RRR, No M/G/R. No JVD. No thrill. No extra heart sounds. PULM: CTA B, no wheezes, crackles, rhonchi. No retractions. No resp. distress. No accessory muscle use. ABD: S, NT, ND, +BS. No rebound. No HSM. EXTR: No c/c/e PSYCH: Normally interactive. Conversant.    Assessment and Plan: ***  Signed Abbe Amsterdam, MD

## 2023-02-24 ENCOUNTER — Ambulatory Visit (INDEPENDENT_AMBULATORY_CARE_PROVIDER_SITE_OTHER): Payer: HMO | Admitting: Family Medicine

## 2023-02-24 ENCOUNTER — Encounter: Payer: Self-pay | Admitting: Family Medicine

## 2023-02-24 VITALS — BP 140/80 | HR 88 | Temp 97.7°F | Resp 22 | Ht 74.0 in | Wt 176.6 lb

## 2023-02-24 DIAGNOSIS — I2510 Atherosclerotic heart disease of native coronary artery without angina pectoris: Secondary | ICD-10-CM | POA: Diagnosis not present

## 2023-02-24 DIAGNOSIS — R269 Unspecified abnormalities of gait and mobility: Secondary | ICD-10-CM

## 2023-02-24 DIAGNOSIS — G894 Chronic pain syndrome: Secondary | ICD-10-CM | POA: Diagnosis not present

## 2023-02-24 DIAGNOSIS — I2583 Coronary atherosclerosis due to lipid rich plaque: Secondary | ICD-10-CM

## 2023-02-24 DIAGNOSIS — J441 Chronic obstructive pulmonary disease with (acute) exacerbation: Secondary | ICD-10-CM | POA: Diagnosis not present

## 2023-02-24 DIAGNOSIS — Z9981 Dependence on supplemental oxygen: Secondary | ICD-10-CM

## 2023-02-24 NOTE — Telephone Encounter (Signed)
Pt called and stated that he forgot to tell PCP during his appt to send over his Nebulizer to [facility].

## 2023-02-24 NOTE — Patient Instructions (Signed)
Stop by lab on the way out for your urine testing- check back here in 6 months  Ordered chest film for Grant Galvan for you to have done tomorrow I will get in touch with [facility] for you!

## 2023-02-25 ENCOUNTER — Encounter: Payer: Self-pay | Admitting: Family Medicine

## 2023-02-25 DIAGNOSIS — M25559 Pain in unspecified hip: Secondary | ICD-10-CM

## 2023-02-25 NOTE — Telephone Encounter (Signed)
[facility] called stating that they were missing the following info on pt's oxygen order:  -Sig of PCP -Name of PCP -NPI of PCP

## 2023-02-26 LAB — DRUG MONITORING, PANEL 8 WITH CONFIRMATION, URINE
6 Acetylmorphine: NEGATIVE ng/mL (ref <10)
Alcohol Metabolites: NEGATIVE ng/mL (ref <500)
Amphetamines: NEGATIVE ng/mL (ref <500)
Benzodiazepines: NEGATIVE ng/mL (ref <100)
Buprenorphine, Urine: NEGATIVE ng/mL (ref <5)
Cocaine Metabolite: NEGATIVE ng/mL (ref <150)
Codeine: NEGATIVE ng/mL (ref <50)
Creatinine: 135.9 mg/dL (ref >=20.0)
Hydrocodone: NEGATIVE ng/mL (ref <50)
Hydromorphone: NEGATIVE ng/mL (ref <50)
MDMA: NEGATIVE ng/mL (ref <500)
Marijuana Metabolite: NEGATIVE ng/mL (ref <20)
Morphine: NEGATIVE ng/mL (ref <50)
Norhydrocodone: NEGATIVE ng/mL (ref <50)
Noroxycodone: >10000 ng/mL — ABNORMAL HIGH (ref <50)
Opiates: NEGATIVE ng/mL (ref <100)
Oxidant: NEGATIVE ug/mL (ref <200)
Oxycodone: >10000 ng/mL — ABNORMAL HIGH (ref <50)
Oxycodone: POSITIVE ng/mL — AB (ref <100)
Oxymorphone: 9014 ng/mL — ABNORMAL HIGH (ref <50)
pH: 6.7 (ref 4.5–9.0)

## 2023-02-26 LAB — DM TEMPLATE

## 2023-02-26 NOTE — Telephone Encounter (Signed)
Forms have been faxed

## 2023-02-26 NOTE — Telephone Encounter (Signed)
[facility] called stating that the order that was submitted with the three items will not work and needs to talk with a CMA. Advised a message would be sent back to give her a call back.

## 2023-02-26 NOTE — Telephone Encounter (Signed)
Tried calling back, wait time was 12 mins.   Im a bit confused on what the order needs to consist of. Morrie Sheldon says this can be pulled from Epic, then we get a message stating Physician name, sig and NPI need to be included- this is then faxed and then the 3 items are not accepted?   Will try again later.

## 2023-02-26 NOTE — Telephone Encounter (Signed)
Order faxed again with Signature, name, and NPI included.

## 2023-03-03 DIAGNOSIS — J449 Chronic obstructive pulmonary disease, unspecified: Secondary | ICD-10-CM | POA: Diagnosis not present

## 2023-03-10 MED ORDER — OXYCODONE HCL 5 MG PO TABS
2.5000 mg | ORAL_TABLET | Freq: Four times a day (QID) | ORAL | 0 refills | Status: DC | PRN
Start: 2023-03-10 — End: 2023-05-12

## 2023-03-10 NOTE — Addendum Note (Signed)
Addended by: Abbe Amsterdam C on: 03/10/2023 04:58 PM   Modules accepted: Orders

## 2023-03-18 DIAGNOSIS — J449 Chronic obstructive pulmonary disease, unspecified: Secondary | ICD-10-CM | POA: Diagnosis not present

## 2023-03-21 DIAGNOSIS — M4135 Thoracogenic scoliosis, thoracolumbar region: Secondary | ICD-10-CM | POA: Diagnosis not present

## 2023-03-29 ENCOUNTER — Encounter: Payer: Self-pay | Admitting: Family Medicine

## 2023-03-29 DIAGNOSIS — M25559 Pain in unspecified hip: Secondary | ICD-10-CM

## 2023-03-29 DIAGNOSIS — I10 Essential (primary) hypertension: Secondary | ICD-10-CM

## 2023-03-30 ENCOUNTER — Other Ambulatory Visit: Payer: Self-pay

## 2023-03-30 ENCOUNTER — Encounter (HOSPITAL_COMMUNITY): Payer: Self-pay | Admitting: Emergency Medicine

## 2023-03-30 ENCOUNTER — Emergency Department (HOSPITAL_COMMUNITY)
Admission: EM | Admit: 2023-03-30 | Discharge: 2023-03-30 | Disposition: A | Discharge status: Home / Self Care | Payer: HMO | Attending: Emergency Medicine | Admitting: Emergency Medicine

## 2023-03-30 ENCOUNTER — Emergency Department (HOSPITAL_COMMUNITY): Payer: HMO

## 2023-03-30 DIAGNOSIS — R0602 Shortness of breath: Secondary | ICD-10-CM | POA: Diagnosis not present

## 2023-03-30 DIAGNOSIS — Z7951 Long term (current) use of inhaled steroids: Secondary | ICD-10-CM | POA: Diagnosis not present

## 2023-03-30 DIAGNOSIS — Z1152 Encounter for screening for COVID-19: Secondary | ICD-10-CM | POA: Diagnosis not present

## 2023-03-30 DIAGNOSIS — J441 Chronic obstructive pulmonary disease with (acute) exacerbation: Secondary | ICD-10-CM | POA: Diagnosis not present

## 2023-03-30 DIAGNOSIS — J449 Chronic obstructive pulmonary disease, unspecified: Secondary | ICD-10-CM | POA: Diagnosis not present

## 2023-03-30 DIAGNOSIS — R0789 Other chest pain: Secondary | ICD-10-CM | POA: Diagnosis not present

## 2023-03-30 DIAGNOSIS — R071 Chest pain on breathing: Secondary | ICD-10-CM

## 2023-03-30 DIAGNOSIS — R059 Cough, unspecified: Secondary | ICD-10-CM | POA: Diagnosis not present

## 2023-03-30 LAB — RESP PANEL BY RT-PCR (RSV, FLU A&B, COVID)  RVPGX2
Influenza A by PCR: NEGATIVE
Influenza B by PCR: NEGATIVE
Resp Syncytial Virus by PCR: NEGATIVE
SARS Coronavirus 2 by RT PCR: NEGATIVE

## 2023-03-30 MED ORDER — LIDOCAINE 5 % EX PTCH
1.0000 | MEDICATED_PATCH | CUTANEOUS | Status: DC
  Administered 2023-03-30: 1 via TRANSDERMAL
  Filled 2023-03-30: qty 1

## 2023-03-30 MED ORDER — PREDNISONE 50 MG PO TABS
60.0000 mg | ORAL_TABLET | Freq: Once | ORAL | Status: AC
  Administered 2023-03-30: 60 mg via ORAL
  Filled 2023-03-30: qty 1

## 2023-03-30 MED ORDER — AZITHROMYCIN 250 MG PO TABS: 250.0000 mg | ORAL_TABLET | Freq: Every day | ORAL | 0 refills | Status: DC

## 2023-03-30 MED ORDER — HYDROCODONE-ACETAMINOPHEN 5-325 MG PO TABS
1.0000 | ORAL_TABLET | Freq: Once | ORAL | Status: AC
  Administered 2023-03-30: 1 via ORAL
  Filled 2023-03-30: qty 1

## 2023-03-30 MED ORDER — ALBUTEROL SULFATE HFA 108 (90 BASE) MCG/ACT IN AERS
2.0000 | INHALATION_SPRAY | RESPIRATORY_TRACT | Status: DC | PRN
  Administered 2023-03-30: 2 via RESPIRATORY_TRACT
  Filled 2023-03-30 (×2): qty 6.7

## 2023-03-30 MED ORDER — AZITHROMYCIN 250 MG PO TABS
500.0000 mg | ORAL_TABLET | ORAL | Status: AC
  Administered 2023-03-30: 500 mg via ORAL
  Filled 2023-03-30: qty 2

## 2023-03-30 MED ORDER — IPRATROPIUM-ALBUTEROL 0.5-2.5 (3) MG/3ML IN SOLN
3.0000 mL | RESPIRATORY_TRACT | Status: AC
  Administered 2023-03-30 (×2): 3 mL via RESPIRATORY_TRACT
  Filled 2023-03-30: qty 6

## 2023-03-30 MED ORDER — METHOCARBAMOL 1000 MG PO TABS: 1000.0000 mg | ORAL_TABLET | Freq: Three times a day (TID) | ORAL | 0 refills | Status: DC | PRN

## 2023-03-30 MED ORDER — PREDNISONE 20 MG PO TABS: 40.0000 mg | ORAL_TABLET | Freq: Every day | ORAL | 0 refills | Status: AC

## 2023-03-30 NOTE — ED Notes (Signed)
ED Provider at bedside. 

## 2023-03-30 NOTE — ED Notes (Signed)
This RN attempted to get a line and labs on pt, pt refused saying "you are not touching me with a needle, I will knock you the fuck out". RN explained the importance of lab work d/t CC. Pt stated "You will not touch me with a needle I don't care what they say"   MD aware.

## 2023-03-30 NOTE — ED Provider Notes (Signed)
[place] [facility]MENT AT [facility] Provider Note   CSN: [id number] Arrival date & time: 03/30/23  2025     History  Chief Complaint  Patient presents with   Shortness of Breath    Grant Galvan is a 62 y.o. male.  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  Patient reports that since Tuesday has been coughing with yellow sputum.  No fevers.  Says that he started developing severe pain in his left chest that is pleuritic and sharp.  No leg swelling.  No history of cancer or DVT or PE.  Not on blood thinners.  Has a severe history of needle phobia and is refusing any IV blood work at this time.       Home Medications Prior to Admission medications   Medication Sig Start Date End Date Taking? Authorizing Provider  azithromycin (ZITHROMAX) 250 MG tablet Take 1 tablet (250 mg total) by mouth daily. Take first 2 tablets together, then 1 every day until finished. 03/30/23  Yes Rondel Baton, MD  predniSONE (DELTASONE) 20 MG tablet Take 2 tablets (40 mg total) by mouth daily for 4 days. 03/30/23 04/03/23 Yes Rondel Baton, MD  albuterol (VENTOLIN HFA) 108 (90 Base) MCG/ACT inhaler Inhale 2 puffs into the lungs every 6 (six) hours as needed for wheezing or shortness of breath. 11/12/22   Copland, Gwenlyn Found, MD  ALPRAZolam Prudy Feeler) 1 MG tablet Take 1/2 or 1 by mouth for anxiety 30 minutes prior to blood draw 01/16/22   Copland, Gwenlyn Found, MD  amLODipine (NORVASC) 10 MG tablet Take 1 tablet (10 mg total) by mouth daily. 02/06/23   Copland, Gwenlyn Found, MD  clotrimazole-betamethasone (LOTRISONE) cream Apply 1 Application topically 2 (two) times daily. 11/29/21   Jacquelin Hawking, PA-C  doxycycline (VIBRA-TABS) 100 MG tablet Take 1 tablet (100 mg total) by mouth 2 (two) times daily. 11/15/22   Copland, Gwenlyn Found, MD  doxycycline (VIBRAMYCIN) 100 MG  capsule Take 1 capsule (100 mg total) by mouth 2 (two) times daily. 01/08/23   Copland, Gwenlyn Found, MD  doxycycline (VIBRAMYCIN) 100 MG capsule Take 1 capsule (100 mg total) by mouth 2 (two) times daily. 02/20/23   Copland, Gwenlyn Found, MD  DULoxetine (CYMBALTA) 60 MG capsule Take 1 capsule (60 mg total) by mouth daily. 09/16/22   Copland, Gwenlyn Found, MD  fluticasone-salmeterol (ADVAIR) 100-50 MCG/ACT AEPB Inhale 1 puff into the lungs 2 (two) times daily. 07/16/22   Copland, Gwenlyn Found, MD  gabapentin (NEURONTIN) 300 MG capsule Take 1 capsule (300 mg total) by mouth 3 (three) times daily. 01/22/22   Copland, Gwenlyn Found, MD  hydrochlorothiazide (HYDRODIURIL) 25 MG tablet Take 1 tablet (25 mg total) by mouth daily. Use as needed for swelling 09/04/22   Copland, Gwenlyn Found, MD  ibuprofen (ADVIL) 800 MG tablet Take 1 tablet (800 mg total) by mouth 3 (three) times daily. 03/28/22   Pricilla Loveless, MD  ipratropium-albuterol (DUONEB) 0.5-2.5 (3) MG/3ML SOLN Take 3 mLs by nebulization every 6 (six) hours as needed. 11/12/22   Copland, Gwenlyn Found, MD  Methocarbamol 1000 MG TABS Take 1,000 mg by mouth every 8 (eight) hours as needed. 03/30/23   Rondel Baton, MD  oxyCODONE (OXY IR/ROXICODONE) 5 MG immediate release tablet TAKE ONE-HALF TO ONE TABLET (2.5 TO 5MG  TOTAL) BY MOUTH EVERY 6 HOURS AS NEEDED FOR SEVERE PAIN 02/06/23  Copland, Gwenlyn Found, MD  oxyCODONE (ROXICODONE) 5 MG immediate release tablet Take 0.5-1 tablets (2.5-5 mg total) by mouth every 6 (six) hours as needed for severe pain (pain score 7-10). 03/10/23   Copland, Gwenlyn Found, MD  promethazine-dextromethorphan (PROMETHAZINE-DM) 6.25-15 MG/5ML syrup Take 2.5 mLs by mouth 4 (four) times daily as needed for cough. 09/04/22   Copland, Gwenlyn Found, MD  Respiratory Therapy Supplies (NEBULIZER/TUBING/MOUTHPIECE) KIT 1 each by Does not apply route as directed. 08/28/22   Copland, Gwenlyn Found, MD  rosuvastatin (CRESTOR) 10 MG tablet Take 1 tablet (10 mg total) by mouth  daily. 01/28/23   Copland, Gwenlyn Found, MD  triamcinolone cream (KENALOG) 0.5 % Apply topically.    [provider]  umeclidinium bromide (INCRUSE ELLIPTA) 62.5 MCG/ACT AEPB Inhale 1 puff into the lungs daily. 07/16/22   Copland, Gwenlyn Found, MD      Allergies    Celecoxib, Cyclobenzaprine hcl, and Tramadol    Review of Systems   Review of Systems  Physical Exam Updated Vital Signs BP (!) 148/93 (BP Location: Right Arm)   Pulse 87   Temp 97.9 F (36.6 C) (Oral)   Resp 17   Ht 6\' 2"  (1.88 m)   Wt 77.6 kg   SpO2 97%   BMI 21.96 kg/m  Physical Exam Vitals and nursing note reviewed.  Constitutional:      General: He is not in acute distress.    Appearance: He is well-developed.     Comments: 2 L nasal cannula  HENT:     Head: Normocephalic and atraumatic.     Right Ear: External ear normal.     Left Ear: External ear normal.     Nose: Nose normal.  Eyes:     Extraocular Movements: Extraocular movements intact.     Conjunctiva/sclera: Conjunctivae normal.     Pupils: Pupils are equal, round, and reactive to light.  Cardiovascular:     Rate and Rhythm: Normal rate and regular rhythm.     Heart sounds: Normal heart sounds.     Comments: Reproducible chest pain Pulmonary:     Effort: Pulmonary effort is normal. No respiratory distress.     Breath sounds: Wheezing (Faint expiratory) present.  Musculoskeletal:     Cervical back: Normal range of motion and neck supple.     Right lower leg: No edema.     Left lower leg: No edema.  Skin:    General: Skin is warm and dry.  Neurological:     Mental Status: He is alert. Mental status is at baseline.  Psychiatric:        Mood and Affect: Mood normal.        Behavior: Behavior normal.     ED Results / Procedures / Treatments   Labs (all labs ordered are listed, but only abnormal results are displayed) Labs Reviewed  RESP PANEL BY RT-PCR (RSV, FLU A&B, COVID)  [id number]    EKG EKG Interpretation Date/Time:  Sunday  March 30 2023 20:34:48 EST Ventricular Rate:  92 PR Interval:  141 QRS Duration:  110 QT Interval:  380 QTC Calculation: 471 R Axis:   74  Text Interpretation: Sinus rhythm Consider right atrial enlargement Consider right ventricular hypertrophy Nonspecific T abnrm, anterolateral leads Confirmed by Vonita Moss [phone]) on 03/30/2023 10:33:57 PM  Radiology DG Chest 2 View Result Date: 03/30/2023 CLINICAL DATA:  Shortness of breath, worsening cough. EXAM: CHEST - 2 VIEW COMPARISON:  Radiograph 09/04/2022, CT 01/03/2023 FINDINGS: Chronic hyperinflation. Advanced emphysema. Bandlike  biapical scarring. No evidence of acute airspace disease. The heart is normal in size. Mediastinal contours are stable. Prominence of the hila is vascular. No pneumothorax or large pleural effusion. No pulmonary edema. IMPRESSION: Chronic hyperinflation and advanced emphysema. No acute findings. Electronically Signed   By: Narda Rutherford M.D.   On: 03/30/2023 21:35    Procedures Procedures    Medications Ordered in ED Medications  ipratropium-albuterol (DUONEB) 0.5-2.5 (3) MG/3ML nebulizer solution 3 mL (3 mLs Nebulization Given 03/30/23 2202)  predniSONE (DELTASONE) tablet 60 mg (60 mg Oral Given 03/30/23 2102)  azithromycin (ZITHROMAX) tablet 500 mg (500 mg Oral Given 03/30/23 2102)  HYDROcodone-acetaminophen (NORCO/VICODIN) 5-325 MG per tablet 1 tablet (1 tablet Oral Given 03/30/23 2103)    ED Course/ Medical Decision Making/ A&P                                 Medical Decision Making Amount and/or Complexity of Data Reviewed Radiology: ordered.  Risk Prescription drug management.   CHOYCE GROOT is a 62 y.o. male with comorbidities that complicate the patient evaluation including COPD on 2 L nasal cannula, degenerative disease, and osteonecrosis who presents emergency department with cough and chest pain.   Initial Ddx:  MI, PE, pneumonia, dissection, pericarditis, costochondritis,  reflux  MDM:  Patient presents to the emergency department with chest pain and a cough.  On exam his chest pain appears to be reproducible.  Does have some faint expiratory wheezing.  With the patient's chest discomfort will obtain EKG to evaluate for MI.  Unfortunately I am very limited in the workup I can do for the patient since he is refusing all IV blood draws.  Did express to him that both the heart attack, dissection, and pulmonary embolism are on the differential and both of these can be fatal but requiring IV to diagnose.  He stated that he understood but did not want IV at this time.  Does have capacity to make this decision.  Considered dissection but with their symmetric pulses, history, and description of the pain feel it is less likely.  If chest x-ray reveals widened mediastinum or any other concerning findings will consider CTA.  With his reproducible chest pain could be due to costochondritis.  This cough could also be pleurisy, pneumonia or URI  Plan:  EKG COVID and flu Chest x-ray DuoNeb Prednisone Lidocaine patch Norco Azithromycin  ED Summary/Re-evaluation:  COVID and flu did not show signs of infection.  Chest x-ray without pneumonia or broken ribs or other acute abnormality.  Patient was feeling somewhat better after the Norco.  I did talk to him about the pain plan it appears that he has a chronic prescription for oxycodone.  I recommended that he follow-up with his outpatient prescriber regarding this if he needs more.  Will give him prednisone and azithromycin to take at home.  Patient still refusing lab work so signed out AGAINST MEDICAL ADVICE which she does have capacity to do.  I did again stressed that he could have a life-threatening cause of his pain and stated that he understood but was wishing to go home at this time.  Again the patient does have capacity at this time to make this decision.  Will have him follow-up with his primary doctor in several days as well.   Suspect that his symptoms are from COPD exacerbation and a URI.  This patient presents to the ED for concern  of complaints listed in HPI, this involves an extensive number of treatment options, and is a complaint that carries with it a high risk of complications and morbidity. Disposition including potential need for admission considered.   Dispo: DC Home. Return precautions discussed including, but not limited to, those listed in the AVS. Allowed pt time to ask questions which were answered fully prior to dc.  Records reviewed Outpatient Clinic Notes   I independently reviewed the following imaging with scope of interpretation limited to determining acute life threatening conditions related to emergency care: Chest x-ray and agree with the radiologist interpretation with the following exceptions: none I personally reviewed and interpreted cardiac monitoring: normal sinus rhythm  I personally reviewed and interpreted the pt's EKG: see above for interpretation  I have reviewed the patients home medications and made adjustments as needed   Final Clinical Impression(s) / ED Diagnoses Final diagnoses:  COPD exacerbation (HCC)  Chest pain on breathing    Rx / DC Orders ED Discharge Orders          Ordered    predniSONE (DELTASONE) 20 MG tablet  Daily        03/30/23 2231    Methocarbamol 1000 MG TABS  Every 8 hours PRN        03/30/23 2233    azithromycin (ZITHROMAX) 250 MG tablet  Daily        03/30/23 2233              Rondel Baton, MD 03/31/23 1148

## 2023-03-30 NOTE — Discharge Instructions (Addendum)
You were seen for your COPD exacerbation in the emergency department.   At home, please use your inhalers for any wheezing.  Please take the steroids and azithromycin we have prescribed you for your COPD exacerbation as well.  Take the robaxin for your pain. Talk to your primary doctor about a refill of your other pain medication.   Check your MyChart online for the results of any tests that had not resulted by the time you left the emergency department.   Follow-up with your primary doctor in 2-3 days regarding your visit.    Return immediately to the emergency department if you experience any of the following: Difficulty breathing, severe chest pain, or any other concerning symptoms.    Thank you for visiting our Emergency Department. It was a pleasure taking care of you today.

## 2023-03-30 NOTE — ED Triage Notes (Addendum)
Pt bib [facility] for c/o increased sob over last few days, and worse cough. Pt c/o pain in chest from coughing. EMS reports pt wears o2 @ 2l continuously and sats were 98% upon their arrival. Pt also wears CPAP @ HS. Pt reports "deathly afraid of needles". Refuses any IV access or lab work. Pt also wanting different pain medication than the one he takes at home as "they aren't doing any good". Pt with chronic bilateral hip and back pain. EMS also reported pt was given a Duoneb en route.

## 2023-04-03 ENCOUNTER — Encounter (HOSPITAL_BASED_OUTPATIENT_CLINIC_OR_DEPARTMENT_OTHER): Payer: Self-pay | Admitting: Emergency Medicine

## 2023-04-03 ENCOUNTER — Emergency Department (HOSPITAL_BASED_OUTPATIENT_CLINIC_OR_DEPARTMENT_OTHER)
Admission: EM | Admit: 2023-04-03 | Discharge: 2023-04-03 | Discharge status: Against Medical Advice | Payer: HMO | Attending: Emergency Medicine | Admitting: Emergency Medicine

## 2023-04-03 ENCOUNTER — Other Ambulatory Visit: Payer: Self-pay

## 2023-04-03 DIAGNOSIS — M25551 Pain in right hip: Secondary | ICD-10-CM | POA: Insufficient documentation

## 2023-04-03 DIAGNOSIS — M549 Dorsalgia, unspecified: Secondary | ICD-10-CM | POA: Diagnosis not present

## 2023-04-03 DIAGNOSIS — J449 Chronic obstructive pulmonary disease, unspecified: Secondary | ICD-10-CM | POA: Diagnosis not present

## 2023-04-03 DIAGNOSIS — M545 Low back pain, unspecified: Secondary | ICD-10-CM | POA: Diagnosis not present

## 2023-04-03 DIAGNOSIS — I10 Essential (primary) hypertension: Secondary | ICD-10-CM | POA: Diagnosis not present

## 2023-04-03 DIAGNOSIS — Z5321 Procedure and treatment not carried out due to patient leaving prior to being seen by health care provider: Secondary | ICD-10-CM | POA: Insufficient documentation

## 2023-04-03 NOTE — ED Notes (Signed)
 Informed by staff that pt threw a hospital oxygen tank in the bushes as he was getting into a car to leave

## 2023-04-03 NOTE — ED Triage Notes (Signed)
 C/o R sided hip and lower back pain. Hx of same. Refused IV medication w/ EMS. Given 200 mcg intranasal en route. On 2L G[place] at baseline.

## 2023-04-07 DIAGNOSIS — J449 Chronic obstructive pulmonary disease, unspecified: Secondary | ICD-10-CM | POA: Diagnosis not present

## 2023-04-10 MED ORDER — AMLODIPINE BESYLATE 10 MG PO TABS: 10.0000 mg | ORAL_TABLET | Freq: Every day | ORAL | 3 refills | Status: DC

## 2023-04-10 MED ORDER — OXYCODONE HCL 5 MG PO TABS: ORAL_TABLET | ORAL | 0 refills | Status: DC

## 2023-04-10 NOTE — Addendum Note (Signed)
 Addended by: Abbe Amsterdam C on: 04/10/2023 12:58 PM   Modules accepted: Orders

## 2023-04-14 MED ORDER — DOXYCYCLINE HYCLATE 100 MG PO CAPS: 100.0000 mg | ORAL_CAPSULE | Freq: Two times a day (BID) | ORAL | 0 refills | Status: DC

## 2023-04-14 NOTE — Addendum Note (Signed)
 Addended by: Abbe Amsterdam C on: 04/14/2023 12:19 PM   Modules accepted: Orders

## 2023-04-21 DIAGNOSIS — M4135 Thoracogenic scoliosis, thoracolumbar region: Secondary | ICD-10-CM | POA: Diagnosis not present

## 2023-05-02 NOTE — Telephone Encounter (Unsigned)
Copied from CRM [id number]. Topic: Appointments - Scheduling Inquiry for Clinic >> May 01, 2023  4:59 PM Marlow Baars wrote: Reason for CRM: The patient called in very demanding stating he needed to speak directly to the provider or a nurse to see if it is even worth his time to come to see the provider for his appt on 2/10. He was seen recently at [facility] and then transferred to [facility] where he stated he left after 5 hours in the waiting room. I told him this would be a hospital follow up and yes it would be important for him to meet with a provider. He states his last provider didn't want to listen or help and he needs to see that his new provider will listen and help before he even comes in. Please assist patient further

## 2023-05-04 DIAGNOSIS — J449 Chronic obstructive pulmonary disease, unspecified: Secondary | ICD-10-CM | POA: Diagnosis not present

## 2023-05-11 ENCOUNTER — Encounter: Payer: Self-pay | Admitting: Family Medicine

## 2023-05-11 DIAGNOSIS — M25559 Pain in unspecified hip: Secondary | ICD-10-CM

## 2023-05-12 ENCOUNTER — Other Ambulatory Visit: Payer: Self-pay

## 2023-05-12 ENCOUNTER — Inpatient Hospital Stay: Payer: HMO | Admitting: Family Medicine

## 2023-05-12 DIAGNOSIS — J449 Chronic obstructive pulmonary disease, unspecified: Secondary | ICD-10-CM

## 2023-05-12 DIAGNOSIS — M25559 Pain in unspecified hip: Secondary | ICD-10-CM

## 2023-05-12 DIAGNOSIS — G894 Chronic pain syndrome: Secondary | ICD-10-CM

## 2023-05-12 DIAGNOSIS — Z9981 Dependence on supplemental oxygen: Secondary | ICD-10-CM

## 2023-05-12 DIAGNOSIS — R269 Unspecified abnormalities of gait and mobility: Secondary | ICD-10-CM

## 2023-05-12 MED ORDER — MISC. DEVICES KIT: 1.0000 | PACK | Freq: Every day | 0 refills | Status: DC

## 2023-05-12 MED ORDER — ALBUTEROL SULFATE HFA 108 (90 BASE) MCG/ACT IN AERS: 2.0000 | INHALATION_SPRAY | Freq: Four times a day (QID) | RESPIRATORY_TRACT | 5 refills | Status: AC | PRN

## 2023-05-12 MED ORDER — TRIAMCINOLONE ACETONIDE 0.5 % EX CREA: TOPICAL_CREAM | Freq: Two times a day (BID) | CUTANEOUS | 1 refills | Status: DC

## 2023-05-12 MED ORDER — OXYCODONE HCL 5 MG PO TABS: 2.5000 mg | ORAL_TABLET | Freq: Four times a day (QID) | ORAL | 0 refills | Status: DC | PRN

## 2023-05-12 NOTE — Addendum Note (Signed)
 Addended by: Gates Kasal C on: 05/12/2023 12:23 PM   Modules accepted: Orders

## 2023-05-12 NOTE — Telephone Encounter (Signed)
 I called [facility], they sated they need a Rx or Letter as a referral to initiate request for this service. Letter has been routed to you.   Okay for Pain med, inhaler, and Psoriasis refills?

## 2023-05-22 DIAGNOSIS — M4135 Thoracogenic scoliosis, thoracolumbar region: Secondary | ICD-10-CM | POA: Diagnosis not present

## 2023-05-26 ENCOUNTER — Encounter (INDEPENDENT_AMBULATORY_CARE_PROVIDER_SITE_OTHER): Payer: Self-pay | Admitting: Family Medicine

## 2023-05-26 DIAGNOSIS — J441 Chronic obstructive pulmonary disease with (acute) exacerbation: Secondary | ICD-10-CM

## 2023-05-26 MED ORDER — DOXYCYCLINE HYCLATE 100 MG PO CAPS: 100.0000 mg | ORAL_CAPSULE | Freq: Two times a day (BID) | ORAL | 0 refills | Status: DC

## 2023-05-26 NOTE — Telephone Encounter (Signed)

## 2023-06-01 DIAGNOSIS — J449 Chronic obstructive pulmonary disease, unspecified: Secondary | ICD-10-CM | POA: Diagnosis not present

## 2023-06-03 ENCOUNTER — Encounter: Payer: Self-pay | Admitting: Family Medicine

## 2023-06-03 DIAGNOSIS — M25559 Pain in unspecified hip: Secondary | ICD-10-CM

## 2023-06-03 DIAGNOSIS — J449 Chronic obstructive pulmonary disease, unspecified: Secondary | ICD-10-CM

## 2023-06-03 MED ORDER — INCRUSE ELLIPTA 62.5 MCG/ACT IN AEPB: 1.0000 | INHALATION_SPRAY | Freq: Every day | RESPIRATORY_TRACT | 9 refills | Status: AC

## 2023-06-03 MED ORDER — FLUTICASONE-SALMETEROL 100-50 MCG/ACT IN AEPB: 1.0000 | INHALATION_SPRAY | Freq: Two times a day (BID) | RESPIRATORY_TRACT | 9 refills | Status: DC

## 2023-06-03 MED ORDER — OXYCODONE HCL 5 MG PO TABS: ORAL_TABLET | ORAL | 0 refills | Status: DC

## 2023-06-07 ENCOUNTER — Telehealth (HOSPITAL_COMMUNITY): Payer: Self-pay | Admitting: Emergency Medicine

## 2023-06-07 ENCOUNTER — Encounter (HOSPITAL_COMMUNITY): Payer: Self-pay

## 2023-06-07 ENCOUNTER — Other Ambulatory Visit: Payer: Self-pay

## 2023-06-07 ENCOUNTER — Telehealth: Payer: Self-pay

## 2023-06-07 ENCOUNTER — Emergency Department (HOSPITAL_COMMUNITY)

## 2023-06-07 ENCOUNTER — Emergency Department (HOSPITAL_COMMUNITY)
Admission: EM | Admit: 2023-06-07 | Discharge: 2023-06-07 | Disposition: A | Discharge status: Home / Self Care | Attending: Emergency Medicine | Admitting: Emergency Medicine

## 2023-06-07 DIAGNOSIS — Z720 Tobacco use: Secondary | ICD-10-CM | POA: Diagnosis not present

## 2023-06-07 DIAGNOSIS — M549 Dorsalgia, unspecified: Secondary | ICD-10-CM | POA: Diagnosis not present

## 2023-06-07 DIAGNOSIS — J209 Acute bronchitis, unspecified: Secondary | ICD-10-CM | POA: Insufficient documentation

## 2023-06-07 DIAGNOSIS — J441 Chronic obstructive pulmonary disease with (acute) exacerbation: Secondary | ICD-10-CM | POA: Insufficient documentation

## 2023-06-07 DIAGNOSIS — R0602 Shortness of breath: Secondary | ICD-10-CM | POA: Diagnosis not present

## 2023-06-07 DIAGNOSIS — R069 Unspecified abnormalities of breathing: Secondary | ICD-10-CM | POA: Diagnosis not present

## 2023-06-07 DIAGNOSIS — F419 Anxiety disorder, unspecified: Secondary | ICD-10-CM | POA: Diagnosis not present

## 2023-06-07 LAB — RESP PANEL BY RT-PCR (RSV, FLU A&B, COVID)  RVPGX2
Influenza A by PCR: NEGATIVE
Influenza B by PCR: NEGATIVE
Resp Syncytial Virus by PCR: NEGATIVE
SARS Coronavirus 2 by RT PCR: NEGATIVE

## 2023-06-07 MED ORDER — PREDNISONE 50 MG PO TABS: 50.0000 mg | ORAL_TABLET | Freq: Every day | ORAL | 0 refills | Status: DC

## 2023-06-07 MED ORDER — AMOXICILLIN-POT CLAVULANATE 875-125 MG PO TABS: 1.0000 | ORAL_TABLET | Freq: Two times a day (BID) | ORAL | 0 refills | Status: DC

## 2023-06-07 MED ORDER — AMOXICILLIN-POT CLAVULANATE 875-125 MG PO TABS
1.0000 | ORAL_TABLET | Freq: Once | ORAL | Status: AC
  Administered 2023-06-07: 1 via ORAL
  Filled 2023-06-07: qty 1

## 2023-06-07 MED ORDER — DOXYCYCLINE HYCLATE 100 MG PO CAPS: 100.0000 mg | ORAL_CAPSULE | Freq: Two times a day (BID) | ORAL | 0 refills | Status: DC

## 2023-06-07 MED ORDER — PREDNISONE 20 MG PO TABS
60.0000 mg | ORAL_TABLET | Freq: Once | ORAL | Status: AC
  Administered 2023-06-07: 60 mg via ORAL
  Filled 2023-06-07: qty 3

## 2023-06-07 MED ORDER — NICOTINE 21 MG/24HR TD PT24: 21.0000 mg | MEDICATED_PATCH | Freq: Every day | TRANSDERMAL | 0 refills | Status: DC

## 2023-06-07 MED ORDER — OXYCODONE HCL 5 MG PO TABS
5.0000 mg | ORAL_TABLET | Freq: Once | ORAL | Status: AC
  Administered 2023-06-07: 5 mg via ORAL
  Filled 2023-06-07: qty 1

## 2023-06-07 MED ORDER — DOXYCYCLINE HYCLATE 100 MG PO TABS
100.0000 mg | ORAL_TABLET | Freq: Once | ORAL | Status: DC
  Filled 2023-06-07: qty 1

## 2023-06-07 MED ORDER — ALPRAZOLAM 0.25 MG PO TABS
1.0000 mg | ORAL_TABLET | Freq: Once | ORAL | Status: AC
  Administered 2023-06-07: 1 mg via ORAL
  Filled 2023-06-07: qty 4

## 2023-06-07 MED ORDER — ALBUTEROL SULFATE (2.5 MG/3ML) 0.083% IN NEBU
5.0000 mg | INHALATION_SOLUTION | Freq: Once | RESPIRATORY_TRACT | Status: AC
  Administered 2023-06-07: 5 mg via RESPIRATORY_TRACT
  Filled 2023-06-07: qty 6

## 2023-06-07 MED ORDER — ALPRAZOLAM 0.5 MG PO TABS: 0.5000 mg | ORAL_TABLET | Freq: Every day | ORAL | 0 refills | Status: DC | PRN

## 2023-06-07 NOTE — ED Triage Notes (Signed)
 Pt BIB [facility] from home c/o [facility] x2 days that has gotten worse. Pt gave himself an albuterol/atrovent neb when EMS arrived. PT wears 2-3L at baseline. Pt refusing blood work and an IV. EDP is aware and spoke to pt.

## 2023-06-07 NOTE — Telephone Encounter (Signed)
 wIfe called in to  ask if the script could be changed to [facility] on [address], they apparently had a emergency at [employer] and closed. Prescription called in as written. To [facility]

## 2023-06-07 NOTE — ED Provider Notes (Signed)
 [place] EMERGENCY DEPARTMENT AT [facility] Provider Note   CSN: [id number] Arrival date & time: 06/07/23  0720     History  Chief Complaint  Patient presents with   Shortness of Breath    Grant Galvan is a 63 y.o. male.  Pt is a 63 yo male with pmhx significant for COPD (3L baseline), tobacco abuse, chronic back pain, and severe needle phobia.  Pt has had sob for 2 days.  He called EMS this am.  He was giving himself a duoneb when EMS arrived and was not given anything else on the way.  Pt is refusing IV or any kind of blood work.         Home Medications Prior to Admission medications   Medication Sig Start Date End Date Taking? Authorizing Provider  ALPRAZolam Prudy Feeler) 0.5 MG tablet Take 1 tablet (0.5 mg total) by mouth daily as needed for anxiety. 06/07/23  Yes Jacalyn Lefevre, MD  amoxicillin-clavulanate (AUGMENTIN) 875-125 MG tablet Take 1 tablet by mouth every 12 (twelve) hours. 06/07/23  Yes Jacalyn Lefevre, MD  nicotine (NICODERM CQ - DOSED IN MG/24 HOURS) 21 mg/24hr patch Place 1 patch (21 mg total) onto the skin daily. 06/07/23  Yes Jacalyn Lefevre, MD  predniSONE (DELTASONE) 50 MG tablet Take 1 tablet (50 mg total) by mouth daily with breakfast. 06/07/23  Yes Jacalyn Lefevre, MD  albuterol (VENTOLIN HFA) 108 (90 Base) MCG/ACT inhaler Inhale 2 puffs into the lungs every 6 (six) hours as needed for wheezing or shortness of breath. 05/12/23   Copland, Gwenlyn Found, MD  ALPRAZolam Prudy Feeler) 1 MG tablet Take 1/2 or 1 by mouth for anxiety 30 minutes prior to blood draw 01/16/22   Copland, Gwenlyn Found, MD  amLODipine (NORVASC) 10 MG tablet Take 1 tablet (10 mg total) by mouth daily. 04/10/23   Copland, Gwenlyn Found, MD  azithromycin (ZITHROMAX) 250 MG tablet Take 1 tablet (250 mg total) by mouth daily. Take first 2 tablets together, then 1 every day until finished. 03/30/23   Rondel Baton, MD  clotrimazole-betamethasone (LOTRISONE) cream Apply 1 Application topically 2 (two)  times daily. 11/29/21   Jacquelin Hawking, PA-C  doxycycline (VIBRA-TABS) 100 MG tablet Take 1 tablet (100 mg total) by mouth 2 (two) times daily. 11/15/22   Copland, Gwenlyn Found, MD  doxycycline (VIBRAMYCIN) 100 MG capsule Take 1 capsule (100 mg total) by mouth 2 (two) times daily. 01/08/23   Copland, Gwenlyn Found, MD  doxycycline (VIBRAMYCIN) 100 MG capsule Take 1 capsule (100 mg total) by mouth 2 (two) times daily. 05/26/23   Copland, Gwenlyn Found, MD  DULoxetine (CYMBALTA) 60 MG capsule Take 1 capsule (60 mg total) by mouth daily. 09/16/22   Copland, Gwenlyn Found, MD  fluticasone-salmeterol (ADVAIR) 100-50 MCG/ACT AEPB Inhale 1 puff into the lungs 2 (two) times daily. 06/03/23   Copland, Gwenlyn Found, MD  gabapentin (NEURONTIN) 300 MG capsule Take 1 capsule (300 mg total) by mouth 3 (three) times daily. 01/22/22   Copland, Gwenlyn Found, MD  hydrochlorothiazide (HYDRODIURIL) 25 MG tablet Take 1 tablet (25 mg total) by mouth daily. Use as needed for swelling 09/04/22   Copland, Gwenlyn Found, MD  ibuprofen (ADVIL) 800 MG tablet Take 1 tablet (800 mg total) by mouth 3 (three) times daily. 03/28/22   Pricilla Loveless, MD  ipratropium-albuterol (DUONEB) 0.5-2.5 (3) MG/3ML SOLN Take 3 mLs by nebulization every 6 (six) hours as needed. 11/12/22   Copland, Gwenlyn Found, MD  Methocarbamol 1000 MG TABS Take  1,000 mg by mouth every 8 (eight) hours as needed. 03/30/23   Rondel Baton, MD  Misc. Devices KIT 1 each by Does not apply route daily. Mobility Chair 05/12/23   Copland, Gwenlyn Found, MD  oxyCODONE (OXY IR/ROXICODONE) 5 MG immediate release tablet TAKE ONE-HALF TO ONE TABLET (2.5 TO 5MG  TOTAL) BY MOUTH EVERY 6 HOURS AS NEEDED FOR SEVERE PAIN 06/03/23   Copland, Gwenlyn Found, MD  oxyCODONE (ROXICODONE) 5 MG immediate release tablet Take 0.5-1 tablets (2.5-5 mg total) by mouth every 6 (six) hours as needed for severe pain (pain score 7-10). 05/12/23   Copland, Gwenlyn Found, MD  promethazine-dextromethorphan (PROMETHAZINE-DM) 6.25-15 MG/5ML syrup  Take 2.5 mLs by mouth 4 (four) times daily as needed for cough. 09/04/22   Copland, Gwenlyn Found, MD  Respiratory Therapy Supplies (NEBULIZER/TUBING/MOUTHPIECE) KIT 1 each by Does not apply route as directed. 08/28/22   Copland, Gwenlyn Found, MD  rosuvastatin (CRESTOR) 10 MG tablet Take 1 tablet (10 mg total) by mouth daily. 01/28/23   Copland, Gwenlyn Found, MD  triamcinolone cream (KENALOG) 0.5 % Apply topically 2 (two) times daily. Use as needed for rash on extremities 05/12/23   Copland, Gwenlyn Found, MD  umeclidinium bromide (INCRUSE ELLIPTA) 62.5 MCG/ACT AEPB Inhale 1 puff into the lungs daily. 06/03/23   Copland, Gwenlyn Found, MD      Allergies    Celecoxib, Cyclobenzaprine hcl, and Tramadol    Review of Systems   Review of Systems  Respiratory:  Positive for shortness of breath and wheezing.   All other systems reviewed and are negative.   Physical Exam Updated Vital Signs BP (!) 120/95 (BP Location: Right Arm)   Pulse 96   Temp 98.4 F (36.9 C) (Oral)   Resp 19   Ht 6\' 3"  (1.905 m)   Wt 81.6 kg   SpO2 100%   BMI 22.50 kg/m  Physical Exam Vitals and nursing note reviewed.  Constitutional:      Appearance: He is well-developed.  HENT:     Head: Normocephalic and atraumatic.     Mouth/Throat:     Mouth: Mucous membranes are moist.     Pharynx: Oropharynx is clear.  Eyes:     Extraocular Movements: Extraocular movements intact.     Pupils: Pupils are equal, round, and reactive to light.  Cardiovascular:     Rate and Rhythm: Normal rate and regular rhythm.  Pulmonary:     Breath sounds: Wheezing present.  Abdominal:     General: Bowel sounds are normal.     Palpations: Abdomen is soft.  Musculoskeletal:        General: Normal range of motion.     Cervical back: Normal range of motion and neck supple.  Skin:    General: Skin is warm.     Capillary Refill: Capillary refill takes less than 2 seconds.  Neurological:     General: No focal deficit present.     Mental Status: He is  alert and oriented to person, place, and time.  Psychiatric:        Mood and Affect: Mood normal.        Behavior: Behavior normal.     ED Results / Procedures / Treatments   Labs (all labs ordered are listed, but only abnormal results are displayed) Labs Reviewed  RESP PANEL BY RT-PCR (RSV, FLU A&B, COVID)  [id number]    EKG EKG Interpretation Date/Time:  Saturday June 07 2023 07:29:59 EST Ventricular Rate:  87 PR Interval:  148  QRS Duration:  129 QT Interval:  381 QTC Calculation: 459 R Axis:   89  Text Interpretation: Sinus rhythm Consider right atrial enlargement Nonspecific intraventricular conduction delay No significant change since last tracing Confirmed by Jacalyn Lefevre [phone]) on 06/07/2023 7:36:20 AM  Radiology DG Chest Portable 1 View Result Date: 06/07/2023 CLINICAL DATA:  Shortness of Breath. EXAM: PORTABLE CHEST 1 VIEW COMPARISON:  03/30/2023 FINDINGS: Lungs are markedly hyperexpanded. Interstitial markings are diffusely coarsened with chronic features. Architectural distortion/scarring noted in both apices, similar to prior. The cardiopericardial silhouette is within normal limits for size. Telemetry leads overlie the chest. IMPRESSION: Marked hyperexpansion with chronic interstitial coarsening. No acute cardiopulmonary findings. Electronically Signed   By: Kennith Center M.D.   On: 06/07/2023 07:49    Procedures Procedures    Medications Ordered in ED Medications  doxycycline (VIBRA-TABS) tablet 100 mg (has no administration in time range)  amoxicillin-clavulanate (AUGMENTIN) 875-125 MG per tablet 1 tablet (has no administration in time range)  predniSONE (DELTASONE) tablet 60 mg (60 mg Oral Given 06/07/23 0731)  albuterol (PROVENTIL) (2.5 MG/3ML) 0.083% nebulizer solution 5 mg (5 mg Nebulization Given 06/07/23 0743)  ALPRAZolam Prudy Feeler) tablet 1 mg (1 mg Oral Given 06/07/23 0738)  oxyCODONE (Oxy IR/ROXICODONE) immediate release tablet 5 mg (5 mg Oral Given 06/07/23 6045)     ED Course/ Medical Decision Making/ A&P                                 Medical Decision Making Amount and/or Complexity of Data Reviewed Radiology: ordered.  Risk OTC drugs. Prescription drug management.   This patient presents to the ED for concern of sob, this involves an extensive number of treatment options, and is a complaint that carries with it a high risk of complications and morbidity.  The differential diagnosis includes copd exac, pna, covid/flu/rsv   Co morbidities that complicate the patient evaluation  COPD (3L baseline), tobacco abuse, chronic back pain, and severe needle phobia   Additional history obtained:  Additional history obtained from epic chart review External records from outside source obtained and reviewed including EMS report   Lab Tests:  I Ordered, and personally interpreted labs.  The pertinent results include:  covid/flu/rsv neg   Imaging Studies ordered:  I ordered imaging studies including cxr  I independently visualized and interpreted imaging which showed  Marked hyperexpansion with chronic interstitial coarsening. No acute  cardiopulmonary findings.   I agree with the radiologist interpretation   Cardiac Monitoring:  The patient was maintained on a cardiac monitor.  I personally viewed and interpreted the cardiac monitored which showed an underlying rhythm of: nsr   Medicines ordered and prescription drug management:  I ordered medication including prednisone/nebs  for sx  Reevaluation of the patient after these medicines showed that the patient improved I have reviewed the patients home medicines and have made adjustments as needed   Critical Interventions:  Nebs/steroids   Problem List / ED Course:  COPD exacerbation:  pt feels like he is close to his baseline after nebs and prednisone.  He is able to ambulate with O2 sats in the upper 90s on his normal oxygen. Pt said he just finished doxy, so he's given a rx  for augmentin for bronchitis.  Anxiety:  Pt gets very anxious when he gets sob, so he's given a few Xanax.  He had one today which he feel really helped.   Tobacco abuse:  pt said he was going to try the nicotine gum, but read that it can cause htn, so did not take it.  I told him that would be much better than cigarettes.  Pt is willing to try the patches, so he's given a rx for those.   Reevaluation:  After the interventions noted above, I reevaluated the patient and found that they have :improved   Social Determinants of Health:  Lives at home   Dispostion:  After consideration of the diagnostic results and the patients response to treatment, I feel that the patent would benefit from discharge with outpatient f/u.          Final Clinical Impression(s) / ED Diagnoses Final diagnoses:  COPD exacerbation (HCC)  Acute bronchitis, unspecified organism  Tobacco abuse  Anxiety    Rx / DC Orders ED Discharge Orders          Ordered    ALPRAZolam (XANAX) 0.5 MG tablet  Daily PRN        06/07/23 0954    doxycycline (VIBRAMYCIN) 100 MG capsule  2 times daily,   Status:  Discontinued        06/07/23 0954    predniSONE (DELTASONE) 50 MG tablet  Daily with breakfast        06/07/23 0954    nicotine (NICODERM CQ - DOSED IN MG/24 HOURS) 21 mg/24hr patch  Daily        06/07/23 0954    amoxicillin-clavulanate (AUGMENTIN) 875-125 MG tablet  Every 12 hours        06/07/23 0957              Jacalyn Lefevre, MD 06/07/23 [phone]

## 2023-06-10 DIAGNOSIS — M25552 Pain in left hip: Secondary | ICD-10-CM | POA: Diagnosis not present

## 2023-06-10 DIAGNOSIS — R531 Weakness: Secondary | ICD-10-CM | POA: Diagnosis not present

## 2023-06-10 DIAGNOSIS — R262 Difficulty in walking, not elsewhere classified: Secondary | ICD-10-CM | POA: Diagnosis not present

## 2023-06-10 DIAGNOSIS — M25551 Pain in right hip: Secondary | ICD-10-CM | POA: Diagnosis not present

## 2023-06-19 DIAGNOSIS — M4135 Thoracogenic scoliosis, thoracolumbar region: Secondary | ICD-10-CM | POA: Diagnosis not present

## 2023-07-02 DIAGNOSIS — J449 Chronic obstructive pulmonary disease, unspecified: Secondary | ICD-10-CM | POA: Diagnosis not present

## 2023-07-03 ENCOUNTER — Telehealth: Payer: Self-pay | Admitting: Emergency Medicine

## 2023-07-03 NOTE — Telephone Encounter (Signed)
 Copied from CRM [id number]. Topic: General - Other >> Jul 03, 2023 10:06 AM Cammy Copa D wrote: Reason for CRM: Angie calling from [employer] regarding paperwork that needs to be sent over. Stated that paperwork was faxed over on 3/25 but returned with only 1 page, should be multiple pages. She re-faxed the paperwork and wanted to confirm whether or not it was received.

## 2023-07-07 ENCOUNTER — Other Ambulatory Visit: Payer: Self-pay

## 2023-07-07 ENCOUNTER — Encounter: Payer: Self-pay | Admitting: Family Medicine

## 2023-07-07 DIAGNOSIS — I2510 Atherosclerotic heart disease of native coronary artery without angina pectoris: Secondary | ICD-10-CM

## 2023-07-07 DIAGNOSIS — M25559 Pain in unspecified hip: Secondary | ICD-10-CM

## 2023-07-07 DIAGNOSIS — R600 Localized edema: Secondary | ICD-10-CM

## 2023-07-07 MED ORDER — CLOTRIMAZOLE-BETAMETHASONE 1-0.05 % EX CREA: 1.0000 | TOPICAL_CREAM | Freq: Two times a day (BID) | CUTANEOUS | 2 refills | Status: AC

## 2023-07-07 MED ORDER — TRIAMCINOLONE ACETONIDE 0.5 % EX CREA: TOPICAL_CREAM | Freq: Two times a day (BID) | CUTANEOUS | 1 refills | Status: AC

## 2023-07-07 MED ORDER — OXYCODONE HCL 5 MG PO TABS
2.5000 mg | ORAL_TABLET | Freq: Four times a day (QID) | ORAL | 0 refills | Status: DC | PRN
Start: 2023-07-07 — End: 2023-08-03

## 2023-07-09 ENCOUNTER — Telehealth: Payer: Self-pay | Admitting: Family Medicine

## 2023-07-09 MED ORDER — AMOXICILLIN-POT CLAVULANATE 875-125 MG PO TABS: 1.0000 | ORAL_TABLET | Freq: Two times a day (BID) | ORAL | 0 refills | Status: DC

## 2023-07-09 NOTE — Telephone Encounter (Signed)
 Copied from CRM [id number]. Topic: Medicare AWV >> Jul 09, 2023  2:48 PM Payton Doughty wrote: Reason for CRM: Called LVM 07/09/2023 to schedule AWV. Please schedule Virtual or Telehealth visits ONLY.   Verlee Rossetti; Care Guide Ambulatory Clinical Support [address] [facility] Direct Dial: [phone]

## 2023-07-09 NOTE — Addendum Note (Signed)
 Addended by: Abbe Amsterdam C on: 07/09/2023 05:42 PM   Modules accepted: Orders

## 2023-07-11 ENCOUNTER — Emergency Department (HOSPITAL_COMMUNITY)

## 2023-07-11 ENCOUNTER — Encounter (HOSPITAL_COMMUNITY): Payer: Self-pay

## 2023-07-11 ENCOUNTER — Other Ambulatory Visit: Payer: Self-pay

## 2023-07-11 ENCOUNTER — Observation Stay (HOSPITAL_COMMUNITY)
Admission: EM | Admit: 2023-07-11 | Discharge: 2023-07-14 | Disposition: A | Discharge status: Home / Self Care | Attending: Family Medicine | Admitting: Family Medicine

## 2023-07-11 DIAGNOSIS — Z79899 Other long term (current) drug therapy: Secondary | ICD-10-CM | POA: Diagnosis not present

## 2023-07-11 DIAGNOSIS — G8929 Other chronic pain: Secondary | ICD-10-CM | POA: Diagnosis not present

## 2023-07-11 DIAGNOSIS — I10 Essential (primary) hypertension: Secondary | ICD-10-CM | POA: Insufficient documentation

## 2023-07-11 DIAGNOSIS — J441 Chronic obstructive pulmonary disease with (acute) exacerbation: Secondary | ICD-10-CM | POA: Diagnosis not present

## 2023-07-11 DIAGNOSIS — F17210 Nicotine dependence, cigarettes, uncomplicated: Secondary | ICD-10-CM | POA: Insufficient documentation

## 2023-07-11 DIAGNOSIS — J9621 Acute and chronic respiratory failure with hypoxia: Secondary | ICD-10-CM | POA: Diagnosis not present

## 2023-07-11 DIAGNOSIS — F40298 Other specified phobia: Secondary | ICD-10-CM | POA: Diagnosis not present

## 2023-07-11 DIAGNOSIS — F419 Anxiety disorder, unspecified: Secondary | ICD-10-CM | POA: Diagnosis not present

## 2023-07-11 DIAGNOSIS — F40231 Fear of injections and transfusions: Secondary | ICD-10-CM | POA: Diagnosis not present

## 2023-07-11 DIAGNOSIS — E785 Hyperlipidemia, unspecified: Secondary | ICD-10-CM | POA: Diagnosis not present

## 2023-07-11 DIAGNOSIS — R0602 Shortness of breath: Secondary | ICD-10-CM | POA: Diagnosis not present

## 2023-07-11 DIAGNOSIS — Z720 Tobacco use: Secondary | ICD-10-CM | POA: Diagnosis present

## 2023-07-11 DIAGNOSIS — Z8679 Personal history of other diseases of the circulatory system: Secondary | ICD-10-CM | POA: Diagnosis present

## 2023-07-11 DIAGNOSIS — R0603 Acute respiratory distress: Secondary | ICD-10-CM | POA: Diagnosis not present

## 2023-07-11 DIAGNOSIS — J9620 Acute and chronic respiratory failure, unspecified whether with hypoxia or hypercapnia: Secondary | ICD-10-CM | POA: Diagnosis present

## 2023-07-11 MED ORDER — ALBUTEROL SULFATE (2.5 MG/3ML) 0.083% IN NEBU
2.5000 mg | INHALATION_SOLUTION | RESPIRATORY_TRACT | Status: DC | PRN
  Administered 2023-07-11 – 2023-07-12 (×2): 2.5 mg via RESPIRATORY_TRACT
  Filled 2023-07-11 (×3): qty 3

## 2023-07-11 MED ORDER — AMLODIPINE BESYLATE 10 MG PO TABS
10.0000 mg | ORAL_TABLET | Freq: Every day | ORAL | Status: DC
  Administered 2023-07-12 – 2023-07-14 (×3): 10 mg via ORAL
  Filled 2023-07-11 (×3): qty 1

## 2023-07-11 MED ORDER — SODIUM CHLORIDE 0.9% FLUSH: 3.0000 mL | Freq: Two times a day (BID) | INTRAVENOUS | Status: DC

## 2023-07-11 MED ORDER — SODIUM CHLORIDE 0.9 % IV BOLUS: 500.0000 mL | Freq: Once | INTRAVENOUS | Status: DC

## 2023-07-11 MED ORDER — BUDESONIDE 0.5 MG/2ML IN SUSP
0.5000 mg | Freq: Two times a day (BID) | RESPIRATORY_TRACT | Status: DC
  Administered 2023-07-11 (×2): 0.5 mg via RESPIRATORY_TRACT
  Filled 2023-07-11 (×2): qty 2

## 2023-07-11 MED ORDER — ALBUTEROL SULFATE (2.5 MG/3ML) 0.083% IN NEBU
10.0000 mg/h | INHALATION_SOLUTION | Freq: Once | RESPIRATORY_TRACT | Status: AC
  Administered 2023-07-11: 10 mg/h via RESPIRATORY_TRACT
  Filled 2023-07-11: qty 12

## 2023-07-11 MED ORDER — TRIAMCINOLONE ACETONIDE 0.5 % EX CREA
TOPICAL_CREAM | Freq: Two times a day (BID) | CUTANEOUS | Status: DC
  Filled 2023-07-11: qty 15

## 2023-07-11 MED ORDER — ROSUVASTATIN CALCIUM 5 MG PO TABS
10.0000 mg | ORAL_TABLET | Freq: Every day | ORAL | Status: DC
  Administered 2023-07-12 – 2023-07-14 (×3): 10 mg via ORAL
  Filled 2023-07-11 (×3): qty 2

## 2023-07-11 MED ORDER — ARFORMOTEROL TARTRATE 15 MCG/2ML IN NEBU
15.0000 ug | INHALATION_SOLUTION | Freq: Two times a day (BID) | RESPIRATORY_TRACT | Status: DC
  Administered 2023-07-11 (×2): 15 ug via RESPIRATORY_TRACT
  Filled 2023-07-11 (×2): qty 2

## 2023-07-11 MED ORDER — ENOXAPARIN SODIUM 40 MG/0.4ML IJ SOSY
40.0000 mg | PREFILLED_SYRINGE | INTRAMUSCULAR | Status: DC
  Filled 2023-07-11 (×3): qty 0.4

## 2023-07-11 MED ORDER — ALPRAZOLAM 0.5 MG PO TABS
0.5000 mg | ORAL_TABLET | Freq: Two times a day (BID) | ORAL | Status: DC | PRN
  Administered 2023-07-11 – 2023-07-14 (×7): 0.5 mg via ORAL
  Filled 2023-07-11 (×7): qty 1

## 2023-07-11 MED ORDER — OXYCODONE HCL 5 MG PO TABS
5.0000 mg | ORAL_TABLET | Freq: Once | ORAL | Status: AC
  Administered 2023-07-11: 5 mg via ORAL
  Filled 2023-07-11: qty 1

## 2023-07-11 MED ORDER — AMOXICILLIN-POT CLAVULANATE 875-125 MG PO TABS
1.0000 | ORAL_TABLET | Freq: Two times a day (BID) | ORAL | Status: DC
  Administered 2023-07-11 – 2023-07-14 (×7): 1 via ORAL
  Filled 2023-07-11 (×7): qty 1

## 2023-07-11 MED ORDER — IPRATROPIUM-ALBUTEROL 0.5-2.5 (3) MG/3ML IN SOLN
3.0000 mL | Freq: Four times a day (QID) | RESPIRATORY_TRACT | Status: DC
  Administered 2023-07-11 – 2023-07-13 (×9): 3 mL via RESPIRATORY_TRACT
  Filled 2023-07-11 (×9): qty 3

## 2023-07-11 MED ORDER — IPRATROPIUM-ALBUTEROL 0.5-2.5 (3) MG/3ML IN SOLN
3.0000 mL | Freq: Four times a day (QID) | RESPIRATORY_TRACT | Status: DC
  Administered 2023-07-11: 3 mL via RESPIRATORY_TRACT
  Filled 2023-07-11: qty 3

## 2023-07-11 MED ORDER — IPRATROPIUM BROMIDE 0.02 % IN SOLN
0.5000 mg | Freq: Once | RESPIRATORY_TRACT | Status: AC
  Administered 2023-07-11: 0.5 mg via RESPIRATORY_TRACT
  Filled 2023-07-11: qty 2.5

## 2023-07-11 MED ORDER — OXYCODONE HCL 5 MG PO TABS
5.0000 mg | ORAL_TABLET | Freq: Four times a day (QID) | ORAL | Status: DC | PRN
  Administered 2023-07-11 – 2023-07-12 (×5): 10 mg via ORAL
  Administered 2023-07-12: 5 mg via ORAL
  Administered 2023-07-13 – 2023-07-14 (×5): 10 mg via ORAL
  Filled 2023-07-11 (×12): qty 2

## 2023-07-11 MED ORDER — CLOTRIMAZOLE-BETAMETHASONE 1-0.05 % EX CREA
1.0000 | TOPICAL_CREAM | Freq: Two times a day (BID) | CUTANEOUS | Status: DC
  Administered 2023-07-12 – 2023-07-14 (×4): 1 via TOPICAL
  Filled 2023-07-11: qty 15

## 2023-07-11 MED ORDER — PREDNISONE 50 MG PO TABS
60.0000 mg | ORAL_TABLET | Freq: Every day | ORAL | Status: DC
  Administered 2023-07-12 – 2023-07-14 (×3): 60 mg via ORAL
  Filled 2023-07-11 (×3): qty 1

## 2023-07-11 MED ORDER — HYDROCHLOROTHIAZIDE 25 MG PO TABS: 25.0000 mg | ORAL_TABLET | Freq: Every day | ORAL | Status: DC | PRN

## 2023-07-11 MED ORDER — METHYLPREDNISOLONE SODIUM SUCC 125 MG IJ SOLR: 125.0000 mg | Freq: Once | INTRAMUSCULAR | Status: DC

## 2023-07-11 MED ORDER — PREDNISONE 20 MG PO TABS
60.0000 mg | ORAL_TABLET | Freq: Once | ORAL | Status: AC
  Administered 2023-07-11: 60 mg via ORAL
  Filled 2023-07-11: qty 3

## 2023-07-11 MED ORDER — GUAIFENESIN ER 600 MG PO TB12
600.0000 mg | ORAL_TABLET | Freq: Two times a day (BID) | ORAL | Status: DC
  Administered 2023-07-11 – 2023-07-14 (×7): 600 mg via ORAL
  Filled 2023-07-11 (×7): qty 1

## 2023-07-11 NOTE — ED Notes (Signed)
 Pt refusing needle sticks. Unable to give PO meds d/t pt on bipap.

## 2023-07-11 NOTE — Progress Notes (Signed)
 Pt refusing any kind of needsticks including PIV and lab draws. States, " I don't do needles." MD notified.

## 2023-07-11 NOTE — ED Triage Notes (Signed)
 Pt to ED via [facility] from home. Pt woke up this am with shortness of breath, RR=40s, tripoding on EMS arrival. Pt has had cpap w/duoneb. Pt has had improved breath sounds per EMS on arrival. Pt refused piv start en route.  Pt wears 2-3L per [facility] at home. Pt continues to have shortness of breath, denies pain on arrival.   Cbg =172 102/60 RR=40s

## 2023-07-11 NOTE — ED Provider Notes (Addendum)
 [place] EMERGENCY DEPARTMENT AT [facility] Provider Note   CSN: [id number] Arrival date & time: 07/11/23  0747     History  Chief Complaint  Patient presents with   Shortness of Breath    Grant Galvan is a 63 y.o. male.  63 year old male COPD on baseline 2 to 3 L of oxygen presenting the emergency department in respiratory distress.  Has been coughing for the past 4 days with productive cough.  Shortness of breath seemingly acutely worsened this morning and did not improve after getting home breathing treatments.  EMS reported patient with significant respiratory distress, tripoding with oxygen saturation 90% with nebulizer ongoing.  Patient reports some improvement, but still having shortness of breath.   Shortness of Breath      Home Medications Prior to Admission medications   Medication Sig Start Date End Date Taking? Authorizing Provider  albuterol (VENTOLIN HFA) 108 (90 Base) MCG/ACT inhaler Inhale 2 puffs into the lungs every 6 (six) hours as needed for wheezing or shortness of breath. 05/12/23   Copland, Gwenlyn Found, MD  ALPRAZolam Prudy Feeler) 0.5 MG tablet Take 1 tablet (0.5 mg total) by mouth daily as needed for anxiety. 06/07/23   Jacalyn Lefevre, MD  amLODipine (NORVASC) 10 MG tablet Take 1 tablet (10 mg total) by mouth daily. 04/10/23   Copland, Gwenlyn Found, MD  amoxicillin-clavulanate (AUGMENTIN) 875-125 MG tablet Take 1 tablet by mouth every 12 (twelve) hours. 06/07/23   Jacalyn Lefevre, MD  amoxicillin-clavulanate (AUGMENTIN) 875-125 MG tablet Take 1 tablet by mouth 2 (two) times daily. 07/09/23   Copland, Gwenlyn Found, MD  clotrimazole-betamethasone (LOTRISONE) cream Apply 1 Application topically 2 (two) times daily. 07/07/23   Copland, Gwenlyn Found, MD  DULoxetine (CYMBALTA) 60 MG capsule Take 1 capsule (60 mg total) by mouth daily. 09/16/22   Copland, Gwenlyn Found, MD  fluticasone-salmeterol (ADVAIR) 100-50 MCG/ACT AEPB Inhale 1 puff into the lungs 2 (two) times daily.  06/03/23   Copland, Gwenlyn Found, MD  gabapentin (NEURONTIN) 300 MG capsule Take 1 capsule (300 mg total) by mouth 3 (three) times daily. 01/22/22   Copland, Gwenlyn Found, MD  hydrochlorothiazide (HYDRODIURIL) 25 MG tablet Take 1 tablet (25 mg total) by mouth daily. Use as needed for swelling 09/04/22   Copland, Gwenlyn Found, MD  ibuprofen (ADVIL) 800 MG tablet Take 1 tablet (800 mg total) by mouth 3 (three) times daily. 03/28/22   Pricilla Loveless, MD  ipratropium-albuterol (DUONEB) 0.5-2.5 (3) MG/3ML SOLN Take 3 mLs by nebulization every 6 (six) hours as needed. 11/12/22   Copland, Gwenlyn Found, MD  Methocarbamol 1000 MG TABS Take 1,000 mg by mouth every 8 (eight) hours as needed. 03/30/23   Rondel Baton, MD  Misc. Devices KIT 1 each by Does not apply route daily. Mobility Chair 05/12/23   Copland, Gwenlyn Found, MD  nicotine (NICODERM CQ - DOSED IN MG/24 HOURS) 21 mg/24hr patch Place 1 patch (21 mg total) onto the skin daily. 06/07/23   Jacalyn Lefevre, MD  oxyCODONE (OXY IR/ROXICODONE) 5 MG immediate release tablet TAKE ONE-HALF TO ONE TABLET (2.5 TO 5MG  TOTAL) BY MOUTH EVERY 6 HOURS AS NEEDED FOR SEVERE PAIN 06/03/23   Copland, Gwenlyn Found, MD  oxyCODONE (ROXICODONE) 5 MG immediate release tablet Take 0.5-1 tablets (2.5-5 mg total) by mouth every 6 (six) hours as needed for severe pain (pain score 7-10). 07/07/23   Copland, Gwenlyn Found, MD  predniSONE (DELTASONE) 50 MG tablet Take 1 tablet (50 mg total) by mouth daily with  breakfast. 06/07/23   Jacalyn Lefevre, MD  Respiratory Therapy Supplies (NEBULIZER/TUBING/MOUTHPIECE) KIT 1 each by Does not apply route as directed. 08/28/22   Copland, Gwenlyn Found, MD  rosuvastatin (CRESTOR) 10 MG tablet Take 1 tablet (10 mg total) by mouth daily. 01/28/23   Copland, Gwenlyn Found, MD  triamcinolone cream (KENALOG) 0.5 % Apply topically 2 (two) times daily. Use as needed for rash on extremities 07/07/23   Copland, Gwenlyn Found, MD  umeclidinium bromide (INCRUSE ELLIPTA) 62.5 MCG/ACT AEPB Inhale 1  puff into the lungs daily. 06/03/23   Copland, Gwenlyn Found, MD      Allergies    Celecoxib, Cyclobenzaprine hcl, and Tramadol    Review of Systems   Review of Systems  Respiratory:  Positive for shortness of breath.     Physical Exam Updated Vital Signs BP 90/70   Pulse (!) 102   Temp (!) 97.5 F (36.4 C) (Axillary)   Resp 13   Ht 6\' 3"  (1.905 m)   Wt 80 kg   SpO2 100%   BMI 22.04 kg/m  Physical Exam Vitals reviewed.  Constitutional:      General: He is in acute distress.     Appearance: He is not toxic-appearing or diaphoretic.  HENT:     Head: Normocephalic.  Cardiovascular:     Rate and Rhythm: Regular rhythm. Tachycardia present.  Pulmonary:     Effort: Tachypnea and respiratory distress present.     Breath sounds: Decreased breath sounds and wheezing present.     Comments: On CPAP on arrival, placed over on BiPAP.  Speaking in short sentences. Musculoskeletal:     Cervical back: Normal range of motion.     Right lower leg: No edema.     Left lower leg: No edema.  Skin:    General: Skin is warm.     Capillary Refill: Capillary refill takes less than 2 seconds.  Neurological:     Mental Status: He is alert and oriented to person, place, and time.  Psychiatric:        Mood and Affect: Mood normal.        Behavior: Behavior normal.     ED Results / Procedures / Treatments   Labs (all labs ordered are listed, but only abnormal results are displayed) Labs Reviewed  CBC  BASIC METABOLIC PANEL WITH GFR    EKG EKG Interpretation Date/Time:  Friday July 11 2023 07:55:49 EDT Ventricular Rate:  110 PR Interval:  143 QRS Duration:  106 QT Interval:  369 QTC Calculation: 500 R Axis:   81  Text Interpretation: Sinus tachycardia Borderline right axis deviation Borderline ST depression, anterolateral leads Borderline prolonged QT interval Confirmed by Estanislado Pandy [phone]) on 07/11/2023 8:04:53 AM  Radiology DG Chest Portable 1 View Result Date:  07/11/2023 CLINICAL DATA:  Shortness of breath. EXAM: PORTABLE CHEST 1 VIEW COMPARISON:  June 07, 2023. FINDINGS: The heart size and mediastinal contours are within normal limits. Left apical scarring is noted. No acute pulmonary disease is noted. The visualized skeletal structures are unremarkable. IMPRESSION: No active disease. Electronically Signed   By: Lupita Raider M.D.   On: 07/11/2023 08:28    Procedures .Critical Care  Performed by: Coral Spikes, DO Authorized by: Coral Spikes, DO   Critical care provider statement:    Critical care time (minutes):  30   Critical care was necessary to treat or prevent imminent or life-threatening deterioration of the following conditions:  Respiratory failure   Critical care  was time spent personally by me on the following activities:  Development of treatment plan with patient or surrogate, discussions with consultants, evaluation of patient's response to treatment, examination of patient, ordering and review of laboratory studies, ordering and review of radiographic studies, ordering and performing treatments and interventions, pulse oximetry, re-evaluation of patient's condition and review of old charts   Care discussed with: admitting provider       Medications Ordered in ED Medications  albuterol (PROVENTIL) (2.5 MG/3ML) 0.083% nebulizer solution (10 mg/hr Nebulization Given 07/11/23 0814)  ipratropium (ATROVENT) nebulizer solution 0.5 mg (0.5 mg Nebulization Given 07/11/23 0813)  predniSONE (DELTASONE) tablet 60 mg (60 mg Oral Given 07/11/23 0843)  oxyCODONE (Oxy IR/ROXICODONE) immediate release tablet 5 mg (5 mg Oral Given 07/11/23 0843)    ED Course/ Medical Decision Making/ A&P Clinical Course as of 07/11/23 1016  Fri Jul 11, 2023  0838 Patient anxious on BiPAP; requesting to take the mask off.  He does appear comfortable on the BiPAP.  Discussed risk of decompensation and worsening respiratory status.  Patient understands and still  would like to take mask off.  Was taken off and placed on nebulizer mask.  He is refusing an IV at this time.  Will give oral prednisone now that he is off BiPAP.  Will monitor closely. [TY]  0840 DG Chest Portable 1 View IMPRESSION: No active disease.   Electronically Signed   By: Lupita Raider M.D.   On: 07/11/2023 08:28   [TY]  0856 Continues to do well off bipap.  [TY]  [id number] Reports breathing near baseline. Does not appear to be in distress.  [TY]  1014 Patient with significant increase in work of breathing with trying to stand to use bedside commode.  Will admit for COPD exacerbation [TY]    Clinical Course User Index [TY] Coral Spikes, DO                                 Medical Decision Making This is a 63 year old male presenting emergency department with EMS on CPAP for respiratory distress.  History of COPD on baseline 2 to 3 L of oxygen at home.  Largely reassuring vital signs.  Placed on BiPAP on arrival; diminished breath sounds and wheezing throughout.  Patient has been hesitant for IV.  Did not receive steroids prior to arrival.  Per chart review has been seen several times this year for COPD exacerbations here in the emergency department.  If patient will allow IV will get labs, chest x-ray and give Solu-Medrol.  Also asking for pain medications for his right hip, chronic pain.  Per review does take oxycodone's for his pain.  Given patient's degree of respiratory distress on BiPAP will likely need admission.  See ED course for final MDM disposition.  Amount and/or Complexity of Data Reviewed External Data Reviewed:     Details: I.  Patient has refused labs in the past secondary to needle phobia Labs: ordered.    Details: Patient refused IV and therefore unable to obtain labs.  Has capacity and understands that getting labs could be limiting his care. Radiology: ordered. Decision-making details documented in ED Course. ECG/medicine tests: ordered. Discussion of  management or test interpretation with external provider(s): hospitalist  Risk Prescription drug management. Decision regarding hospitalization. Diagnosis or treatment significantly limited by social determinants of health.          Final Clinical Impression(s) /  ED Diagnoses Final diagnoses:  COPD exacerbation [facility])    Rx / DC Orders ED Discharge Orders     None         Coral Spikes, DO 07/11/23 1016    Coral Spikes, DO 07/11/23 1016

## 2023-07-11 NOTE — Progress Notes (Signed)
 Patient not tolerating Bipap mask. Patient switched to a CAT aerosol mask to continue the nebulized breathing treatment. MD and RN notified. Patient has mild WOB at this time. MD at bedside and aware.

## 2023-07-11 NOTE — ED Notes (Signed)
 Notified Dr Maple Hudson of pt's hypotension. Pt refuses IV, so no new orders at this time.

## 2023-07-11 NOTE — H&P (Addendum)
 History and Physical    Patient: Grant Galvan ZOX:[id number] DOB: 17-Sep-1960 DOA: 07/11/2023 DOS: the patient was seen and examined on 07/11/2023 PCP: Copland, Gwenlyn Found, MD  Patient coming from: Home  Chief Complaint:  Chief Complaint  Patient presents with   Shortness of Breath   HPI: Grant Galvan is a 63 y.o. male with medical history significant of hypertension, hyperlipidemia, COPD, chronic respiratory failure on 2 to 3 L, and chronic pain who presents with complaints of shortness of breath.  He has been experiencing worsening breathing difficulties over the past three to four days, describing a sensation of 'mess building up in my lungs.' He typically uses two to three liters of oxygen at home and has been using his breathing treatments more frequently than the prescribed four times a day due to shortness of breath. An antibiotic was prescribed, which his wife picked up this morning. No recent fevers are reported.  He has had a productive cough with yellowish sputum production.  He has a history of anxiety and panic attacks, which exacerbate his breathing difficulties. He was previously given medication for anxiety during a past visit, but his regular doctor did not continue this prescription.  He reports occasional swelling in his feet, which he manages with a diuretic as needed. He took a diuretic on Monday when he noticed swelling.  In terms of social history, he has recently quit smoking, having previously smoked one or two cigarettes a day. He does not consume alcohol. He has a strong aversion to needles, recounting a recent unsuccessful attempt by a paramedic to draw blood.   In the emergency department patient was noted to be afebrile with pulse elevated up to 110, respirations 12-24, blood pressures 88/69 to 101/81, and O2 saturations currently maintained on 3 L of nasal cannula oxygen.  Patient had been temporarily placed on BiPAP.  Patient declined all labs due to fear of  needles.  Patient has been given prednisone 60 mg p.o., oxycodone 5 mg p.o., ipratropium nebs, continuous albuterol breathing treatment.  Review of Systems: As mentioned in the history of present illness. All other systems reviewed and are negative. Past Medical History:  Diagnosis Date   COPD (chronic obstructive pulmonary disease) (HCC)    Degenerative disc disease    reports back fx x 3.   Past Surgical History:  Procedure Laterality Date   BACK SURGERY     Social History:  reports that he has been smoking cigarettes. He has quit using smokeless tobacco. He reports that he does not currently use drugs. He reports that he does not drink alcohol.  Allergies  Allergen Reactions   Celecoxib Other (See Comments)    Stopped breathing   Cyclobenzaprine Hcl Hives   Tramadol Diarrhea    Family History  Problem Relation Age of Onset   Cancer Mother    Stroke Mother    COPD Father    Cancer Father 52       prostate   Cancer Brother 33       colon    Prior to Admission medications   Medication Sig Start Date End Date Taking? Authorizing Provider  albuterol (VENTOLIN HFA) 108 (90 Base) MCG/ACT inhaler Inhale 2 puffs into the lungs every 6 (six) hours as needed for wheezing or shortness of breath. 05/12/23   Copland, Gwenlyn Found, MD  ALPRAZolam Prudy Feeler) 0.5 MG tablet Take 1 tablet (0.5 mg total) by mouth daily as needed for anxiety. 06/07/23   Jacalyn Lefevre, MD  amLODipine (NORVASC) 10 MG tablet Take 1 tablet (10 mg total) by mouth daily. 04/10/23   Copland, Gwenlyn Found, MD  amoxicillin-clavulanate (AUGMENTIN) 875-125 MG tablet Take 1 tablet by mouth every 12 (twelve) hours. 06/07/23   Jacalyn Lefevre, MD  amoxicillin-clavulanate (AUGMENTIN) 875-125 MG tablet Take 1 tablet by mouth 2 (two) times daily. 07/09/23   Copland, Gwenlyn Found, MD  clotrimazole-betamethasone (LOTRISONE) cream Apply 1 Application topically 2 (two) times daily. 07/07/23   Copland, Gwenlyn Found, MD  DULoxetine (CYMBALTA) 60 MG  capsule Take 1 capsule (60 mg total) by mouth daily. 09/16/22   Copland, Gwenlyn Found, MD  fluticasone-salmeterol (ADVAIR) 100-50 MCG/ACT AEPB Inhale 1 puff into the lungs 2 (two) times daily. 06/03/23   Copland, Gwenlyn Found, MD  gabapentin (NEURONTIN) 300 MG capsule Take 1 capsule (300 mg total) by mouth 3 (three) times daily. 01/22/22   Copland, Gwenlyn Found, MD  hydrochlorothiazide (HYDRODIURIL) 25 MG tablet Take 1 tablet (25 mg total) by mouth daily. Use as needed for swelling 09/04/22   Copland, Gwenlyn Found, MD  ibuprofen (ADVIL) 800 MG tablet Take 1 tablet (800 mg total) by mouth 3 (three) times daily. 03/28/22   Pricilla Loveless, MD  ipratropium-albuterol (DUONEB) 0.5-2.5 (3) MG/3ML SOLN Take 3 mLs by nebulization every 6 (six) hours as needed. 11/12/22   Copland, Gwenlyn Found, MD  Methocarbamol 1000 MG TABS Take 1,000 mg by mouth every 8 (eight) hours as needed. 03/30/23   Rondel Baton, MD  Misc. Devices KIT 1 each by Does not apply route daily. Mobility Chair 05/12/23   Copland, Gwenlyn Found, MD  nicotine (NICODERM CQ - DOSED IN MG/24 HOURS) 21 mg/24hr patch Place 1 patch (21 mg total) onto the skin daily. 06/07/23   Jacalyn Lefevre, MD  oxyCODONE (OXY IR/ROXICODONE) 5 MG immediate release tablet TAKE ONE-HALF TO ONE TABLET (2.5 TO 5MG  TOTAL) BY MOUTH EVERY 6 HOURS AS NEEDED FOR SEVERE PAIN 06/03/23   Copland, Gwenlyn Found, MD  oxyCODONE (ROXICODONE) 5 MG immediate release tablet Take 0.5-1 tablets (2.5-5 mg total) by mouth every 6 (six) hours as needed for severe pain (pain score 7-10). 07/07/23   Copland, Gwenlyn Found, MD  predniSONE (DELTASONE) 50 MG tablet Take 1 tablet (50 mg total) by mouth daily with breakfast. 06/07/23   Jacalyn Lefevre, MD  Respiratory Therapy Supplies (NEBULIZER/TUBING/MOUTHPIECE) KIT 1 each by Does not apply route as directed. 08/28/22   Copland, Gwenlyn Found, MD  rosuvastatin (CRESTOR) 10 MG tablet Take 1 tablet (10 mg total) by mouth daily. 01/28/23   Copland, Gwenlyn Found, MD  triamcinolone cream  (KENALOG) 0.5 % Apply topically 2 (two) times daily. Use as needed for rash on extremities 07/07/23   Copland, Gwenlyn Found, MD  umeclidinium bromide (INCRUSE ELLIPTA) 62.5 MCG/ACT AEPB Inhale 1 puff into the lungs daily. 06/03/23   CoplandGwenlyn Found, MD    Physical Exam: Vitals:   07/11/23 0915 07/11/23 0940 07/11/23 1000 07/11/23 1030  BP: (!) 88/69 92/68 90/70  90/69  Pulse: 100 (!) 105 (!) 102 (!) 103  Resp: 16 19 13 13   Temp:      TempSrc:      SpO2: 100% 100% 100% 100%  Weight:      Height:       Constitutional: Older adult male currently in no acute distress Eyes: PERRL, lids and conjunctivae normal ENMT: Mucous membranes are moist.  Poor dentition. Neck: normal, supple,   Respiratory: Mildly decreased aeration with expiratory wheezes appreciated in both lung fields. Cardiovascular: Mildly  tachycardic. No extremity edema. 2+ pedal pulses. No carotid bruits.  Abdomen: no tenderness, no masses palpated. Bowel sounds positive.  Musculoskeletal: no clubbing / cyanosis. No joint deformity upper and lower extremities. Good ROM, no contractures. Normal muscle tone.  Skin: no rashes, lesions, ulcers. No induration Neurologic: CN 2-12 grossly intact. Strength 5/5 in all 4.  Psychiatric: Normal judgment and insight. Alert and oriented x 3. Normal mood.   Data Reviewed:  EKG revealed sinus tachycardia 110 bpm with right axis deviation and QT prolonged at 500. Reviewed patient's previous records.  Assessment and Plan:  Acute on chronic respiratory failure with hypoxia COPD exacerbation Patient presented with worsening shortness of breath and cough.  Chest x-ray showed no acute abnormality.  On physical exam patient with expiratory wheezes appreciated.  Patient was temporarily placed on BiPAP, but able to be weaned to nasal cannula oxygen currently on 4 L.  Normally patient on 2 to 3 L.  Chest x-ray noted no acute abnormality.  Patient was given breathing treatments and prednisone 60 mg p.o.  as he declined Solu-Medrol IV due to fear of needles. - Admit to a medical telemetry bed - Continuous pulse oximetry with nasal cannula oxygen to maintain O2 saturation greater than 90%. -Incentive spirometry and flutter valve - DuoNebs 4 times daily and albuterol nebs as needed for shortness of breath/wheezing - Prolonged time budesonide nebs twice daily - Prednisone 60 mg daily - Continue Augmentin - Mucinex  Trypanophobia Patient reports fear of needles for which he declined any labs.  Essential hypertension Initial blood pressures noted to be soft 88/69 to 101/81.  Patient had taken amlodipine already this morning. - Continue amlodipine with holding parameters.  Chronic pain - Continue oxycodone as needed for pain  Hyperlipidemia - Continue atorvastatin  Tobacco abuse Patient notes still smoking 1-2 cigarettes per day.  - Continue to counseled the patient on need of cessation of tobacco use.   DVT prophylaxis: SCDs.  Due to patient's fear of needles  Advance Care Planning:   Code Status: Full Code    Consults: None  Family Communication: Wife updated over the phone. Severity of Illness: The appropriate patient status for this patient is OBSERVATION. Observation status is judged to be reasonable and necessary in order to provide the required intensity of service to ensure the patient's safety. The patient's presenting symptoms, physical exam findings, and initial radiographic and laboratory data in the context of their medical condition is felt to place them at decreased risk for further clinical deterioration. Furthermore, it is anticipated that the patient will be medically stable for discharge from the hospital within 2 midnights of admission.   Author: Clydie Braun, MD 07/11/2023 10:56 AM  For on call review [email or web address].

## 2023-07-12 DIAGNOSIS — J441 Chronic obstructive pulmonary disease with (acute) exacerbation: Secondary | ICD-10-CM | POA: Diagnosis not present

## 2023-07-12 MED ORDER — FLUTICASONE FUROATE-VILANTEROL 100-25 MCG/ACT IN AEPB
1.0000 | INHALATION_SPRAY | Freq: Every day | RESPIRATORY_TRACT | Status: DC
  Administered 2023-07-12 – 2023-07-14 (×3): 1 via RESPIRATORY_TRACT
  Filled 2023-07-12: qty 28

## 2023-07-12 MED ORDER — UMECLIDINIUM BROMIDE 62.5 MCG/ACT IN AEPB
1.0000 | INHALATION_SPRAY | Freq: Every day | RESPIRATORY_TRACT | Status: DC
  Administered 2023-07-12 – 2023-07-14 (×3): 1 via RESPIRATORY_TRACT
  Filled 2023-07-12: qty 7

## 2023-07-12 MED ORDER — VARENICLINE TARTRATE 0.5 MG PO TABS
0.5000 mg | ORAL_TABLET | Freq: Two times a day (BID) | ORAL | Status: DC
  Administered 2023-07-12 – 2023-07-14 (×5): 0.5 mg via ORAL
  Filled 2023-07-12 (×6): qty 1

## 2023-07-12 MED ORDER — IPRATROPIUM-ALBUTEROL 0.5-2.5 (3) MG/3ML IN SOLN
3.0000 mL | Freq: Four times a day (QID) | RESPIRATORY_TRACT | Status: DC | PRN
  Administered 2023-07-14: 3 mL via RESPIRATORY_TRACT
  Filled 2023-07-12: qty 3

## 2023-07-12 NOTE — Plan of Care (Signed)
  Problem: Education: Goal: Knowledge of General Education information will improve Description: Including pain rating scale, medication(s)/side effects and non-pharmacologic comfort measures Outcome: Progressing   Problem: Clinical Measurements: Goal: Respiratory complications will improve Outcome: Progressing   Problem: Clinical Measurements: Goal: Will remain free from infection Outcome: Progressing   Problem: Clinical Measurements: Goal: Ability to maintain clinical measurements within normal limits will improve Outcome: Progressing   Problem: Clinical Measurements: Goal: Respiratory complications will improve Outcome: Progressing   Problem: Clinical Measurements: Goal: Cardiovascular complication will be avoided Outcome: Progressing   Problem: Activity: Goal: Risk for activity intolerance will decrease Outcome: Progressing   Problem: Coping: Goal: Level of anxiety will decrease Outcome: Progressing

## 2023-07-12 NOTE — Progress Notes (Signed)
 [facility] ROUNDING NOTE Grant Galvan ZOX:[id number]  DOB: Feb 28, 1961  DOA: 07/11/2023  PCP: Kaylee Partridge, MD  07/12/2023,7:42 AM  LOS: 0 days    Code Status: Full code   from: Home current Dispo: Unclear   63 year old white male Known underlying COPD with continued tobacco use at baseline on 3 L oxygen Chronic back pain with moderate to severe anxiety Severe phobia of needles  Recent ED visit 06/07/2023 for shortness of breath COPD exacerbation  Brought to emergency room with worsening shortness of breath-noted to have in ED pulse 110 respirations low 20s blood pressures slightly low in the 88 range Patient declined all labs--was placed temporarily on BiPAP CXR showed no active disease, EKG showed sinus tach PR interval 0.12 QRS axis 80 degrees early repolarization no ST-T wave damage injury  Plan  Acute COPD exacerbation Now at baseline oxygenation-significant anxiety overlay X-ray on admission shows no active disease Continuing prednisone 60 for now, albuterol Will complete Augmentin as an outpatient 3 to 4 days total  Chronic anxiety Has trialed meds in the past did not agree with him Seems to like Xanax 0.5 twice daily we will give him a short prescription of this at discharge he will need to follow-up with Dr. Allison Arena for refills or alternate  Continued smoker Start Chantix as intolerant to gum and patches Reevaluate depending on how he does  DVT prophylaxis: Lovenox  Status is: Observation The patient remains OBS appropriate and will d/c before 2 midnights.      Subjective: Quite anxious asking for Xanax --seems to help Seems comfortable otherwise chest pain walked in room and felt very winded  Objective + exam Vitals:   07/11/23 1700 07/11/23 1950 07/11/23 1952 07/12/23 0428  BP: 98/64  102/74 110/74  Pulse: 95  85 69  Resp: 17     Temp: 98.4 F (36.9 C)  97.8 F (36.6 C) 97.6 F (36.4 C)  TempSrc: Oral  Oral Oral  SpO2: 100% 100% 98% 100%  Weight:       Height:       Filed Weights   07/11/23 0752  Weight: 80 kg    Examination: No icterus no pallor no wheeze no rales no rhonchi ROM intact Chest clear no added sound CTAB Power 5/5 Wrenn S2 no murmur rub or gallop  Data Reviewed: reviewed   CBC No results found for: "WBC", "RBC", "HGB", "HCT", "PLT", "MCV", "MCH", "MCHC", "RDW", "LYMPHSABS", "MONOABS", "EOSABS", "BASOSABS"     No data to display          Scheduled Meds:  amLODipine  10 mg Oral Daily   amoxicillin-clavulanate  1 tablet Oral BID   arformoterol  15 mcg Nebulization BID   budesonide (PULMICORT) nebulizer solution  0.5 mg Nebulization BID   clotrimazole-betamethasone  1 Application Topical BID   enoxaparin (LOVENOX) injection  40 mg Subcutaneous Q24H   guaiFENesin  600 mg Oral BID   ipratropium-albuterol  3 mL Nebulization Q6H   predniSONE  60 mg Oral Q breakfast   rosuvastatin  10 mg Oral Daily   sodium chloride flush  3 mL Intravenous Q12H   triamcinolone cream   Topical BID   Continuous Infusions:  Time  26  Jai-Gurmukh Natash Berman, MD  [facility]

## 2023-07-12 NOTE — Evaluation (Signed)
 Physical Therapy Evaluation Patient Details Name: Grant Galvan MRN: [id number] DOB: 1961-01-07 Today's Date: 07/12/2023  History of Present Illness  Pt is a 63 y.o. male presenting 07/11/23 with a 3-4 day hx of worsening SOB. Admitted with COPD exacerbation. Imaging unremarkable. PMH HTN, HLD, chronic respiratory failure on 2-3 L, chronic pain, anxiety, panic attacks, fear of needles  Clinical Impression  Pt is a 63 y.o. male presenting on 07/11/23 with above conditions and deficits below, see PT Problem List. PTA pt independently lived in a mobile home with his wife, and had 4 STE. Currently he is mod I for bed mobility and CGA for ambulation and transfers and reports being below his functional baseline. Pt displays deficits in transfers, gait, balance, anxiety, SOB, and activity tolerance and would benefit from continued acute PT. After getting to the EOB, the pt had a severe increase of SOB and reported anxiety of not being able to breath. He sat EOB for around 15 mins performing pursed lip breathing before he was able to continue and take a few steps to the recliner. Given the pt's current condition, available family support, and motivation he would benefit from HHPT upon d/c to promote return to functional independence. Pt would benefit from continued mobility with nurses and mobility specialists until d/c. Will continue to follow acutely.           If plan is discharge home, recommend the following: A little help with walking and/or transfers;A little help with bathing/dressing/bathroom;Assistance with cooking/housework;Assist for transportation;Help with stairs or ramp for entrance   Can travel by private vehicle        Equipment Recommendations None recommended by PT  Recommendations for Other Services       Functional Status Assessment Patient has had a recent decline in their functional status and demonstrates the ability to make significant improvements in function in a reasonable  and predictable amount of time.     Precautions / Restrictions Precautions Precautions: Fall Recall of Precautions/Restrictions: Intact Restrictions Weight Bearing Restrictions Per Provider Order: No      Mobility  Bed Mobility Overal bed mobility: Modified Independent             General bed mobility comments: pt requires HOB elevation and uses momentum to pop himself out to the side of the bed to which he immediately has an increase in SOB    Transfers Overall transfer level: Needs assistance Equipment used: None Transfers: Sit to/from Stand Sit to Stand: Contact guard assist           General transfer comment: pt requires CGA for safety  and needs to stand still for 20-30 secs to catch his breath before continuing.    Ambulation/Gait Ambulation/Gait assistance: Contact guard assist Gait Distance (Feet): 4 Feet Assistive device: None Gait Pattern/deviations: Step-through pattern, Decreased stride length, Wide base of support Gait velocity: reduced Gait velocity interpretation: <1.8 ft/sec, indicate of risk for recurrent falls   General Gait Details: pt walks with increased BOS and out-toeing and reaches for objects in the environment for additional balance when going to the chair.  Stairs            Wheelchair Mobility     Tilt Bed    Modified Rankin (Stroke Patients Only)       Balance Overall balance assessment: Needs assistance Sitting-balance support: Bilateral upper extremity supported, No upper extremity supported, Feet supported Sitting balance-Leahy Scale: Good Sitting balance - Comments: pt primarly sits with bil UEs supported s/t  increased SOB from bed mobility and COPD. Pt sits with increased trunk flexion and barreled chest and performs pursed lip breathing for 15 mins. Postural control: Other (comment) (anterior when sitting) Standing balance support: No upper extremity supported, Single extremity supported Standing balance-Leahy  Scale: Fair Standing balance comment: pt stands with no UE support. Has slight A/P sway and increased BOS                             Pertinent Vitals/Pain Pain Assessment Pain Assessment: No/denies pain    Home Living Family/patient expects to be discharged to:: Private residence Living Arrangements: Spouse/significant other Available Help at Discharge: Family;Available 24 hours/day Type of Home: Mobile home Home Access: Stairs to enter Entrance Stairs-Rails: Left;Right;Can reach both Entrance Stairs-Number of Steps: 4   Home Layout: One level Home Equipment: Agricultural consultant (2 wheels);Rollator (4 wheels);Cane - single point;Other (comment) (in the progress of getting an Art gallery manager)      Prior Function Prior Level of Function : Independent/Modified Independent;Driving             Mobility Comments: intermittently uses a SPC or RW/rollator depending on how he is feeling. Tries to not use any AD if able ADLs Comments: retired.     Extremity/Trunk Assessment   Upper Extremity Assessment Upper Extremity Assessment: Overall WFL for tasks assessed    Lower Extremity Assessment Lower Extremity Assessment: Generalized weakness    Cervical / Trunk Assessment Cervical / Trunk Assessment: Kyphotic  Communication   Communication Communication: No apparent difficulties    Cognition Arousal: Alert Behavior During Therapy: WFL for tasks assessed/performed   PT - Cognitive impairments: No apparent impairments                         Following commands: Intact       Cueing Cueing Techniques: Verbal cues     General Comments General comments (skin integrity, edema, etc.): When off O2, pt's SpO2 remained >93% at rest. After the pt transferred to the EOB his SOB increased and he had difficulty regaining his breath. He was able to sit EOB for 5 mins without O2 before he was put back onto O2    Exercises     Assessment/Plan    PT Assessment  Patient needs continued PT services  PT Problem List Decreased strength;Decreased range of motion;Decreased activity tolerance;Decreased balance;Decreased mobility;Decreased coordination;Decreased knowledge of use of DME;Cardiopulmonary status limiting activity       PT Treatment Interventions DME instruction;Gait training;Stair training;Functional mobility training;Therapeutic exercise;Therapeutic activities;Balance training;Neuromuscular re-education;Patient/family education;Wheelchair mobility training    PT Goals (Current goals can be found in the Care Plan section)  Acute Rehab PT Goals Patient Stated Goal: improve PT Goal Formulation: With patient Time For Goal Achievement: 07/26/23 Potential to Achieve Goals: Good Additional Goals Additional Goal #1: Pt will score >19/24 on the DGI    Frequency Min 2X/week     Co-evaluation               AM-PAC PT "6 Clicks" Mobility  Outcome Measure Help needed turning from your back to your side while in a flat bed without using bedrails?: None Help needed moving from lying on your back to sitting on the side of a flat bed without using bedrails?: None Help needed moving to and from a bed to a chair (including a wheelchair)?: A Little Help needed standing up from a chair using your arms (e.g.,  wheelchair or bedside chair)?: A Little Help needed to walk in hospital room?: Total (<20 ft) Help needed climbing 3-5 steps with a railing? : Total 6 Click Score: 16    End of Session Equipment Utilized During Treatment: Gait belt;Oxygen Activity Tolerance: Treatment limited secondary to medical complications (Comment) (increased SOB) Patient left: in chair;with call bell/phone within reach Nurse Communication: Mobility status;Other (comment) (nurse reported pt doesn't require an active bed alarm) PT Visit Diagnosis: Unsteadiness on feet (R26.81);Other abnormalities of gait and mobility (R26.89);Muscle weakness (generalized) (M62.81)     Time: 4098-1191 PT Time Calculation (min) (ACUTE ONLY): 39 min   Charges:   PT Evaluation $PT Eval Moderate Complexity: 1 Mod   PT General Charges $$ ACUTE PT VISIT: 1 Visit         [address], SPT   [place] 07/12/2023, 10:55 AM

## 2023-07-12 NOTE — Care Management Obs Status (Signed)
 MEDICARE OBSERVATION STATUS NOTIFICATION   Patient Details  Name: Grant Galvan MRN: [id number] Date of Birth: November 27, 1960   Medicare Observation Status Notification Given:  Yes    Jannine Meo, RN 07/12/2023, 1:36 PM

## 2023-07-13 DIAGNOSIS — J441 Chronic obstructive pulmonary disease with (acute) exacerbation: Secondary | ICD-10-CM | POA: Diagnosis not present

## 2023-07-13 MED ORDER — IPRATROPIUM-ALBUTEROL 0.5-2.5 (3) MG/3ML IN SOLN
3.0000 mL | Freq: Two times a day (BID) | RESPIRATORY_TRACT | Status: DC
  Administered 2023-07-14: 3 mL via RESPIRATORY_TRACT
  Filled 2023-07-13: qty 3

## 2023-07-13 MED ORDER — ALUM & MAG HYDROXIDE-SIMETH 200-200-20 MG/5ML PO SUSP: 15.0000 mL | ORAL | Status: DC | PRN

## 2023-07-13 NOTE — Progress Notes (Signed)
 Pt refused Lovenox inj. Stated he does not take needles

## 2023-07-13 NOTE — Plan of Care (Signed)
  Problem: Clinical Measurements: Goal: Ability to maintain clinical measurements within normal limits will improve Outcome: Progressing   Problem: Health Behavior/Discharge Planning: Goal: Ability to manage health-related needs will improve Outcome: Progressing   Problem: Education: Goal: Knowledge of General Education information will improve Description: Including pain rating scale, medication(s)/side effects and non-pharmacologic comfort measures Outcome: Progressing   Problem: Clinical Measurements: Goal: Diagnostic test results will improve Outcome: Progressing   Problem: Clinical Measurements: Goal: Respiratory complications will improve Outcome: Progressing

## 2023-07-13 NOTE — Progress Notes (Signed)
 TRH ROUNDING NOTE Grant Galvan:[id number]  DOB: 03-25-1961  DOA: 07/11/2023  PCP: Kaylee Partridge, MD  07/13/2023,10:35 AM  LOS: 0 days    Code Status: Full code   from: Home current Dispo: Unclear   63 year old white male Known underlying COPD with continued tobacco use at baseline on 3 L oxygen Chronic back pain with moderate to severe anxiety Severe phobia of needles  Recent ED visit 06/07/2023 for shortness of breath COPD exacerbation  Brought to emergency room with worsening shortness of breath-noted to have in ED pulse 110 respirations low 20s blood pressures slightly low in the 88 range Patient declined all labs--was placed temporarily on BiPAP CXR showed no active disease, EKG showed sinus tach PR interval 0.12 QRS axis 80 degrees early repolarization no ST-T wave damage pattern  Plan  Acute COPD exacerbation baseline 3 L at home Explained to patient need to keep sats between 88 and 92-for now on prednisone 60 daily, albuterol every 2 as needed Breo Ellipta with Incruse Ellipta and Mucinex 600 twice daily Expect if continues to improve likely can discharge in a.m.  Chronic anxiety States preference for Xanax 0.5 twice daily-small prescription to be given-has not been able to take other multiple meds in the outpatient setting previously  Continued smoker-Chantix started and will uptitrate going forward  HTN Blood pressure normal to low normal at times-I would discontinue HCTZ as patient declines all needlesticks Periodic labs when able  DVT prophylaxis: Lovenox  Status is: Observation The patient remains OBS appropriate and will d/c before 2 midnights.      Subjective:  Still SOB some sputum declines labs Still feels a little short of breath but is marginally improved since yesterday no chest pain  Objective + exam Vitals:   07/12/23 2024 07/13/23 0410 07/13/23 0811 07/13/23 0829  BP:  105/72 119/62 139/85  Pulse:  70  79  Resp:  17  18  Temp:  97.6 F  (36.4 C)  98.5 F (36.9 C)  TempSrc:  Oral  Oral  SpO2: 95% 99%  100%  Weight:      Height:       Filed Weights   07/11/23 0752  Weight: 80 kg    Examination:  No icterus no pallor looks a little bit more comfortable Chest less wheeze no rales rhonchi ROM intact Abdomen soft no rebound Power 5/5 S1-S2 no murmur  Data Reviewed: reviewed   CBC No results found for: "WBC", "RBC", "HGB", "HCT", "PLT", "MCV", "MCH", "MCHC", "RDW", "LYMPHSABS", "MONOABS", "EOSABS", "BASOSABS"     No data to display          Scheduled Meds:  amLODipine  10 mg Oral Daily   amoxicillin-clavulanate  1 tablet Oral BID   clotrimazole-betamethasone  1 Application Topical BID   enoxaparin (LOVENOX) injection  40 mg Subcutaneous Q24H   fluticasone furoate-vilanterol  1 puff Inhalation Daily   guaiFENesin  600 mg Oral BID   ipratropium-albuterol  3 mL Nebulization Q6H   predniSONE  60 mg Oral Q breakfast   rosuvastatin  10 mg Oral Daily   sodium chloride flush  3 mL Intravenous Q12H   triamcinolone cream   Topical BID   umeclidinium bromide  1 puff Inhalation Daily   varenicline  0.5 mg Oral BID   Continuous Infusions:  Time  26  Jai-Gurmukh Maranatha Grossi, MD  [facility]

## 2023-07-13 NOTE — Progress Notes (Signed)
 Pt refused labs. Stated he doesn't like needles

## 2023-07-13 NOTE — Progress Notes (Signed)
 Tele box 6 north 9 taken off pt and returned back to drawer at nurses station

## 2023-07-14 ENCOUNTER — Other Ambulatory Visit (HOSPITAL_COMMUNITY): Payer: Self-pay

## 2023-07-14 DIAGNOSIS — J441 Chronic obstructive pulmonary disease with (acute) exacerbation: Secondary | ICD-10-CM | POA: Diagnosis not present

## 2023-07-14 MED ORDER — FLUTICASONE FUROATE-VILANTEROL 100-25 MCG/ACT IN AEPB
1.0000 | INHALATION_SPRAY | Freq: Every day | RESPIRATORY_TRACT | 3 refills | Status: DC
  Filled 2023-07-14: qty 60, 30d supply, fill #0

## 2023-07-14 MED ORDER — ALPRAZOLAM 0.5 MG PO TABS
0.5000 mg | ORAL_TABLET | Freq: Two times a day (BID) | ORAL | 0 refills | Status: DC | PRN
  Filled 2023-07-14: qty 12, 6d supply, fill #0

## 2023-07-14 MED ORDER — PREDNISONE 20 MG PO TABS
60.0000 mg | ORAL_TABLET | Freq: Every day | ORAL | 0 refills | Status: DC
  Filled 2023-07-14: qty 4, 1d supply, fill #0

## 2023-07-14 MED ORDER — GUAIFENESIN ER 600 MG PO TB12
600.0000 mg | ORAL_TABLET | Freq: Two times a day (BID) | ORAL | 0 refills | Status: AC
  Filled 2023-07-14: qty 60, 30d supply, fill #0

## 2023-07-14 MED ORDER — VARENICLINE TARTRATE 1 MG PO TABS
ORAL_TABLET | ORAL | 0 refills | Status: AC
Start: 2023-07-14 — End: 2023-09-22
  Filled 2023-07-14: qty 138, 70d supply, fill #0

## 2023-07-14 NOTE — Discharge Summary (Signed)
 Physician Discharge Summary  Grant Galvan QIO:[id number] DOB: 04-09-1960 DOA: 07/11/2023  PCP: Pearline Cables, MD  Admit date: 07/11/2023 Discharge date: 07/14/2023  Time spent: 43 minutes  Recommendations for Outpatient Follow-up:  Get x-ray 1 month, screening CT scan lungs as per protocol for smoker Limited prescription Xanax given-needs outpatient discussion about alternative agents not a good long-term choice Requires Chantix titration in the outpatient setting and goal setting with regards to cessation Note changes of inhalers to switch to [facility] etc. this hospital stay Complete steroid course as outpatient  Discharge Diagnoses:  MAIN problem for hospitalization   Acute COPD exacerbation with underlying anxiety  Please see below for itemized issues addressed in HOpsital- refer to other progress notes for clarity if needed  Discharge Condition: Improved  Diet recommendation: Heart healthy  Filed Weights   07/11/23 0752  Weight: 80 kg    History of present illness:  63 year old white male Known underlying COPD with continued tobacco use at baseline on 3 L oxygen Chronic back pain with moderate to severe anxiety Severe phobia of needles   Recent ED visit 06/07/2023 for shortness of breath COPD exacerbation   Brought to emergency room with worsening shortness of breath-noted to have in ED pulse 110 respirations low 20s blood pressures slightly low in the 88 range Patient declined all labs--was placed temporarily on BiPAP CXR showed no active disease, EKG showed sinus tach PR interval 0.12 QRS axis 80 degrees early repolarization no ST-T wave damage pattern   Plan   Acute COPD exacerbation baseline 3 L at home O2's sats to be maintained between 88 and 92-patient tapered off Solu-Medrol and now on prednisone to complete course As no sputum wheeze-would hold antibiotics Really needs to quit smoking-initiated Chantix without side effects was able to tolerate will  call in 12-week prescription with titration instructions in the outpatient setting Replaced this hospital stay some of his inhalers with Incruse and Breo Ellipta he has other inhalers etc. at home   Chronic anxiety States preference for Xanax 0.5 twice daily-small prescription to be given-has not been able to take other multiple meds in the outpatient setting previously Suggest outpatient discussion about alternatives going forward   Continued smoker-see above discussion   HTN Blood pressure normal to low normal at times- patient states that he takes HCTZ only periodically with lower extremity swelling I have encouraged him to stop the same as he does not get labs  Discharge Exam: Vitals:   07/14/23 0748 07/14/23 0817  BP:  130/73  Pulse:  77  Resp:  17  Temp:    SpO2: 96% 100%    Subj on day of d/c   Overall  is improved no distress feels well no chest pain no nausea no vomiting EOMI NCAT no focal deficit no icterus no wheeze no rales or rhonchi No lower extremity edema Abdomen is soft no rebound no guarding ROM is intact  Discharge Instructions   Discharge Instructions     Diet - low sodium heart healthy   Complete by: As directed    Discharge instructions   Complete by: As directed    You were diagnosed with a COPD exacerbation this hospital stay and we gave you some steroids and you should complete the course of the therapy as we have described with prednisone--- because you were not really producing sputum and seemed quite anxious we held antibiotics and we have given you very limited prescription of Xanax-we cannot fill beyond the limited prescription that  you have been given so follow-up with your primary physician for outpatient management and other recommendations to treat your anxiety We started Incruse elliptica/Breo Ellipta to help you with your breathing and we will give you some Mucinex for your cough Take the Chantix as described there are different doses you  should pick a stop date within 12 weeks and stop completely smoking I would not recommend using hydrochlorothiazide without labs please consult with your primary physician and get labs at your convenience For high-grade fever chills nausea vomiting chest pain cough cold etc. please come to the emergency room again   Increase activity slowly   Complete by: As directed       Allergies as of 07/14/2023       Reactions   Celebrex [celecoxib] Other (See Comments)   Stopped breathing   Flexeril [cyclobenzaprine] Hives   Tylenol [acetaminophen] Other (See Comments)   Liver function   Ultram [tramadol] Diarrhea        Medication List     STOP taking these medications    amoxicillin-clavulanate 875-125 MG tablet Commonly known as: AUGMENTIN   fluticasone-salmeterol 100-50 MCG/ACT Aepb Commonly known as: ADVAIR Replaced by: fluticasone furoate-vilanterol 100-25 MCG/ACT Aepb   hydrochlorothiazide 25 MG tablet Commonly known as: HYDRODIURIL       TAKE these medications    albuterol 108 (90 Base) MCG/ACT inhaler Commonly known as: VENTOLIN HFA Inhale 2 puffs into the lungs every 6 (six) hours as needed for wheezing or shortness of breath.   ALPRAZolam 0.5 MG tablet Commonly known as: XANAX Take 1 tablet (0.5 mg total) by mouth 2 (two) times daily as needed for anxiety.   amLODipine 10 MG tablet Commonly known as: NORVASC Take 1 tablet (10 mg total) by mouth daily.   clotrimazole-betamethasone cream Commonly known as: LOTRISONE Apply 1 Application topically 2 (two) times daily.   fluticasone furoate-vilanterol 100-25 MCG/ACT Aepb Commonly known as: BREO ELLIPTA Inhale 1 puff into the lungs daily. Start taking on: July 15, 2023 Replaces: fluticasone-salmeterol 100-50 MCG/ACT Aepb   guaiFENesin 600 MG 12 hr tablet Commonly known as: MUCINEX Take 1 tablet (600 mg total) by mouth 2 (two) times daily.   Incruse Ellipta 62.5 MCG/ACT Aepb Generic drug: umeclidinium  bromide Inhale 1 puff into the lungs daily.   ipratropium-albuterol 0.5-2.5 (3) MG/3ML Soln Commonly known as: DUONEB Take 3 mLs by nebulization every 6 (six) hours as needed. What changed: when to take this   oxyCODONE 5 MG immediate release tablet Commonly known as: Roxicodone Take 0.5-1 tablets (2.5-5 mg total) by mouth every 6 (six) hours as needed for severe pain (pain score 7-10). What changed: how much to take   predniSONE 20 MG tablet Commonly known as: DELTASONE Take 3 tablets (60 mg total) by mouth daily with breakfast. Start taking on: July 15, 2023   rosuvastatin 10 MG tablet Commonly known as: Crestor Take 1 tablet (10 mg total) by mouth daily.   triamcinolone cream 0.5 % Commonly known as: KENALOG Apply topically 2 (two) times daily. Use as needed for rash on extremities   varenicline 1 MG tablet Commonly known as: CHANTIX Take 0.5 tablets (0.5 mg total) by mouth 2 (two) times daily for 2 days, THEN 1 tablet (1 mg total) 2 (two) times daily. Start taking on: July 14, 2023       Allergies  Allergen Reactions   Celebrex [Celecoxib] Other (See Comments)    Stopped breathing   Flexeril [Cyclobenzaprine] Hives   Tylenol [Acetaminophen] Other (  See Comments)    Liver function   Ultram [Tramadol] Diarrhea      The results of significant diagnostics from this hospitalization (including imaging, microbiology, ancillary and laboratory) are listed below for reference.    Significant Diagnostic Studies: DG Chest Portable 1 View Result Date: 07/11/2023 CLINICAL DATA:  Shortness of breath. EXAM: PORTABLE CHEST 1 VIEW COMPARISON:  June 07, 2023. FINDINGS: The heart size and mediastinal contours are within normal limits. Left apical scarring is noted. No acute pulmonary disease is noted. The visualized skeletal structures are unremarkable. IMPRESSION: No active disease. Electronically Signed   By: Rosalene Colon M.D.   On: 07/11/2023 08:28    Microbiology: No  results found for this or any previous visit (from the past 240 hours).   Labs: Basic Metabolic Panel: No results for input(s): "NA", "K", "CL", "CO2", "GLUCOSE", "BUN", "CREATININE", "CALCIUM", "MG", "PHOS" in the last 168 hours. Liver Function Tests: No results for input(s): "AST", "ALT", "ALKPHOS", "BILITOT", "PROT", "ALBUMIN" in the last 168 hours. No results for input(s): "LIPASE", "AMYLASE" in the last 168 hours. No results for input(s): "AMMONIA" in the last 168 hours. CBC: No results for input(s): "WBC", "NEUTROABS", "HGB", "HCT", "MCV", "PLT" in the last 168 hours. Cardiac Enzymes: No results for input(s): "CKTOTAL", "CKMB", "CKMBINDEX", "TROPONINI" in the last 168 hours. BNP: BNP (last 3 results) No results for input(s): "BNP" in the last 8760 hours.  ProBNP (last 3 results) No results for input(s): "PROBNP" in the last 8760 hours.  CBG: No results for input(s): "GLUCAP" in the last 168 hours.  Signed:  Verlie Glisson MD   [facility] 07/14/2023, 10:18 AM

## 2023-07-14 NOTE — TOC Initial Note (Signed)
 Transition of Care (TOC) - Initial/Assessment Note   Spoke to patient at bedside. Patient from home with wife. Discussed PT recommendation for HHPT, patient in agreement , no preference.   Cory with [facility] accepted referral for HHPT.   Patient has home oxygen already and has his home portable tank at bedside.  Patient Details  Name: Grant Galvan MRN: [id number] Date of Birth: [birth year]  Transition of Care [facility]) CM/SW Contact:    Kingsley Plan, RN Phone Number: 07/14/2023, 11:11 AM  Clinical Narrative:                   Expected Discharge Plan: Home w Home Health Services Barriers to Discharge: Continued Medical Work up   Patient Goals and CMS Choice Patient states their goals for this hospitalization and ongoing recovery are:: to return to home CMS Medicare.gov Compare Post Acute Care list provided to:: Patient Choice offered to / list presented to : Patient      Expected Discharge Plan and Services   Discharge Planning Services: CM Consult Post Acute Care Choice: Home Health Living arrangements for the past 2 months: Single Family Home Expected Discharge Date: 07/14/23               DME Arranged: N/A DME Agency: NA       HH Arranged: PT HH Agency: [facility] Date [facility] Agency Contacted: 07/14/23 Time HH Agency Contacted: 1110 Representative spoke with at [facility] Agency: Kandee Keen  Prior Living Arrangements/Services Living arrangements for the past 2 months: Single Family Home Lives with:: Spouse Patient language and need for interpreter reviewed:: Yes Do you feel safe going back to the place where you live?: Yes      Need for Family Participation in Patient Care: Yes (Comment) Care giver support system in place?: Yes (comment) Current home services: DME Criminal Activity/Legal Involvement Pertinent to Current Situation/Hospitalization: No - Comment as needed  Activities of Daily Living   ADL Screening (condition at time of admission) Independently  performs ADLs?: Yes (appropriate for developmental age) Is the patient deaf or have difficulty hearing?: No Does the patient have difficulty seeing, even when wearing glasses/contacts?: No Does the patient have difficulty concentrating, remembering, or making decisions?: No  Permission Sought/Granted   Permission granted to share information with : Yes, Verbal Permission Granted  Share Information with NAME: Agustin Cree Egger wife  Permission granted to share info w AGENCY: [facility]        Emotional Assessment Appearance:: Appears stated age Attitude/Demeanor/Rapport: Engaged Affect (typically observed): Appropriate Orientation: : Oriented to Self, Oriented to Place, Oriented to  Time, Oriented to Situation Alcohol / Substance Use: Not Applicable Psych Involvement: No (comment)  Admission diagnosis:  COPD exacerbation (HCC) [J44.1] Patient Active Problem List   Diagnosis Date Noted   COPD exacerbation (HCC) 07/11/2023   Chronic pain 07/11/2023   Acute on chronic respiratory failure (HCC) 07/11/2023   Hyperlipidemia 07/11/2023   Ascending aortic aneurysm (HCC) 04/03/2022   COPD with acute exacerbation (HCC) 02/14/2022   Nicotine abuse/Smoker 02/14/2022   Compression fracture of first lumbar vertebra (HCC) 02/14/2022   Avascular necrosis of bone of hip, right (HCC) 02/14/2022   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 - [place], [place] - [address] [place] [address] Phone: [phone] Fax: [phone]  [id number] - [place], [place] - [address] AT [facility] OF [address] [place] [address] Phone: [phone] Fax: [phone]  [id number] [place], [place] -  [address] [place] [address] Phone: [phone] Fax: [phone]  [facility] [address] [place] [address] Phone: [phone] Fax: [phone]     Social Drivers of Health (SDOH) Social History: SDOH Screenings   Food Insecurity: No Food Insecurity (07/11/2023)  Housing: Low Risk  (07/11/2023)  Transportation Needs: No Transportation Needs (07/11/2023)  Utilities: Not At Risk (07/11/2023)  Tobacco Use: High Risk (07/11/2023)   SDOH Interventions:     Readmission Risk Interventions     No data to display

## 2023-07-14 NOTE — Plan of Care (Signed)

## 2023-07-14 NOTE — Progress Notes (Signed)
 Pt requested to have Respiratory reevaluate his medications and admin times. Called and made them aware. RT on call stated she would speak with the Pt today and make the adjustments.

## 2023-07-14 NOTE — Progress Notes (Signed)
 Explained discharge instructions to patient. Reviewed follow up appointment and next medication administration times. Also reviewed education. Patient verbalized having an understanding for instructions given. All belongings are in the patient's possession to include his oxygen tank from home. No other needs verbalized. Transported downstairs for discharge.

## 2023-07-14 NOTE — Plan of Care (Signed)
  Problem: Education: Goal: Knowledge of General Education information will improve Description: Including pain rating scale, medication(s)/side effects and non-pharmacologic comfort measures Outcome: Adequate for Discharge   Problem: Health Behavior/Discharge Planning: Goal: Ability to manage health-related needs will improve Outcome: Adequate for Discharge   Problem: Clinical Measurements: Goal: Ability to maintain clinical measurements within normal limits will improve Outcome: Adequate for Discharge Goal: Will remain free from infection Outcome: Adequate for Discharge Goal: Diagnostic test results will improve Outcome: Adequate for Discharge Goal: Respiratory complications will improve Outcome: Adequate for Discharge Goal: Cardiovascular complication will be avoided Outcome: Adequate for Discharge   Problem: Activity: Goal: Risk for activity intolerance will decrease Outcome: Adequate for Discharge   Problem: Nutrition: Goal: Adequate nutrition will be maintained Outcome: Adequate for Discharge   Problem: Coping: Goal: Level of anxiety will decrease Outcome: Adequate for Discharge   Problem: Elimination: Goal: Will not experience complications related to bowel motility Outcome: Adequate for Discharge Goal: Will not experience complications related to urinary retention Outcome: Adequate for Discharge   Problem: Pain Managment: Goal: General experience of comfort will improve and/or be controlled Outcome: Adequate for Discharge   Problem: Safety: Goal: Ability to remain free from injury will improve Outcome: Adequate for Discharge   Problem: Skin Integrity: Goal: Risk for impaired skin integrity will decrease Outcome: Adequate for Discharge   Problem: Acute Rehab PT Goals(only PT should resolve) Goal: Pt Will Go Supine/Side To Sit Outcome: Adequate for Discharge Goal: Patient Will Transfer Sit To/From Stand Outcome: Adequate for Discharge Goal: Pt Will  Ambulate Outcome: Adequate for Discharge Goal: Pt Will Go Up/Down Stairs Outcome: Adequate for Discharge Goal: PT Additional Goal #1 Outcome: Adequate for Discharge

## 2023-07-17 ENCOUNTER — Telehealth: Payer: Self-pay

## 2023-07-17 NOTE — Telephone Encounter (Signed)
 Copied from CRM [id number]. Topic: Clinical - Home Health Verbal Orders >> Jul 17, 2023  9:53 AM Chuck Crater wrote: Caller/Agency: [facility] Callback Number: [phone] Service Requested: Physical Therapy Frequency: n/a Any new concerns about the patient? No  **Jenna wants to let provider know that there was a delay in starting home health services for patient/ they are outside of 48 hour window (04/18)

## 2023-07-20 DIAGNOSIS — M4135 Thoracogenic scoliosis, thoracolumbar region: Secondary | ICD-10-CM | POA: Diagnosis not present

## 2023-07-21 ENCOUNTER — Encounter: Payer: Self-pay | Admitting: Family Medicine

## 2023-08-01 ENCOUNTER — Encounter: Payer: Self-pay | Admitting: Family Medicine

## 2023-08-01 DIAGNOSIS — J449 Chronic obstructive pulmonary disease, unspecified: Secondary | ICD-10-CM | POA: Diagnosis not present

## 2023-08-01 DIAGNOSIS — M25559 Pain in unspecified hip: Secondary | ICD-10-CM

## 2023-08-03 MED ORDER — OXYCODONE HCL 5 MG PO TABS: 2.5000 mg | ORAL_TABLET | Freq: Four times a day (QID) | ORAL | 0 refills | Status: DC | PRN

## 2023-08-17 NOTE — Progress Notes (Signed)
 [facility] [address], [address] [place], [place] [address] [phone]  Date:  08/20/2023   Name:  Grant Galvan   DOB:  11/09/1960   MRN:  [id number]  PCP:  Kaylee Partridge, MD    Chief Complaint: Follow-up (No concerns )   History of Present Illness:  Grant Galvan is a 63 y.o. 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 Severe deformity of the right femoral head with sclerosis and lucency. These findings could be due to avascular necrosis with partial collapse of the femoral head. Sequela of remote trauma is a possibility. There is resulting severe degenerative changes with significant loss of joint space centrally and inferiorly. 2. No other abnormalities Lumbar spine films 06/08/2022 Probable scoliotic curvature of the thoracolumbar spine, apex to the left. The degree of curvature is similar since the chest CT scout film from March 29, 2022. No other malalignment. Anterior wedging of L3 is identified. This finding is stable since the CT scan dated December 17, 2020. Anterior wedging L1 is also grossly stable since December 17, 2020. No acute fractures. Degenerative disc disease with anterior osteophytes at L3-4. Lower lumbar facet degenerative changes suspected. No other abnormalities are identified.  IMPRESSION: 1. Scoliotic curvature of the thoracolumbar spine, apex to the left. 2.  Anterior wedging of L1 and L3 is stable since December 17, 2020. 3. Degenerative changes as above.    UDS is up-to-date, completed November  He is using Breo and Incruse- he is on oxygen  at home; 1-3L.  He adjusts depending on his oxygen  sats He is able to go without oxygen  some of the time His respiratory status is now back to his baseline ; unfortunately he is having frequent COPD exacerbations Mucinex  is helping- he takes it once or twice daily to help him cough up mucus He was given chantix  in the hospital but he did not like- he has some nicotine  patches that he got for free from a family member.  We discussed how to dose the nicotine  patches He knows that he must not smoke with oxygen  running  He notes his pain - esp in the right hip- is still really bad- he is on 4 pills a day and notes his pain is still often 10/10 He is able to get comfortable at times if he uses heat and sits in a comfortable chair  I again advised Grant Galvan I think his pain would improve dramatically with hip surgery but he remains unable to consider this possibility He has a motorized WC at home- however he is not able to use his chair to get out of the house as he does not have a ramp or specialized transportation.  He requests a letter for his homeowners association so that they may build a wheelchair ramp.  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    Past Surgical History:  Procedure Laterality Date   BACK SURGERY      Social History   Tobacco Use   Smoking status: Every Day    Current packs/day: 0.50    Types: Cigarettes   Smokeless tobacco: Former  Building services engineer status: Never Used  Substance Use Topics   Alcohol use: No    Comment: gave up alcohol 1995.  hx heavy   Drug use: Not Currently    Family History  Problem Relation Age of Onset   Cancer Mother    Stroke Mother    COPD Father    Cancer Father 59       prostate   Cancer Brother 70       colon    Allergies  Allergen Reactions   Celebrex [Celecoxib] Other (See Comments)    Stopped breathing   Flexeril [Cyclobenzaprine] Hives   Tylenol  [Acetaminophen ] Other (See Comments)    Liver function   Ultram [Tramadol] Diarrhea    Medication list has been reviewed and updated.  Current Outpatient Medications on File Prior to Visit  Medication Sig Dispense Refill   albuterol  (VENTOLIN  HFA) 108 (90 Base) MCG/ACT inhaler Inhale 2 puffs into the lungs every 6 (six) hours as needed for wheezing or shortness of breath. 18 g 5   ALPRAZolam  (XANAX ) 0.5 MG tablet Take 1 tablet (0.5 mg total) by mouth 2 (two) times daily as needed for anxiety. 12 tablet 0   amLODipine  (NORVASC ) 10 MG tablet Take 1 tablet (10 mg total) by mouth daily. 90 tablet 3   clotrimazole -betamethasone  (LOTRISONE ) cream Apply 1 Application topically 2 (two) times daily. 45 g 2   fluticasone  furoate-vilanterol (BREO ELLIPTA ) 100-25 MCG/ACT AEPB Inhale 1 puff into the lungs daily. 60 each 3   guaiFENesin  (MUCINEX ) 600 MG 12 hr tablet Take 1 tablet (600 mg total) by mouth 2 (two) times daily. 60 tablet 0   ipratropium-albuterol  (DUONEB)  0.5-2.5 (3) MG/3ML SOLN Take 3 mLs by nebulization every 6 (six) hours as needed. (Patient taking differently: Take 3 mLs by nebulization 4 (four) times daily.) 360 mL 3   oxyCODONE  (ROXICODONE ) 5 MG immediate release tablet Take 0.5-1 tablets (2.5-5 mg total) by mouth every 6 (six) hours as needed for severe pain (pain score 7-10). 120 tablet 0   predniSONE  (DELTASONE ) 20 MG tablet Take 3 tablets (60 mg total) by mouth daily with breakfast. 4 tablet 0   rosuvastatin  (CRESTOR ) 10 MG tablet Take 1 tablet (10 mg total) by mouth daily. 90 tablet 3   triamcinolone  cream (KENALOG ) 0.5 % Apply topically 2 (two) times daily. Use as needed for rash on extremities 30 g 1   umeclidinium bromide  (INCRUSE ELLIPTA ) 62.5 MCG/ACT AEPB Inhale 1 puff into the lungs  daily. 90 each 9   varenicline  (CHANTIX ) 1 MG tablet Take 0.5 tablets (0.5 mg total) by mouth 2 (two) times daily for 2 days, THEN 1 tablet (1 mg total) 2 (two) times daily. 138 tablet 0   No current facility-administered medications on file prior to visit.    Review of Systems:  As per HPI- otherwise negative.   Physical Examination: Vitals:   08/20/23 0938  BP: 136/84  Pulse: 74  SpO2: 98%   Vitals:   08/20/23 0938  Weight: 173 lb (78.5 kg)  Height: 6\' 3"  (1.905 m)   Body mass index is 21.62 kg/m. Ideal Body Weight: Weight in (lb) to have BMI = 25: 199.6  GEN: no acute distress.  Appears his normal self, wearing oxygen  and in wheelchair.  Slender build HEENT: Atraumatic, Normocephalic.  Ears and Nose: No external deformity. CV: RRR, No M/G/R. No JVD. No thrill. No extra heart sounds. PULM: CTA B, no wheezes, crackles, rhonchi. No retractions. No resp. distress. No accessory muscle use. EXTR: No c/c/e PSYCH: Normally interactive. Conversant.  Lungs sound good today  Assessment and Plan: Chronic obstructive pulmonary disease, unspecified COPD type (HCC) - Plan: DG Chest 2 View, Ambulatory referral to Pulmonology  Oxygen   dependent - Plan: Ambulatory referral to Pulmonology  Chronic pain syndrome  Abnormal gait  Primary hypertension  Pain in joint involving pelvic region and thigh, unspecified laterality - Plan: oxyCODONE  (ROXICODONE ) 5 MG immediate release tablet  Mobility impaired  Patient seen today for follow-up.  He has COPD; currently improved with a combination of Breo Ellipta  and Incruse ellipta  inhalers He uses oxygen  as needed, he does not smoke with oxygen  running He plans to try nicotine  patches to assist in smoking cessation He would like to see pulmonology closer to his home in [place] if possible, placed referral  Referral to care management in hopes of arranging accessible transportation  Chronic pain due to severe right hip dysfunction/avascular necrosis as well as spine disease.  Patient has refused surgical or other interventional treatment.  He is miserable with pain.  I will increase his monthly allotment of oxycodone  5 from 120 to 140 tablets; advised him this does need to last 30 days  Signed Gates Kasal, MD

## 2023-08-19 DIAGNOSIS — M4135 Thoracogenic scoliosis, thoracolumbar region: Secondary | ICD-10-CM | POA: Diagnosis not present

## 2023-08-19 DIAGNOSIS — J441 Chronic obstructive pulmonary disease with (acute) exacerbation: Secondary | ICD-10-CM | POA: Diagnosis not present

## 2023-08-19 DIAGNOSIS — G894 Chronic pain syndrome: Secondary | ICD-10-CM | POA: Diagnosis not present

## 2023-08-19 DIAGNOSIS — M160 Bilateral primary osteoarthritis of hip: Secondary | ICD-10-CM | POA: Diagnosis not present

## 2023-08-19 DIAGNOSIS — I2510 Atherosclerotic heart disease of native coronary artery without angina pectoris: Secondary | ICD-10-CM | POA: Diagnosis not present

## 2023-08-19 DIAGNOSIS — R269 Unspecified abnormalities of gait and mobility: Secondary | ICD-10-CM | POA: Diagnosis not present

## 2023-08-19 DIAGNOSIS — M25551 Pain in right hip: Secondary | ICD-10-CM | POA: Diagnosis not present

## 2023-08-19 DIAGNOSIS — M1990 Unspecified osteoarthritis, unspecified site: Secondary | ICD-10-CM | POA: Diagnosis not present

## 2023-08-19 DIAGNOSIS — Z9181 History of falling: Secondary | ICD-10-CM | POA: Diagnosis not present

## 2023-08-20 ENCOUNTER — Ambulatory Visit (INDEPENDENT_AMBULATORY_CARE_PROVIDER_SITE_OTHER): Admitting: Family Medicine

## 2023-08-20 ENCOUNTER — Encounter: Payer: Self-pay | Admitting: Family Medicine

## 2023-08-20 VITALS — BP 136/84 | HR 74 | Ht 75.0 in | Wt 173.0 lb

## 2023-08-20 DIAGNOSIS — Z9981 Dependence on supplemental oxygen: Secondary | ICD-10-CM

## 2023-08-20 DIAGNOSIS — R269 Unspecified abnormalities of gait and mobility: Secondary | ICD-10-CM

## 2023-08-20 DIAGNOSIS — J449 Chronic obstructive pulmonary disease, unspecified: Secondary | ICD-10-CM | POA: Diagnosis not present

## 2023-08-20 DIAGNOSIS — Z7409 Other reduced mobility: Secondary | ICD-10-CM

## 2023-08-20 DIAGNOSIS — M25559 Pain in unspecified hip: Secondary | ICD-10-CM | POA: Diagnosis not present

## 2023-08-20 DIAGNOSIS — G894 Chronic pain syndrome: Secondary | ICD-10-CM

## 2023-08-20 DIAGNOSIS — I10 Essential (primary) hypertension: Secondary | ICD-10-CM

## 2023-08-20 MED ORDER — OXYCODONE HCL 5 MG PO TABS
2.5000 mg | ORAL_TABLET | ORAL | 0 refills | Status: DC | PRN
Start: 2023-08-20 — End: 2023-09-23

## 2023-08-20 NOTE — Patient Instructions (Signed)
 We will get you set up with pulmonology in [place]- you can give them a call  Address: [address], [place], [place] [address] Phone: [phone]  We will work on setting up specialized transport so you can use your motorized chair   We can increase your oxycodone  to 140 pills per month.

## 2023-08-22 ENCOUNTER — Telehealth: Payer: Self-pay

## 2023-08-22 NOTE — Progress Notes (Unsigned)
 Complex Care Management Note Care Guide Note  08/22/2023 Name: Grant Galvan MRN: [id number] DOB: [birth year]   Complex Care Management Outreach Attempts: An unsuccessful telephone outreach was attempted today to offer the patient information about available complex care management services.  Follow Up Plan:  Additional outreach attempts will be made to offer the patient complex care management information and services.   Encounter Outcome:  No Answer  [facility]  [facility], [facility] Care Management Assistant Direct Dial: [phone]  Fax: [phone]

## 2023-08-27 NOTE — Progress Notes (Unsigned)
 Complex Care Management Note Care Guide Note  08/27/2023 Name: Grant Galvan MRN: [id number] DOB: 1960/09/24   Complex Care Management Outreach Attempts: A second unsuccessful outreach was attempted today to offer the patient with information about available complex care management services.  Follow Up Plan:  Additional outreach attempts will be made to offer the patient complex care management information and services.   Encounter Outcome:  No Answer  [facility]  [facility], [facility] Care Management Assistant Direct Dial: [phone]  Fax: [phone]

## 2023-08-28 NOTE — Progress Notes (Signed)
 Complex Care Management Note  Care Guide Note 08/28/2023 Name: Grant Galvan MRN: [id number] DOB: 09-05-1960  Grant Galvan is a 63 y.o. year old male who sees Copland, Skipper Dumas, MD for primary care. I reached out to Jerrell Mora by phone today to offer complex care management services.  [name] was given information about Complex Care Management services today including:   The Complex Care Management services include support from the care team which includes your Nurse Care Manager, Clinical Social Worker, or Pharmacist.  The Complex Care Management team is here to help remove barriers to the health concerns and goals most important to you. Complex Care Management services are voluntary, and the patient may decline or stop services at any time by request to their care team member.   Complex Care Management Consent Status: Patient agreed to services and verbal consent obtained.   Follow up plan:  Telephone appointment with complex care management team member scheduled for:  09/02/23 at 11:00 a.m.   Encounter Outcome:  Patient Scheduled  [facility]  [facility], [facility] Care Management Assistant Direct Dial: [phone]  Fax: [phone]

## 2023-09-01 DIAGNOSIS — J449 Chronic obstructive pulmonary disease, unspecified: Secondary | ICD-10-CM | POA: Diagnosis not present

## 2023-09-02 ENCOUNTER — Other Ambulatory Visit: Payer: Self-pay | Admitting: Licensed Clinical Social Worker

## 2023-09-02 NOTE — Patient Instructions (Signed)
 Visit Information  Thank you for taking time to visit with me today. Please don't hesitate to contact me if I can be of assistance to you before our next scheduled appointment.  Your next care management appointment is by telephone on 09/16/2023 at 11am  Telephone follow up appointment date/time:  09/16/2023 11am  Please call the care guide team at [phone] if you need to cancel, schedule, or reschedule an appointment.   Please call 911 if you are experiencing a Mental Health or Behavioral Health Crisis or need someone to talk to.  Fletcher Humble MSW, LCSW Licensed Clinical Social Worker  [facility], Population Health Direct Dial: [phone]  Fax: [phone]

## 2023-09-02 NOTE — Patient Outreach (Signed)
 Complex Care Management   Visit Note  09/02/2023  Name:  Grant Galvan MRN: [id number] DOB: 04-Jun-1960  Situation: Referral received for Complex Care Management related to SDOH Barriers:  Transportation I obtained verbal consent from Caregiver.  Visit completed with D Nikolov   on the phone. Pt needs assistance securing wheelchair transportation to medical appt. LCSW A [employer] completed [facility] application. Information provided for community housing solutions to install wheelchair ramp, encouraged Ms. Sleeper to consider [facility] for affordable options and alternatives.   Background:   Past Medical History:  Diagnosis Date   COPD (chronic obstructive pulmonary disease) (HCC)    Degenerative disc disease    reports back fx x 3.    Assessment: Patient Reported Symptoms: Completed assessment with spouse. Pt did not engaged with ph visit with LCSW A Jaicion Laurie   Cognitive Cognitive Status: Unable to Assess Cognitive/Intellectual Conditions Management [RPT]: Not Assessed      Neurological Neurological Review of Symptoms: Not assessed    HEENT HEENT Symptoms Reported: Not assessed      Cardiovascular Cardiovascular Symptoms Reported: Not assessed Does patient have uncontrolled Hypertension?: No Cardiovascular Conditions: Hypertension  Respiratory Respiratory Symptoms Reported: Not assesed Respiratory Conditions: COPD  Endocrine Patient reports the following symptoms related to hypoglycemia or hyperglycemia : Not assessed    Gastrointestinal Gastrointestinal Symptoms Reported: Not assessed      Genitourinary Genitourinary Symptoms Reported: Not assessed    Integumentary Integumentary Symptoms Reported: Not assessed    Musculoskeletal Musculoskelatal Symptoms Reviewed: Not assessed   Falls in the past year?: No Number of falls in past year: 1 or less Was there an injury with Fall?: No Fall Risk Category Calculator: 0 Patient Fall Risk Level: Low Fall Risk     Psychosocial       Quality of Family Relationships: involved, supportive, helpful Do you feel physically threatened by others?: No      11/25/2016    8:37 AM  Depression screen PHQ 2/9  Decreased Interest 0  Down, Depressed, Hopeless 0  PHQ - 2 Score 0    There were no vitals filed for this visit.  Medications Reviewed Today     Reviewed by Fletcher Humble, LCSW (Social Worker) on 09/02/23 at 1156  Med List Status: <None>   Medication Order Taking? Sig Documenting Provider Last Dose Status Informant  albuterol  (VENTOLIN  HFA) 108 (90 Base) MCG/ACT inhaler [id number] No Inhale 2 puffs into the lungs every 6 (six) hours as needed for wheezing or shortness of breath. Copland, Skipper Dumas, MD Taking Active Self, Pharmacy Records  ALPRAZolam  (XANAX ) 0.5 MG tablet [id number] No Take 1 tablet (0.5 mg total) by mouth 2 (two) times daily as needed for anxiety. Samtani, Jai-Gurmukh, MD Taking Active   amLODipine  (NORVASC ) 10 MG tablet [id number] No Take 1 tablet (10 mg total) by mouth daily. Copland, Skipper Dumas, MD Taking Active Self, Pharmacy Records  clotrimazole -betamethasone  (LOTRISONE ) cream [id number] No Apply 1 Application topically 2 (two) times daily. Copland, Skipper Dumas, MD Taking Active Self, Pharmacy Records  fluticasone  furoate-vilanterol (BREO ELLIPTA ) 100-25 MCG/ACT AEPB [id number] No Inhale 1 puff into the lungs daily. Samtani, Jai-Gurmukh, MD Taking Active   guaiFENesin  (MUCINEX ) 600 MG 12 hr tablet [id number] No Take 1 tablet (600 mg total) by mouth 2 (two) times daily. Samtani, Jai-Gurmukh, MD Taking Active   ipratropium-albuterol  (DUONEB) 0.5-2.5 (3) MG/3ML SOLN [id number] No Take 3 mLs by nebulization every 6 (six) hours as needed.  Patient taking differently: Take 3 mLs by nebulization 4 (four)  times daily.   Copland, Skipper Dumas, MD Taking Active Self, Pharmacy Records  oxyCODONE  (ROXICODONE ) 5 MG immediate release tablet [id number]  Take 0.5-1 tablets (2.5-5 mg total) by mouth every 4  (four) hours as needed for severe pain (pain score 7-10). This is a 30 day supply Copland, Skipper Dumas, MD  Active   predniSONE  (DELTASONE ) 20 MG tablet [id number] No Take 3 tablets (60 mg total) by mouth daily with breakfast. Samtani, Jai-Gurmukh, MD Taking Active   rosuvastatin  (CRESTOR ) 10 MG tablet [id number] No Take 1 tablet (10 mg total) by mouth daily. Copland, Skipper Dumas, MD Taking Active Self, Pharmacy Records  triamcinolone  cream (KENALOG ) 0.5 % [id number] No Apply topically 2 (two) times daily. Use as needed for rash on extremities Copland, Skipper Dumas, MD Taking Active Self, Pharmacy Records  umeclidinium bromide  (INCRUSE ELLIPTA ) 62.5 MCG/ACT AEPB [id number] No Inhale 1 puff into the lungs daily. Copland, Skipper Dumas, MD Taking Active Self, Pharmacy Records  varenicline  (CHANTIX ) 1 MG tablet [id number] No Take 0.5 tablets (0.5 mg total) by mouth 2 (two) times daily for 2 days, THEN 1 tablet (1 mg total) 2 (two) times daily. Samtani, Jai-Gurmukh, MD Taking Active             Recommendation:   Continue Current Plan of Care Contact and collaborate with community resources provided for transportation and wheelchair ramp  Follow Up Plan:   Telephone follow up appointment date/time:  09/16/2023 11am  Fletcher Humble MSW, LCSW Licensed Clinical Social Worker  [facility], Population Health Direct Dial: [phone]  Fax: [phone]

## 2023-09-08 ENCOUNTER — Other Ambulatory Visit: Payer: Self-pay | Admitting: Family Medicine

## 2023-09-08 DIAGNOSIS — J449 Chronic obstructive pulmonary disease, unspecified: Secondary | ICD-10-CM

## 2023-09-09 ENCOUNTER — Encounter: Payer: Self-pay | Admitting: Family Medicine

## 2023-09-09 DIAGNOSIS — J449 Chronic obstructive pulmonary disease, unspecified: Secondary | ICD-10-CM

## 2023-09-10 MED ORDER — IPRATROPIUM-ALBUTEROL 0.5-2.5 (3) MG/3ML IN SOLN
3.0000 mL | RESPIRATORY_TRACT | 3 refills | Status: DC | PRN
Start: 2023-09-10 — End: 2023-09-10

## 2023-09-10 MED ORDER — IPRATROPIUM-ALBUTEROL 0.5-2.5 (3) MG/3ML IN SOLN: 3.0000 mL | RESPIRATORY_TRACT | 3 refills | Status: DC | PRN

## 2023-09-10 NOTE — Telephone Encounter (Signed)
 Called him back- he is using his duoneb up to 6x a day and needs an adjustment to his rx, and would like to send it to [facility] pharmacy in [place]  instead of using the local reasonable pharmacy  Meds ordered this encounter  Medications   ipratropium-albuterol  (DUONEB) 0.5-2.5 (3) MG/3ML SOLN    Sig: Take 3 mLs by nebulization every 4 (four) hours as needed. Max 6 per 24 hours    Dispense:  460 mL    Refill:  3    Dx: J44.1    We also discussed her recent issue with his oxygen  supplier at home.  I received the following message from his oxygen  supplier THE PT STATES HE WAS TOLD THAT HE NEEDED TO ONLY USE HIS TANKS INORDER TO CONTROL THE LITER FLOW.   HE IS NOT USING HIS HOME CONCENTRATOR AT ALL AND WANTING >30 TANKS WEEKLY.  WE CAN NOT SUSTAIN THIS TYPE OF TANK USAGE FOR A 2 LPM PATIENT.   CAN SOMEONE PLEASE CONFIRM THIS IS WHAT THE PT WAS TOLD?  THE STATIONARY MACHINES IN THE HOME ARE WHAT INSURANCE PAYS FOR 'IN THE HOME', AND THEY CAN GO FROM 1-5 LPM.   Today patient tells me he prefers to use the oxygen  tanks all the time because the concentrator is more difficult for him to adjust.  I explained that unfortunately his oxygen  supplier cannot provide this many oxygen  tanks and that he does need to use his concentrator at least some of the time.  I advised him I am not able to state that he has been instructed to only use tanks because this would not be truthful

## 2023-09-10 NOTE — Telephone Encounter (Signed)
 Please see the MyChart message reply(ies) for my assessment and plan.  The patient gave consent for this Medical Advice Message and is aware that it may result in a bill to their insurance company as well as the possibility that this may result in a co-payment or deductible. They are an established patient, but are not seeking medical advice exclusively about a problem treated during an in person or video visit in the last 7 days. I did not recommend an in person or video visit within 7 days of my reply.  I spent a total of 15 minutes cumulative time within 7 days through [employer] Abbe Amsterdam, MD

## 2023-09-10 NOTE — Addendum Note (Signed)
 Addended by: Gates Kasal C on: 09/10/2023 02:33 PM   Modules accepted: Orders

## 2023-09-12 ENCOUNTER — Telehealth: Payer: Self-pay

## 2023-09-12 DIAGNOSIS — J449 Chronic obstructive pulmonary disease, unspecified: Secondary | ICD-10-CM

## 2023-09-12 MED ORDER — IPRATROPIUM-ALBUTEROL 0.5-2.5 (3) MG/3ML IN SOLN: 3.0000 mL | RESPIRATORY_TRACT | 3 refills | Status: AC | PRN

## 2023-09-12 NOTE — Addendum Note (Signed)
 Addended by: Gates Kasal C on: 09/12/2023 04:48 PM   Modules accepted: Orders

## 2023-09-12 NOTE — Telephone Encounter (Signed)
 Copied from CRM [id number]. Topic: Clinical - Prescription Issue >> Sep 12, 2023 12:07 PM Albertha Alosa wrote: Reason for CRM: Patient called in regarding speaking to Dr.Copland and nurses, stated he needs to change the prescription so it says 4 - 6 times a day , stated he will be running out by Monday if that isnt sent to the pharmacy

## 2023-09-14 ENCOUNTER — Emergency Department (HOSPITAL_COMMUNITY)

## 2023-09-14 ENCOUNTER — Encounter (HOSPITAL_COMMUNITY): Payer: Self-pay

## 2023-09-14 ENCOUNTER — Other Ambulatory Visit: Payer: Self-pay

## 2023-09-14 ENCOUNTER — Emergency Department (HOSPITAL_COMMUNITY)
Admission: EM | Admit: 2023-09-14 | Discharge: 2023-09-15 | Disposition: A | Discharge status: Home / Self Care | Attending: Emergency Medicine | Admitting: Emergency Medicine

## 2023-09-14 DIAGNOSIS — J449 Chronic obstructive pulmonary disease, unspecified: Secondary | ICD-10-CM | POA: Diagnosis not present

## 2023-09-14 DIAGNOSIS — J441 Chronic obstructive pulmonary disease with (acute) exacerbation: Secondary | ICD-10-CM | POA: Insufficient documentation

## 2023-09-14 DIAGNOSIS — I10 Essential (primary) hypertension: Secondary | ICD-10-CM | POA: Diagnosis not present

## 2023-09-14 DIAGNOSIS — R002 Palpitations: Secondary | ICD-10-CM | POA: Diagnosis not present

## 2023-09-14 DIAGNOSIS — R0602 Shortness of breath: Secondary | ICD-10-CM | POA: Diagnosis present

## 2023-09-14 MED ORDER — IPRATROPIUM-ALBUTEROL 0.5-2.5 (3) MG/3ML IN SOLN
3.0000 mL | RESPIRATORY_TRACT | Status: AC
  Administered 2023-09-14 (×3): 3 mL via RESPIRATORY_TRACT
  Filled 2023-09-14: qty 9

## 2023-09-14 MED ORDER — PREDNISONE 20 MG PO TABS
60.0000 mg | ORAL_TABLET | Freq: Once | ORAL | Status: AC
  Administered 2023-09-14: 60 mg via ORAL
  Filled 2023-09-14: qty 3

## 2023-09-14 MED ORDER — OXYCODONE HCL 5 MG PO TABS
5.0000 mg | ORAL_TABLET | Freq: Once | ORAL | Status: AC
  Administered 2023-09-14: 5 mg via ORAL
  Filled 2023-09-14: qty 1

## 2023-09-14 MED ORDER — HYDROXYZINE HCL 25 MG PO TABS
25.0000 mg | ORAL_TABLET | Freq: Once | ORAL | Status: AC
  Administered 2023-09-14: 25 mg via ORAL
  Filled 2023-09-14: qty 1

## 2023-09-14 MED ORDER — PREDNISONE 10 MG PO TABS
40.0000 mg | ORAL_TABLET | Freq: Every day | ORAL | 0 refills | Status: AC
Start: 2023-09-14 — End: 2023-09-19

## 2023-09-14 NOTE — ED Provider Notes (Signed)
 [place] EMERGENCY DEPARTMENT AT [facility] Provider Note   CSN: [id number] Arrival date & time: 09/14/23  2111     History Chief Complaint  Patient presents with   Shortness of Breath    Grant Galvan is a 63 y.o. male w/ PMHx COPD on [place] 3L who presents to the ED for evaluation of shortness of breath.  Patient reports gradually worsening chest tightness and shortness of breath throughout the day.  He reports taking 2 breathing treatments at home with no significant improvement therefore called EMS.  He states EMS gave additional DuoNeb treatment prior to coming to the emergency room.  He also states he has significant anxiety.  Patient states he takes Mucinex  for baseline sputum production but states despite compliance he does have increased sputum.  Patient denies any fevers chills.  No chest pain.  No lower extremity swelling.  No nasal congestion.  Patient is refusing all needle pokes.    Physical Exam Updated Vital Signs BP (!) 138/90   Pulse 89   Temp 98 F (36.7 C) (Oral)   Resp (!) 22   Ht 6' 3 (1.905 m)   Wt 79.4 kg   SpO2 100%   BMI 21.87 kg/m  Physical Exam Vitals and nursing note reviewed.  Constitutional:      General: He is not in acute distress.    Appearance: He is well-developed.     Comments: Chronically ill-appearing   HENT:     Head: Normocephalic and atraumatic.   Eyes:     Conjunctiva/sclera: Conjunctivae normal.    Cardiovascular:     Rate and Rhythm: Normal rate and regular rhythm.     Heart sounds: No murmur heard. Pulmonary:     Effort: Pulmonary effort is normal. No tachypnea or respiratory distress.     Breath sounds: Wheezing present.  Abdominal:     Palpations: Abdomen is soft.     Tenderness: There is no abdominal tenderness.   Musculoskeletal:        General: No swelling.   Skin:    General: Skin is warm and dry.     Capillary Refill: Capillary refill takes less than 2 seconds.   Neurological:     Mental  Status: He is alert.   Psychiatric:        Mood and Affect: Mood is anxious.     ED Results / Procedures / Treatments   Labs (all labs ordered are listed, but only abnormal results are displayed) Labs Reviewed - No data to display  EKG EKG Interpretation Date/Time:  Sunday September 14 2023 21:18:44 EDT Ventricular Rate:  90 PR Interval:  145 QRS Duration:  109 QT Interval:  372 QTC Calculation: 456 R Axis:   83  Text Interpretation: Sinus rhythm Consider right atrial enlargement Borderline right axis deviation RSR' in V1 or V2, probably normal variant Baseline wander in lead(s) V3 No significant change since last tracing Confirmed by Albertus Hughs [phone]) on 09/14/2023 9:27:52 PM  Radiology DG Chest 2 View Result Date: 09/14/2023 EXAM: 2 VIEW(S) XRAY OF THE CHEST 09/14/2023 09:42:00 PM COMPARISON: 07/11/2023 CLINICAL HISTORY: COPD. Shob copd FINDINGS: LUNGS AND PLEURA: Hyperinflation with emphysematous changes. No focal pulmonary opacity. No pulmonary edema. No pleural effusion. No pneumothorax. HEART AND MEDIASTINUM: No acute abnormality of the cardiac and mediastinal silhouettes. BONES AND SOFT TISSUES: Mild degenerative changes of the mid thoracic spine. No acute osseous abnormality. IMPRESSION: 1. No acute cardiopulmonary abnormality. Electronically signed by: Zadie Herter MD 09/14/2023 09:46  PM EDT RP Workstation: [id number]    Medications Ordered in ED Medications  oxyCODONE  (Oxy IR/ROXICODONE ) immediate release tablet 5 mg (5 mg Oral Given 09/14/23 2254)  predniSONE  (DELTASONE ) tablet 60 mg (60 mg Oral Given 09/14/23 2253)  ipratropium-albuterol  (DUONEB) 0.5-2.5 (3) MG/3ML nebulizer solution 3 mL (3 mLs Nebulization Given 09/14/23 2300)  hydrOXYzine (ATARAX) tablet 25 mg (25 mg Oral Given 09/14/23 2254)    ED Course/ Medical Decision Making/ A&P  SHERWIN HOLLINGSHED is a 63 y.o. male presents as detailed above  Differential ddx: COPD exacerbation, anxiety attack, pneumonia  On  arrival, patient afebrile hemodynamically stable no hypoxia or respiratory distress.Saturating 100% on 3L [place]  Patient is refusing all needle pokes.  Patient agreeable to chest x-ray and EKG. Will administer additional DuoNeb treatments Home dose oxycodone  for back pain Atarax for anxiety and prednisone  for COPD exacerbation. Patient had good response to symptomatic treatment.  Patient remains on baseline oxygen .  Patient remains without respiratory distress.  Believe patient is stable for outpatient treatment and follow-up.  Will prescribe course of prednisone .  Advised to continue all inhalers.  Patient voiced understanding agrees with plan   Overall impression mild COPD exacerbation, anxiety Patient stable for discharge and outpatient follow-up. Strict return precautions provided. Patient voices understanding and agrees with plan.   Patient seen with supervising physician who agrees with plan.  Final Clinical Impression(s) / ED Diagnoses Final diagnoses:  COPD exacerbation (HCC)    Rx / DC Orders ED Discharge Orders          Ordered    predniSONE  (DELTASONE ) 10 MG tablet  Daily        09/14/23 2329            Angele Keller, DO PGY-3 Emergency Medicine    Angele Keller, DO 09/14/23 2351    Albertus Hughs, DO 09/15/23 2318

## 2023-09-14 NOTE — Discharge Instructions (Signed)
 Please take prednisone  40 mg daily for the next 5 days.  Please continue breathing treatments.  Please follow-up with your doctor in the next week. Thank you for allowing us  to take care of you today.  We hope you begin feeling better soon.   To-Do:  Please follow-up with your primary doctor within the next 2-3 days. Please return to the Emergency Department or call 911 if you experience chest pain, shortness of breath, severe pain, severe fever, altered mental status, or have any reason to think that you need emergency medical care.  Thank you again.  Hope you feel better soon.  [facility] Department of Emergency Medicine

## 2023-09-14 NOTE — ED Triage Notes (Signed)
 Pt bib [facility] coming from home with c/o sob that began about an hour ago. Pt states he doesn't know if he had a panic attack or not. Pt does have COPD hx and states he normally takes 4-6 duonebs a day. Pt denies chest pain. Pt is on 3L [place] (baseline). Pt is alert and oriented x4. Pt states he will refuse any IV start or blood draw.   EMS VS: 190/110 100% 3L [place] 112 HR 12 Lead unremarkable

## 2023-09-14 NOTE — ED Notes (Signed)
 ED Provider at bedside.

## 2023-09-16 ENCOUNTER — Other Ambulatory Visit: Payer: Self-pay | Admitting: Licensed Clinical Social Worker

## 2023-09-17 NOTE — Patient Outreach (Signed)
 Complex Care Management   Visit Note  09/17/2023  Name:  Grant Galvan MRN: [id number] DOB: [birth year]  Situation: Referral received for Complex Care Management related to SDOH Barriers:  Transportation I obtained verbal consent from Caregiver.  Visit completed with Darlene Buehring  on the phone. Pt unable to complete call with LCSW A Jarrell Armond. Pt home does not have adequate wheelchair ramp. Encouraged Ms. Chiang to contact [employer] [facility] for assistance with installing wheelchair ramp. Ms. Chalfin also given contact information for [employer] for installing a wheelchair ramp. Additionally, Ms. Cherian is informed to explore [facility] for low cost wheelchair ramp options. Application for transportation assistance completed and submitted to [facility]. Pt encouraged to contact insurer for options and given contact information for local medical transportation agencies.   Background:   Past Medical History:  Diagnosis Date   COPD (chronic obstructive pulmonary disease) (HCC)    Degenerative disc disease    reports back fx x 3.    Assessment: Patient Reported Symptoms:  Cognitive Cognitive Status: Unable to Assess Cognitive/Intellectual Conditions Management [RPT]: Not Assessed      Neurological Neurological Review of Symptoms: Not assessed    HEENT HEENT Symptoms Reported: Not assessed      Cardiovascular Cardiovascular Symptoms Reported: No symptoms reported Does patient have uncontrolled Hypertension?: No Cardiovascular Conditions: Hypertension  Respiratory Respiratory Symptoms Reported: Not assesed Respiratory Conditions: COPD  Endocrine Patient reports the following symptoms related to hypoglycemia or hyperglycemia : Not assessed Is patient diabetic?: No    Gastrointestinal Gastrointestinal Symptoms Reported: Not assessed      Genitourinary Genitourinary Symptoms Reported: Not assessed    Integumentary Integumentary Symptoms Reported: Not  assessed    Musculoskeletal Musculoskelatal Symptoms Reviewed: Not assessed   Falls in the past year?: No Number of falls in past year: 1 or less Was there an injury with Fall?: No Fall Risk Category Calculator: 0 Patient Fall Risk Level: Low Fall Risk    Psychosocial       Quality of Family Relationships: involved, supportive, helpful Do you feel physically threatened by others?: No      11/25/2016    8:37 AM  Depression screen PHQ 2/9  Decreased Interest 0  Down, Depressed, Hopeless 0  PHQ - 2 Score 0    There were no vitals filed for this visit.  Medications Reviewed Today     Reviewed by Fletcher Humble, LCSW (Social Worker) on 09/17/23 at 1302  Med List Status: <None>   Medication Order Taking? Sig Documenting Provider Last Dose Status Informant  albuterol  (VENTOLIN  HFA) 108 (90 Base) MCG/ACT inhaler [id number] Yes Inhale 2 puffs into the lungs every 6 (six) hours as needed for wheezing or shortness of breath. Copland, Skipper Dumas, MD  Active Self, Pharmacy Records  ALPRAZolam  (XANAX ) 0.5 MG tablet [id number] Yes Take 1 tablet (0.5 mg total) by mouth 2 (two) times daily as needed for anxiety. Samtani, Jai-Gurmukh, MD  Active   amLODipine  (NORVASC ) 10 MG tablet [id number] Yes Take 1 tablet (10 mg total) by mouth daily. Copland, Skipper Dumas, MD  Active Self, Pharmacy Records  clotrimazole -betamethasone  (LOTRISONE ) cream [id number] Yes Apply 1 Application topically 2 (two) times daily. Copland, Skipper Dumas, MD  Active Self, Pharmacy Records  fluticasone  furoate-vilanterol (BREO ELLIPTA ) 100-25 MCG/ACT AEPB [id number] Yes Inhale 1 puff into the lungs daily. Samtani, Jai-Gurmukh, MD  Active   guaiFENesin  (MUCINEX ) 600 MG 12 hr tablet [id number] Yes Take 1 tablet (600 mg total) by mouth 2 (two) times  daily. Samtani, Jai-Gurmukh, MD  Active   ipratropium-albuterol  (DUONEB) 0.5-2.5 (3) MG/3ML SOLN [id number] Yes Take 3 mLs by nebulization every 4 (four) hours as needed. Can use 4-6 times daily  as needed. Max 6 per 24 hours Copland, Skipper Dumas, MD  Active   oxyCODONE  (ROXICODONE ) 5 MG immediate release tablet [id number] Yes Take 0.5-1 tablets (2.5-5 mg total) by mouth every 4 (four) hours as needed for severe pain (pain score 7-10). This is a 30 day supply Copland, Skipper Dumas, MD  Active   predniSONE  (DELTASONE ) 10 MG tablet [id number] Yes Take 4 tablets (40 mg total) by mouth daily for 5 days. Angele Keller, DO  Active   rosuvastatin  (CRESTOR ) 10 MG tablet [id number] Yes Take 1 tablet (10 mg total) by mouth daily. Copland, Skipper Dumas, MD  Active Self, Pharmacy Records  triamcinolone  cream (KENALOG ) 0.5 % [id number] Yes Apply topically 2 (two) times daily. Use as needed for rash on extremities Copland, Jessica C, MD  Active Self, Pharmacy Records  umeclidinium bromide  (INCRUSE ELLIPTA ) 62.5 MCG/ACT AEPB [id number] Yes Inhale 1 puff into the lungs daily. Copland, Skipper Dumas, MD  Active Self, Pharmacy Records  varenicline  (CHANTIX ) 1 MG tablet [id number] Yes Take 0.5 tablets (0.5 mg total) by mouth 2 (two) times daily for 2 days, THEN 1 tablet (1 mg total) 2 (two) times daily. Samtani, Jai-Gurmukh, MD  Active             Recommendation:   Continue Current Plan of Care  Follow Up Plan:   Patient has met all care management goals. Care Management case will be closed. Patient has been provided contact information should new needs arise.   Fletcher Humble MSW, LCSW Licensed Clinical Social Worker  [facility], Population Health Direct Dial: [phone]  Fax: [phone]

## 2023-09-17 NOTE — Patient Instructions (Signed)

## 2023-09-19 ENCOUNTER — Telehealth: Payer: Self-pay | Admitting: Family Medicine

## 2023-09-19 ENCOUNTER — Encounter: Payer: Self-pay | Admitting: Family Medicine

## 2023-09-19 DIAGNOSIS — M4135 Thoracogenic scoliosis, thoracolumbar region: Secondary | ICD-10-CM | POA: Diagnosis not present

## 2023-09-19 NOTE — Telephone Encounter (Signed)
 Copied from CRM [id number]. Topic: Medicare AWV >> Sep 19, 2023  1:01 PM Juliana Ocean wrote: Reason for CRM: LVM 09/19/2023 to schedule AWV. Please schedule Virtual or Telehealth visits ONLY.   Rosalee Collins; Care Guide Ambulatory Clinical Support [place] [facility] Direct Dial: [phone]

## 2023-09-22 DIAGNOSIS — J449 Chronic obstructive pulmonary disease, unspecified: Secondary | ICD-10-CM | POA: Diagnosis not present

## 2023-09-23 ENCOUNTER — Encounter: Payer: Self-pay | Admitting: Family Medicine

## 2023-09-23 DIAGNOSIS — M25559 Pain in unspecified hip: Secondary | ICD-10-CM

## 2023-09-23 MED ORDER — FLUTICASONE FUROATE-VILANTEROL 100-25 MCG/ACT IN AEPB: 1.0000 | INHALATION_SPRAY | Freq: Every day | RESPIRATORY_TRACT | 8 refills | Status: DC

## 2023-09-23 MED ORDER — OXYCODONE HCL 5 MG PO TABS: 2.5000 mg | ORAL_TABLET | ORAL | 0 refills | Status: DC | PRN

## 2023-10-01 DIAGNOSIS — J449 Chronic obstructive pulmonary disease, unspecified: Secondary | ICD-10-CM | POA: Diagnosis not present

## 2023-10-19 DIAGNOSIS — M4135 Thoracogenic scoliosis, thoracolumbar region: Secondary | ICD-10-CM | POA: Diagnosis not present

## 2023-10-21 ENCOUNTER — Encounter: Payer: Self-pay | Admitting: Family Medicine

## 2023-10-21 DIAGNOSIS — M25559 Pain in unspecified hip: Secondary | ICD-10-CM

## 2023-10-22 MED ORDER — FLUTICASONE FUROATE-VILANTEROL 100-25 MCG/ACT IN AEPB: 1.0000 | INHALATION_SPRAY | Freq: Every day | RESPIRATORY_TRACT | 8 refills | Status: AC

## 2023-10-22 MED ORDER — OXYCODONE HCL 5 MG PO TABS: 2.5000 mg | ORAL_TABLET | ORAL | 0 refills | Status: DC | PRN

## 2023-10-23 ENCOUNTER — Ambulatory Visit: Admitting: Internal Medicine

## 2023-10-29 ENCOUNTER — Ambulatory Visit: Payer: Self-pay

## 2023-10-29 NOTE — Telephone Encounter (Signed)
 FYI Only or Action Required?: Action required by provider: medication refill request.  Patient was last seen in primary care on 08/20/2023 by Copland, Harlene BROCKS, MD.  Called Nurse Triage reporting Cough.  Symptoms began several days ago.  Interventions attempted: Nothing.  Symptoms are: gradually worsening.  Triage Disposition: See Physician Within 24 Hours  Patient/caregiver understands and will follow disposition?: no, wishes to speak to PCP  Copied from CRM #[id number]. Topic: Clinical - Red Word Triage >> Oct 29, 2023  4:07 PM Gennette ORN wrote: Red Word that prompted transfer to Nurse Triage: Patient has trouble breathing and coughing up yellow stuff. Reason for Disposition  [1] Known COPD or other severe lung disease (i.e., bronchiectasis, cystic fibrosis, lung surgery) AND [2] symptoms getting worse (i.e., increased sputum purulence or amount, increased breathing difficulty  Answer Assessment - Initial Assessment Questions 1. ONSET: When did the cough begin?      4 days 2. SEVERITY: How bad is the cough today?      Getting severe 3. SPUTUM: Describe the color of your sputum (e.g., none, dry cough; clear, white, yellow, green)     yellow 4. HEMOPTYSIS: Are you coughing up any blood? If Yes, ask: How much? (e.g., flecks, streaks, tablespoons, etc.)     denies 5. DIFFICULTY BREATHING: Are you having difficulty breathing? If Yes, ask: How bad is it? (e.g., mild, moderate, severe)     Only when coughing 6. FEVER: Do you have a fever? If Yes, ask: What is your temperature, how was it measured, and when did it start?     denies 8. LUNG HISTORY: Do you have any history of lung disease?  (e.g., pulmonary embolus, asthma, emphysema)     COPD on home o2 10. OTHER SYMPTOMS: Do you have any other symptoms? (e.g., runny nose, wheezing, chest pain)       Denies Pt wife calling on behalf of pt, she states that she does not want an appt, Dr Watt usually sends in an  antibiotic and couple days of prednisonedermatitis Declined appt at this time.  Protocols used: Cough - Acute Productive-A-AH

## 2023-10-30 ENCOUNTER — Encounter (INDEPENDENT_AMBULATORY_CARE_PROVIDER_SITE_OTHER): Payer: Self-pay | Admitting: Family Medicine

## 2023-10-30 DIAGNOSIS — J209 Acute bronchitis, unspecified: Secondary | ICD-10-CM

## 2023-10-30 DIAGNOSIS — J441 Chronic obstructive pulmonary disease with (acute) exacerbation: Secondary | ICD-10-CM

## 2023-10-31 ENCOUNTER — Telehealth: Payer: Self-pay

## 2023-10-31 DIAGNOSIS — J209 Acute bronchitis, unspecified: Secondary | ICD-10-CM

## 2023-10-31 DIAGNOSIS — J441 Chronic obstructive pulmonary disease with (acute) exacerbation: Secondary | ICD-10-CM

## 2023-10-31 DIAGNOSIS — J440 Chronic obstructive pulmonary disease with acute lower respiratory infection: Secondary | ICD-10-CM | POA: Diagnosis not present

## 2023-10-31 MED ORDER — DOXYCYCLINE HYCLATE 100 MG PO TABS: 100.0000 mg | ORAL_TABLET | Freq: Two times a day (BID) | ORAL | 0 refills | Status: DC

## 2023-10-31 MED ORDER — PREDNISONE 20 MG PO TABS
ORAL_TABLET | ORAL | 0 refills | Status: DC
Start: 2023-10-31 — End: 2023-11-05

## 2023-10-31 MED ORDER — PREDNISONE 20 MG PO TABS: ORAL_TABLET | ORAL | 0 refills | Status: DC

## 2023-10-31 MED ORDER — DOXYCYCLINE HYCLATE 100 MG PO TABS
100.0000 mg | ORAL_TABLET | Freq: Two times a day (BID) | ORAL | 0 refills | Status: DC
Start: 2023-10-31 — End: 2024-02-09

## 2023-10-31 NOTE — Telephone Encounter (Signed)

## 2023-10-31 NOTE — Addendum Note (Signed)
 Addended by: WATT RAISIN C on: 10/31/2023 11:47 AM   Modules accepted: Orders

## 2023-10-31 NOTE — Telephone Encounter (Signed)
 Copied from CRM #[id number]. Topic: Clinical - Medical Advice >> Oct 30, 2023  1:55 PM Thersia C wrote: Reason for CRM: Patient called in wanting to know if he can get some medications sent over Protozone and amoxicillin  , patient stated he usually goes to the ER when he is feeling like this and they prescribed it at the ER for him

## 2023-10-31 NOTE — Telephone Encounter (Signed)
 Looks like this was addressed in separate encounter.   Copied from CRM [id number]. Topic: Clinical - Prescription Issue >> Oct 31, 2023  9:15 AM Berneda FALCON wrote: Reason for CRM: Pt states the pharmacy is telling him they did not get the prescriptions that Dr. Watt sent over for him this morning.   Wants to know if we can please call the pharmacy and make sure they have it as this is something he needs. Wife is waiting at the pharmacy

## 2023-11-01 DIAGNOSIS — J449 Chronic obstructive pulmonary disease, unspecified: Secondary | ICD-10-CM | POA: Diagnosis not present

## 2023-11-05 ENCOUNTER — Encounter: Payer: Self-pay | Admitting: Family Medicine

## 2023-11-05 DIAGNOSIS — J209 Acute bronchitis, unspecified: Secondary | ICD-10-CM

## 2023-11-05 MED ORDER — PREDNISONE 20 MG PO TABS: ORAL_TABLET | ORAL | 0 refills | Status: DC

## 2023-11-19 ENCOUNTER — Encounter: Payer: Self-pay | Admitting: Family Medicine

## 2023-11-19 DIAGNOSIS — M25559 Pain in unspecified hip: Secondary | ICD-10-CM

## 2023-11-20 MED ORDER — OXYCODONE HCL 5 MG PO TABS
2.5000 mg | ORAL_TABLET | ORAL | 0 refills | Status: DC | PRN
Start: 2023-11-20 — End: 2023-11-20

## 2023-11-20 MED ORDER — OXYCODONE HCL 5 MG PO TABS: 2.5000 mg | ORAL_TABLET | ORAL | 0 refills | Status: DC | PRN

## 2023-11-20 NOTE — Addendum Note (Signed)
 Addended by: WATT RAISIN C on: 11/20/2023 04:29 PM   Modules accepted: Orders

## 2023-11-25 ENCOUNTER — Encounter: Payer: Self-pay | Admitting: Family Medicine

## 2023-11-25 DIAGNOSIS — J209 Acute bronchitis, unspecified: Secondary | ICD-10-CM

## 2023-11-25 DIAGNOSIS — G8929 Other chronic pain: Secondary | ICD-10-CM

## 2023-11-25 MED ORDER — PREDNISONE 20 MG PO TABS
ORAL_TABLET | ORAL | 0 refills | Status: DC
Start: 2023-11-25 — End: 2023-12-04

## 2023-11-26 NOTE — Addendum Note (Signed)
 Addended by: WATT RAISIN C on: 11/26/2023 05:24 AM   Modules accepted: Orders

## 2023-12-02 DIAGNOSIS — J449 Chronic obstructive pulmonary disease, unspecified: Secondary | ICD-10-CM | POA: Diagnosis not present

## 2023-12-03 ENCOUNTER — Encounter: Payer: Self-pay | Admitting: Family Medicine

## 2023-12-03 DIAGNOSIS — J209 Acute bronchitis, unspecified: Secondary | ICD-10-CM

## 2023-12-04 MED ORDER — PREDNISONE 20 MG PO TABS: ORAL_TABLET | ORAL | 0 refills | Status: DC

## 2023-12-10 ENCOUNTER — Ambulatory Visit: Admitting: Internal Medicine

## 2023-12-15 DIAGNOSIS — J449 Chronic obstructive pulmonary disease, unspecified: Secondary | ICD-10-CM | POA: Diagnosis not present

## 2023-12-22 ENCOUNTER — Encounter: Payer: Self-pay | Admitting: Family Medicine

## 2023-12-22 DIAGNOSIS — I10 Essential (primary) hypertension: Secondary | ICD-10-CM

## 2023-12-22 DIAGNOSIS — I2510 Atherosclerotic heart disease of native coronary artery without angina pectoris: Secondary | ICD-10-CM

## 2023-12-22 DIAGNOSIS — M25559 Pain in unspecified hip: Secondary | ICD-10-CM

## 2023-12-22 MED ORDER — AMLODIPINE BESYLATE 10 MG PO TABS: 10.0000 mg | ORAL_TABLET | Freq: Every day | ORAL | 3 refills | Status: DC

## 2023-12-22 MED ORDER — OXYCODONE HCL 5 MG PO TABS: 2.5000 mg | ORAL_TABLET | ORAL | 0 refills | Status: DC | PRN

## 2023-12-22 MED ORDER — ROSUVASTATIN CALCIUM 10 MG PO TABS: 10.0000 mg | ORAL_TABLET | Freq: Every day | ORAL | 3 refills | Status: AC

## 2024-01-01 DIAGNOSIS — J449 Chronic obstructive pulmonary disease, unspecified: Secondary | ICD-10-CM | POA: Diagnosis not present

## 2024-01-07 ENCOUNTER — Encounter: Payer: Self-pay | Admitting: Family Medicine

## 2024-01-07 DIAGNOSIS — J209 Acute bronchitis, unspecified: Secondary | ICD-10-CM

## 2024-01-07 DIAGNOSIS — J449 Chronic obstructive pulmonary disease, unspecified: Secondary | ICD-10-CM | POA: Diagnosis not present

## 2024-01-07 MED ORDER — PREDNISONE 20 MG PO TABS: ORAL_TABLET | ORAL | 0 refills | Status: DC

## 2024-01-13 ENCOUNTER — Ambulatory Visit: Admitting: Internal Medicine

## 2024-01-13 NOTE — Progress Notes (Deleted)
 Grant Galvan, male    DOB: 1960/09/15    MRN: [id number]   Brief patient profile:  63  yo*** *** referred to pulmonary clinic in [place]  01/13/2024 by *** for ***   Pt not previously seen by PCCM service.     History of Present Illness  01/13/2024  Pulmonary/ 1st office eval/ Marciana Uplinger / [place] [facility]  No chief complaint on file.    Dyspnea:  *** Cough: *** Sleep: *** SABA use: *** 02: *** LDSCT:***  No obvious day to day or daytime pattern/variability or assoc excess/ purulent sputum or mucus plugs or hemoptysis or cp or chest tightness, subjective wheeze or overt sinus or hb symptoms.    Also denies any obvious fluctuation of symptoms with weather or environmental changes or other aggravating or alleviating factors except as outlined above   No unusual exposure hx or h/o childhood pna/ asthma or knowledge of premature birth.  Current Allergies, Complete Past Medical History, Past Surgical History, Family History, and Social History were reviewed in [employer] record.  ROS  The following are not active complaints unless bolded Hoarseness, sore throat, dysphagia, dental problems, itching, sneezing,  nasal congestion or discharge of excess mucus or purulent secretions, ear ache,   fever, chills, sweats, unintended wt loss or wt gain, classically pleuritic or exertional cp,  orthopnea pnd or arm/hand swelling  or leg swelling, presyncope, palpitations, abdominal pain, anorexia, nausea, vomiting, diarrhea  or change in bowel habits or change in bladder habits, change in stools or change in urine, dysuria, hematuria,  rash, arthralgias, visual complaints, headache, numbness, weakness or ataxia or problems with walking or coordination,  change in mood or  memory.            Outpatient Medications Prior to Visit  Medication Sig Dispense Refill   albuterol  (VENTOLIN  HFA) 108 (90 Base) MCG/ACT inhaler Inhale 2 puffs into the lungs every 6 (six) hours as  needed for wheezing or shortness of breath. 18 g 5   ALPRAZolam  (XANAX ) 0.5 MG tablet Take 1 tablet (0.5 mg total) by mouth 2 (two) times daily as needed for anxiety. 12 tablet 0   amLODipine  (NORVASC ) 10 MG tablet Take 1 tablet (10 mg total) by mouth daily. 90 tablet 3   clotrimazole -betamethasone  (LOTRISONE ) cream Apply 1 Application topically 2 (two) times daily. 45 g 2   doxycycline  (VIBRA -TABS) 100 MG tablet Take 1 tablet (100 mg total) by mouth 2 (two) times daily. 20 tablet 0   fluticasone  furoate-vilanterol (BREO ELLIPTA ) 100-25 MCG/ACT AEPB Inhale 1 puff into the lungs daily. 60 each 8   guaiFENesin  (MUCINEX ) 600 MG 12 hr tablet Take 1 tablet (600 mg total) by mouth 2 (two) times daily. 60 tablet 0   ipratropium-albuterol  (DUONEB) 0.5-2.5 (3) MG/3ML SOLN Take 3 mLs by nebulization every 4 (four) hours as needed. Can use 4-6 times daily as needed. Max 6 per 24 hours 460 mL 3   oxyCODONE  (ROXICODONE ) 5 MG immediate release tablet Take 0.5-1 tablets (2.5-5 mg total) by mouth every 4 (four) hours as needed for severe pain (pain score 7-10). Pt plans to fill on 12/29/23 140 tablet 0   predniSONE  (DELTASONE ) 20 MG tablet Take 20 mg a mouth daily for up to 7 days 7 tablet 0   rosuvastatin  (CRESTOR ) 10 MG tablet Take 1 tablet (10 mg total) by mouth daily. 90 tablet 3   triamcinolone  cream (KENALOG ) 0.5 % Apply topically 2 (two) times daily. Use as needed for  rash on extremities 30 g 1   umeclidinium bromide  (INCRUSE ELLIPTA ) 62.5 MCG/ACT AEPB Inhale 1 puff into the lungs daily. 90 each 9   No facility-administered medications prior to visit.    Past Medical History:  Diagnosis Date   COPD (chronic obstructive pulmonary disease) (HCC)    Degenerative disc disease    reports back fx x 3.      Objective:     There were no vitals taken for this visit.         Assessment

## 2024-01-20 ENCOUNTER — Encounter: Payer: Self-pay | Admitting: Family Medicine

## 2024-01-20 DIAGNOSIS — M25559 Pain in unspecified hip: Secondary | ICD-10-CM

## 2024-01-20 DIAGNOSIS — M1990 Unspecified osteoarthritis, unspecified site: Secondary | ICD-10-CM | POA: Diagnosis not present

## 2024-01-20 DIAGNOSIS — J441 Chronic obstructive pulmonary disease with (acute) exacerbation: Secondary | ICD-10-CM | POA: Diagnosis not present

## 2024-01-20 DIAGNOSIS — I2510 Atherosclerotic heart disease of native coronary artery without angina pectoris: Secondary | ICD-10-CM | POA: Diagnosis not present

## 2024-01-20 DIAGNOSIS — M160 Bilateral primary osteoarthritis of hip: Secondary | ICD-10-CM | POA: Diagnosis not present

## 2024-01-20 DIAGNOSIS — I10 Essential (primary) hypertension: Secondary | ICD-10-CM | POA: Diagnosis not present

## 2024-01-20 DIAGNOSIS — M25551 Pain in right hip: Secondary | ICD-10-CM | POA: Diagnosis not present

## 2024-01-20 DIAGNOSIS — R269 Unspecified abnormalities of gait and mobility: Secondary | ICD-10-CM | POA: Diagnosis not present

## 2024-01-20 DIAGNOSIS — G894 Chronic pain syndrome: Secondary | ICD-10-CM | POA: Diagnosis not present

## 2024-01-20 MED ORDER — OXYCODONE HCL 5 MG PO TABS: 2.5000 mg | ORAL_TABLET | ORAL | 0 refills | Status: DC | PRN

## 2024-02-05 ENCOUNTER — Encounter: Payer: Self-pay | Admitting: Family Medicine

## 2024-02-05 DIAGNOSIS — J209 Acute bronchitis, unspecified: Secondary | ICD-10-CM

## 2024-02-05 MED ORDER — PREDNISONE 20 MG PO TABS
ORAL_TABLET | ORAL | 0 refills | Status: DC
Start: 2024-02-05 — End: 2024-02-11

## 2024-02-09 ENCOUNTER — Emergency Department (HOSPITAL_COMMUNITY)

## 2024-02-09 ENCOUNTER — Other Ambulatory Visit: Payer: Self-pay

## 2024-02-09 ENCOUNTER — Observation Stay (HOSPITAL_COMMUNITY)
Admission: EM | Admit: 2024-02-09 | Discharge: 2024-02-11 | Disposition: A | Discharge status: Home / Self Care | Attending: Internal Medicine | Admitting: Internal Medicine

## 2024-02-09 ENCOUNTER — Encounter (HOSPITAL_COMMUNITY): Payer: Self-pay

## 2024-02-09 DIAGNOSIS — I10 Essential (primary) hypertension: Secondary | ICD-10-CM | POA: Insufficient documentation

## 2024-02-09 DIAGNOSIS — J984 Other disorders of lung: Secondary | ICD-10-CM | POA: Diagnosis not present

## 2024-02-09 DIAGNOSIS — R231 Pallor: Secondary | ICD-10-CM | POA: Diagnosis not present

## 2024-02-09 DIAGNOSIS — Z79899 Other long term (current) drug therapy: Secondary | ICD-10-CM | POA: Insufficient documentation

## 2024-02-09 DIAGNOSIS — R069 Unspecified abnormalities of breathing: Secondary | ICD-10-CM | POA: Diagnosis not present

## 2024-02-09 DIAGNOSIS — J432 Centrilobular emphysema: Secondary | ICD-10-CM | POA: Diagnosis not present

## 2024-02-09 DIAGNOSIS — J449 Chronic obstructive pulmonary disease, unspecified: Secondary | ICD-10-CM

## 2024-02-09 DIAGNOSIS — E785 Hyperlipidemia, unspecified: Secondary | ICD-10-CM | POA: Diagnosis not present

## 2024-02-09 DIAGNOSIS — J441 Chronic obstructive pulmonary disease with (acute) exacerbation: Secondary | ICD-10-CM | POA: Diagnosis not present

## 2024-02-09 DIAGNOSIS — R9431 Abnormal electrocardiogram [ECG] [EKG]: Secondary | ICD-10-CM | POA: Diagnosis not present

## 2024-02-09 DIAGNOSIS — F40231 Fear of injections and transfusions: Secondary | ICD-10-CM | POA: Insufficient documentation

## 2024-02-09 DIAGNOSIS — I4581 Long QT syndrome: Secondary | ICD-10-CM | POA: Insufficient documentation

## 2024-02-09 DIAGNOSIS — F17210 Nicotine dependence, cigarettes, uncomplicated: Secondary | ICD-10-CM | POA: Insufficient documentation

## 2024-02-09 DIAGNOSIS — I2510 Atherosclerotic heart disease of native coronary artery without angina pectoris: Secondary | ICD-10-CM | POA: Diagnosis not present

## 2024-02-09 DIAGNOSIS — R0602 Shortness of breath: Secondary | ICD-10-CM | POA: Diagnosis not present

## 2024-02-09 DIAGNOSIS — I7121 Aneurysm of the ascending aorta, without rupture: Secondary | ICD-10-CM | POA: Diagnosis not present

## 2024-02-09 DIAGNOSIS — R000 Tachycardia, unspecified: Secondary | ICD-10-CM | POA: Diagnosis not present

## 2024-02-09 DIAGNOSIS — Z8679 Personal history of other diseases of the circulatory system: Secondary | ICD-10-CM

## 2024-02-09 DIAGNOSIS — J9611 Chronic respiratory failure with hypoxia: Secondary | ICD-10-CM | POA: Diagnosis not present

## 2024-02-09 DIAGNOSIS — F419 Anxiety disorder, unspecified: Secondary | ICD-10-CM | POA: Insufficient documentation

## 2024-02-09 DIAGNOSIS — R935 Abnormal findings on diagnostic imaging of other abdominal regions, including retroperitoneum: Secondary | ICD-10-CM | POA: Insufficient documentation

## 2024-02-09 DIAGNOSIS — G8929 Other chronic pain: Secondary | ICD-10-CM | POA: Insufficient documentation

## 2024-02-09 DIAGNOSIS — I499 Cardiac arrhythmia, unspecified: Secondary | ICD-10-CM | POA: Diagnosis not present

## 2024-02-09 LAB — RESP PANEL BY RT-PCR (RSV, FLU A&B, COVID)  RVPGX2
Influenza A by PCR: NEGATIVE
Influenza B by PCR: NEGATIVE
Resp Syncytial Virus by PCR: NEGATIVE
SARS Coronavirus 2 by RT PCR: NEGATIVE

## 2024-02-09 MED ORDER — GUAIFENESIN ER 600 MG PO TB12
600.0000 mg | ORAL_TABLET | Freq: Two times a day (BID) | ORAL | Status: DC | PRN
  Administered 2024-02-10 – 2024-02-11 (×2): 600 mg via ORAL
  Filled 2024-02-09 (×2): qty 1

## 2024-02-09 MED ORDER — GUAIFENESIN ER 600 MG PO TB12
600.0000 mg | ORAL_TABLET | Freq: Two times a day (BID) | ORAL | Status: DC
  Administered 2024-02-09: 600 mg via ORAL
  Filled 2024-02-09: qty 1

## 2024-02-09 MED ORDER — DOXYCYCLINE HYCLATE 100 MG PO TABS
100.0000 mg | ORAL_TABLET | Freq: Once | ORAL | Status: AC
  Administered 2024-02-09: 100 mg via ORAL
  Filled 2024-02-09: qty 1

## 2024-02-09 MED ORDER — IPRATROPIUM BROMIDE 0.02 % IN SOLN
0.5000 mg | Freq: Four times a day (QID) | RESPIRATORY_TRACT | Status: DC
  Administered 2024-02-09 – 2024-02-10 (×4): 0.5 mg via RESPIRATORY_TRACT
  Filled 2024-02-09 (×4): qty 2.5

## 2024-02-09 MED ORDER — UMECLIDINIUM BROMIDE 62.5 MCG/ACT IN AEPB
1.0000 | INHALATION_SPRAY | Freq: Every day | RESPIRATORY_TRACT | Status: DC
  Administered 2024-02-10 – 2024-02-11 (×2): 1 via RESPIRATORY_TRACT
  Filled 2024-02-09: qty 7

## 2024-02-09 MED ORDER — DOXYCYCLINE HYCLATE 100 MG PO TABS
100.0000 mg | ORAL_TABLET | Freq: Two times a day (BID) | ORAL | Status: DC
  Administered 2024-02-09 – 2024-02-11 (×4): 100 mg via ORAL
  Filled 2024-02-09 (×4): qty 1

## 2024-02-09 MED ORDER — PREDNISONE 20 MG PO TABS
40.0000 mg | ORAL_TABLET | Freq: Every day | ORAL | Status: DC
  Administered 2024-02-10 – 2024-02-11 (×2): 40 mg via ORAL
  Filled 2024-02-09 (×2): qty 2

## 2024-02-09 MED ORDER — FLUTICASONE FUROATE-VILANTEROL 100-25 MCG/ACT IN AEPB
1.0000 | INHALATION_SPRAY | Freq: Every day | RESPIRATORY_TRACT | Status: DC
  Administered 2024-02-10 – 2024-02-11 (×2): 1 via RESPIRATORY_TRACT
  Filled 2024-02-09 (×2): qty 28

## 2024-02-09 MED ORDER — MAGNESIUM OXIDE -MG SUPPLEMENT 400 (240 MG) MG PO TABS
400.0000 mg | ORAL_TABLET | Freq: Once | ORAL | Status: AC
  Administered 2024-02-09: 400 mg via ORAL
  Filled 2024-02-09: qty 1

## 2024-02-09 MED ORDER — FLUTICASONE FUROATE-VILANTEROL 100-25 MCG/ACT IN AEPB
1.0000 | INHALATION_SPRAY | RESPIRATORY_TRACT | Status: AC
  Administered 2024-02-09: 1 via RESPIRATORY_TRACT
  Filled 2024-02-09: qty 28

## 2024-02-09 MED ORDER — OXYCODONE HCL 5 MG PO TABS
10.0000 mg | ORAL_TABLET | ORAL | Status: AC
  Administered 2024-02-09: 10 mg via ORAL
  Filled 2024-02-09: qty 2

## 2024-02-09 MED ORDER — IPRATROPIUM BROMIDE 0.02 % IN SOLN: 0.5000 mg | Freq: Four times a day (QID) | RESPIRATORY_TRACT | Status: DC

## 2024-02-09 MED ORDER — POLYETHYLENE GLYCOL 3350 17 G PO PACK: 17.0000 g | PACK | Freq: Every day | ORAL | Status: DC | PRN

## 2024-02-09 MED ORDER — OXYCODONE HCL 5 MG PO TABS
2.5000 mg | ORAL_TABLET | ORAL | Status: DC | PRN
  Administered 2024-02-09 – 2024-02-11 (×7): 5 mg via ORAL
  Filled 2024-02-09 (×7): qty 1

## 2024-02-09 MED ORDER — ALPRAZOLAM 0.5 MG PO TABS
0.5000 mg | ORAL_TABLET | Freq: Two times a day (BID) | ORAL | Status: DC | PRN
Start: 2024-02-09 — End: 2024-02-11
  Administered 2024-02-09 – 2024-02-11 (×5): 0.5 mg via ORAL
  Filled 2024-02-09: qty 1
  Filled 2024-02-09: qty 2
  Filled 2024-02-09 (×4): qty 1

## 2024-02-09 MED ORDER — ALBUTEROL SULFATE HFA 108 (90 BASE) MCG/ACT IN AERS
4.0000 | INHALATION_SPRAY | Freq: Once | RESPIRATORY_TRACT | Status: AC
  Administered 2024-02-09: 4 via RESPIRATORY_TRACT
  Filled 2024-02-09: qty 6.7

## 2024-02-09 MED ORDER — ALBUTEROL SULFATE (2.5 MG/3ML) 0.083% IN NEBU
10.0000 mg/h | INHALATION_SOLUTION | Freq: Once | RESPIRATORY_TRACT | Status: AC
  Administered 2024-02-09: 10 mg/h via RESPIRATORY_TRACT
  Filled 2024-02-09: qty 12

## 2024-02-09 MED ORDER — IPRATROPIUM-ALBUTEROL 0.5-2.5 (3) MG/3ML IN SOLN
3.0000 mL | Freq: Once | RESPIRATORY_TRACT | Status: AC
  Administered 2024-02-09: 3 mL via RESPIRATORY_TRACT
  Filled 2024-02-09: qty 3

## 2024-02-09 MED ORDER — PREDNISONE 20 MG PO TABS
60.0000 mg | ORAL_TABLET | Freq: Once | ORAL | Status: AC
  Administered 2024-02-09: 60 mg via ORAL
  Filled 2024-02-09: qty 3

## 2024-02-09 MED ORDER — UMECLIDINIUM BROMIDE 62.5 MCG/ACT IN AEPB
1.0000 | INHALATION_SPRAY | RESPIRATORY_TRACT | Status: AC
  Administered 2024-02-09: 1 via RESPIRATORY_TRACT
  Filled 2024-02-09: qty 7

## 2024-02-09 MED ORDER — ALBUTEROL SULFATE (2.5 MG/3ML) 0.083% IN NEBU
2.5000 mg | INHALATION_SOLUTION | RESPIRATORY_TRACT | Status: DC | PRN
  Administered 2024-02-09 – 2024-02-11 (×3): 2.5 mg via RESPIRATORY_TRACT
  Filled 2024-02-09 (×3): qty 3

## 2024-02-09 MED ORDER — ROSUVASTATIN CALCIUM 5 MG PO TABS
10.0000 mg | ORAL_TABLET | Freq: Every day | ORAL | Status: DC
  Administered 2024-02-10 – 2024-02-11 (×2): 10 mg via ORAL
  Filled 2024-02-09 (×2): qty 2

## 2024-02-09 NOTE — ED Notes (Signed)
 MD DC bipap/cpap and placed on 2L [place]

## 2024-02-09 NOTE — ED Notes (Signed)
MD notified about BP

## 2024-02-09 NOTE — Progress Notes (Signed)
 Patient alert and oriented x4, refusing full body audit and gown.

## 2024-02-09 NOTE — ED Notes (Signed)
 Pt refused blood draw or IV placement, stated I dont do needles

## 2024-02-09 NOTE — ED Triage Notes (Signed)
 Patient BIB EMS for evaluation of shortness of breath. Per EMS, patient woke up at 4am struggling to breathe, self administered 2 neb treatments. Patient with wheezing, arrives on CPAP. 2 duonebs and one albuterol  neb with EMS. Patient refusing IV with EMS and on arrival.  HR 100 BP 110/60 SpO2 100% on CPAP RR 24

## 2024-02-09 NOTE — ED Provider Notes (Signed)
 Grant Galvan   CSN: [id number] Arrival date & time: 02/09/24  9468     Patient presents with: Shortness of Breath   Grant Galvan is a 63 y.o. male.   Patient presents to the emergency department for evaluation of shortness of breath.  Patient awakened tonight with severe shortness of breath.  He is brought to the emergency department by ambulance from home.  Patient has refused IV during transport.  He was placed on CPAP and given 2 DuoNebs during transport.       Prior to Admission medications   Medication Sig Start Date End Date Taking? Authorizing Provider  albuterol  (VENTOLIN  HFA) 108 (90 Base) MCG/ACT inhaler Inhale 2 puffs into the lungs every 6 (six) hours as needed for wheezing or shortness of breath. 05/12/23   Copland, Harlene BROCKS, MD  ALPRAZolam  (XANAX ) 0.5 MG tablet Take 1 tablet (0.5 mg total) by mouth 2 (two) times daily as needed for anxiety. 07/14/23   Samtani, Jai-Gurmukh, MD  amLODipine  (NORVASC ) 10 MG tablet Take 1 tablet (10 mg total) by mouth daily. 12/22/23   Copland, Harlene BROCKS, MD  clotrimazole -betamethasone  (LOTRISONE ) cream Apply 1 Application topically 2 (two) times daily. 07/07/23   Copland, Harlene BROCKS, MD  doxycycline  (VIBRA -TABS) 100 MG tablet Take 1 tablet (100 mg total) by mouth 2 (two) times daily. 10/31/23   Copland, Harlene BROCKS, MD  fluticasone  furoate-vilanterol (BREO ELLIPTA ) 100-25 MCG/ACT AEPB Inhale 1 puff into the lungs daily. 10/22/23   Copland, Jessica C, MD  guaiFENesin  (MUCINEX ) 600 MG 12 hr tablet Take 1 tablet (600 mg total) by mouth 2 (two) times daily. 07/14/23   Samtani, Jai-Gurmukh, MD  ipratropium-albuterol  (DUONEB) 0.5-2.5 (3) MG/3ML SOLN Take 3 mLs by nebulization every 4 (four) hours as needed. Can use 4-6 times daily as needed. Max 6 per 24 hours 09/12/23   Copland, Harlene BROCKS, MD  oxyCODONE  (ROXICODONE ) 5 MG immediate release tablet Take 0.5-1 tablets (2.5-5 mg total) by mouth every 4  (four) hours as needed for severe pain (pain score 7-10). Pt plans to fill on 01/27/24 01/20/24   Copland, Harlene BROCKS, MD  predniSONE  (DELTASONE ) 20 MG tablet Take 40 mg by mouth daily for 3 days, then 20 mg daily by mouth for 3 days if needed 02/05/24   Copland, Harlene BROCKS, MD  rosuvastatin  (CRESTOR ) 10 MG tablet Take 1 tablet (10 mg total) by mouth daily. 12/22/23   Copland, Harlene BROCKS, MD  triamcinolone  cream (KENALOG ) 0.5 % Apply topically 2 (two) times daily. Use as needed for rash on extremities 07/07/23   Copland, Harlene BROCKS, MD  umeclidinium bromide  (INCRUSE ELLIPTA ) 62.5 MCG/ACT AEPB Inhale 1 puff into the lungs daily. 06/03/23   Copland, Jessica C, MD    Allergies: Celebrex [celecoxib], Flexeril [cyclobenzaprine], Tylenol  [acetaminophen ], and Ultram [tramadol]    Review of Systems  Updated Vital Signs BP 111/80   Pulse (!) 101   Resp (!) 23   Ht 6' 3 (1.905 m)   Wt 78 kg   SpO2 100%   BMI 21.49 kg/m   Physical Exam Vitals and nursing Galvan reviewed.  Constitutional:      General: He is not in acute distress.    Appearance: He is well-developed.  HENT:     Head: Normocephalic and atraumatic.     Mouth/Throat:     Mouth: Mucous membranes are moist.  Eyes:     General: Vision grossly intact. Gaze aligned appropriately.  Extraocular Movements: Extraocular movements intact.     Conjunctiva/sclera: Conjunctivae normal.  Cardiovascular:     Rate and Rhythm: Normal rate and regular rhythm.     Pulses: Normal pulses.     Heart sounds: Normal heart sounds, S1 normal and S2 normal. No murmur heard.    No friction rub. No gallop.  Pulmonary:     Effort: Pulmonary effort is normal. No respiratory distress.     Breath sounds: Wheezing present.  Abdominal:     Palpations: Abdomen is soft.     Tenderness: There is no abdominal tenderness. There is no guarding or rebound.     Hernia: No hernia is present.  Musculoskeletal:        General: No swelling.     Cervical back: Full passive  range of motion without pain, normal range of motion and neck supple. No pain with movement, spinous process tenderness or muscular tenderness. Normal range of motion.     Right lower leg: No edema.     Left lower leg: No edema.  Skin:    General: Skin is warm and dry.     Capillary Refill: Capillary refill takes less than 2 seconds.     Findings: No ecchymosis, erythema, lesion or wound.  Neurological:     Mental Status: He is alert and oriented to person, place, and time.     GCS: GCS eye subscore is 4. GCS verbal subscore is 5. GCS motor subscore is 6.     Cranial Nerves: Cranial nerves 2-12 are intact.     Sensory: Sensation is intact.     Motor: Motor function is intact. No weakness or abnormal muscle tone.     Coordination: Coordination is intact.  Psychiatric:        Mood and Affect: Mood normal.        Speech: Speech normal.        Behavior: Behavior normal.     (all labs ordered are listed, but only abnormal results are displayed) Labs Reviewed - No data to display  EKG: EKG Interpretation Date/Time:  Monday February 09 2024 05:40:25 EST Ventricular Rate:  94 PR Interval:  141 QRS Duration:  110 QT Interval:  409 QTC Calculation: 512 R Axis:   74  Text Interpretation: Sinus rhythm Nonspecific ST depression, anterior leads Prolonged QT interval Confirmed by Haze Lonni PARAS ([id number]) on 02/09/2024 5:50:19 AM  Radiology: [facility] Chest Port 1 View Result Date: 02/09/2024 CLINICAL DATA:  Shortness of breath. EXAM: PORTABLE CHEST 1 VIEW COMPARISON:  09/14/2023 FINDINGS: Lungs are hyperexpanded. Areas of architectural distortion/scarring noted in the upper lungs bilaterally, similar to prior. No pulmonary edema or focal lung consolidation. No substantial pleural effusion. Cardiopericardial silhouette is at upper limits of normal for size. Telemetry leads overlie the chest. IMPRESSION: Hyperexpansion with chronic scarring in the upper lungs bilaterally. No acute  cardiopulmonary findings. Electronically Signed   By: Camellia Candle M.D.   On: 02/09/2024 06:35     Procedures   Medications Ordered in the ED  predniSONE  (DELTASONE ) tablet 60 mg (60 mg Oral Given 02/09/24 0616)  albuterol  (PROVENTIL ) (2.5 MG/3ML) 0.083% nebulizer solution (10 mg/hr Nebulization Given 02/09/24 0556)                                    Medical Decision Making Amount and/or Complexity of Data Reviewed Radiology: ordered and independent interpretation performed. Decision-making details documented in ED Course.  ECG/medicine tests: ordered and independent interpretation performed. Decision-making details documented in ED Course.  Risk OTC drugs. Prescription drug management.   Differential Diagnosis considered includes, but not limited to: COPD exacerbation; Bronchitis; Pneumonia; CHF; ACS; PE  Presents to the emergency department for evaluation of shortness of breath.  Patient has a history of COPD and continues to smoke.  Patient developed shortness of breath overnight, self administered nebulizer treatments without improvement.  He was brought to the emergency department on CPAP.  Patient has refused blood draws and IV for medication administration.  He is adamant that he does not do needles.  Reviewing his records reveals that he has never had blood drawn here, at times has been admitted without IV access.  Patient's air movement was reasonably good at arrival.  He was taken off of CPAP/BiPAP and weaned to nasal cannula after continuous albuterol  nebulizer.  Patient administered oral prednisone .  Chest x-ray without evidence of pneumonia.  Patient weaned down to his normal nasal cannula oxygen .  He appears comfortable.  Complaining of chronic back pain.  His morning medications including analgesia have been ordered.  Will recheck after he receives his meds to determine if he will be appropriate for discharge or needs admission.  Signout to oncoming ER physician to  recheck him.     Final diagnoses:  COPD exacerbation [facility])    ED Discharge Orders     None          Haze Lonni PARAS, MD 02/09/24 [phone]

## 2024-02-09 NOTE — H&P (Addendum)
 History and Physical   Grant Galvan FMW:[id number] DOB: 25-Jun-1960 DOA: 02/09/2024  PCP: Watt Harlene BROCKS, MD .  Patient coming from: Home  Chief Complaint: Shortness of breath  HPI: Grant Galvan is a 63 y.o. male with medical history significant of hypertension, hyperlipidemia, needle phobia, ascending thoracic aortic aneurysm, COPD, chronic respiratory failure with hypoxia, chronic pain presenting with worsening shortness of breath.  Patient has had several days of worsening shortness of breath.  Woke with significantly increased shortness of breath this morning.  EMS was called and patient received DuoNeb and CPAP and route.  Patient refused IV placement by EMS or in the ED.  Also refuses blood draws due to severe needle phobia.  Has had admissions without IV/blood draws in the past.  Has been counseled about the risks this poses.  Denies fevers, chills, chest pain, abdominal pain, constipation, diarrhea, nausea, vomiting.  ED Course: Vital signs in the ED notable for blood pressure in the 80s-100 systolic and heart rate in the 80s-100s.  Blood pressure at baseline is in the 80s-100s systolic.  No labs drawn in the ED due to severe needle phobia.  Chest x-ray showed hyperexpansion and scarring.  CT chest showed questionable new nodule associated with infection/inflammation changes.  Will need repeat CT in 1 month to rule out malignancy also noted with three-vessel CAD and 4.1 cm ascending aortic aneurysm.  Patient received oxycodone , doxycycline , prednisone , DuoNeb, albuterol  x 2, Incruse, Breo, transient BiPAP in the ED.  Continues to wheeze, request for observation to further treat COPD exacerbation in a patient where IV interventions are currently refused  Review of Systems: As per HPI otherwise all other systems reviewed and are negative.  Past Medical History:  Diagnosis Date   COPD (chronic obstructive pulmonary disease) (HCC)    Degenerative disc disease    reports  back fx x 3.    Past Surgical History:  Procedure Laterality Date   BACK SURGERY      Social History  reports that he has been smoking cigarettes. He has quit using smokeless tobacco. He reports that he does not currently use drugs. He reports that he does not drink alcohol.  Allergies  Allergen Reactions   Celebrex [Celecoxib] Other (See Comments)    Stopped breathing   Flexeril [Cyclobenzaprine] Hives   Tylenol  [Acetaminophen ] Other (See Comments)    Liver function   Ultram [Tramadol] Diarrhea    Family History  Problem Relation Age of Onset   Cancer Mother    Stroke Mother    COPD Father    Cancer Father 5       prostate   Cancer Brother 70       colon  Reviewed on admission  Prior to Admission medications   Medication Sig Start Date End Date Taking? Authorizing Provider  albuterol  (VENTOLIN  HFA) 108 (90 Base) MCG/ACT inhaler Inhale 2 puffs into the lungs every 6 (six) hours as needed for wheezing or shortness of breath. 05/12/23   Copland, Harlene BROCKS, MD  ALPRAZolam  (XANAX ) 0.5 MG tablet Take 1 tablet (0.5 mg total) by mouth 2 (two) times daily as needed for anxiety. 07/14/23   Samtani, Jai-Gurmukh, MD  amLODipine  (NORVASC ) 10 MG tablet Take 1 tablet (10 mg total) by mouth daily. 12/22/23   Copland, Harlene BROCKS, MD  clotrimazole -betamethasone  (LOTRISONE ) cream Apply 1 Application topically 2 (two) times daily. 07/07/23   Copland, Harlene BROCKS, MD  fluticasone  furoate-vilanterol (BREO ELLIPTA ) 100-25 MCG/ACT AEPB Inhale 1 puff into the lungs daily.  10/22/23   Copland, Harlene BROCKS, MD  guaiFENesin  (MUCINEX ) 600 MG 12 hr tablet Take 1 tablet (600 mg total) by mouth 2 (two) times daily. 07/14/23   Samtani, Jai-Gurmukh, MD  ipratropium-albuterol  (DUONEB) 0.5-2.5 (3) MG/3ML SOLN Take 3 mLs by nebulization every 4 (four) hours as needed. Can use 4-6 times daily as needed. Max 6 per 24 hours 09/12/23   Copland, Harlene BROCKS, MD  oxyCODONE  (ROXICODONE ) 5 MG immediate release tablet Take 0.5-1  tablets (2.5-5 mg total) by mouth every 4 (four) hours as needed for severe pain (pain score 7-10). Pt plans to fill on 01/27/24 01/20/24   Copland, Harlene BROCKS, MD  predniSONE  (DELTASONE ) 20 MG tablet Take 40 mg by mouth daily for 3 days, then 20 mg daily by mouth for 3 days if needed 02/05/24   Copland, Harlene BROCKS, MD  rosuvastatin  (CRESTOR ) 10 MG tablet Take 1 tablet (10 mg total) by mouth daily. 12/22/23   Copland, Harlene BROCKS, MD  triamcinolone  cream (KENALOG ) 0.5 % Apply topically 2 (two) times daily. Use as needed for rash on extremities 07/07/23   Copland, Harlene BROCKS, MD  umeclidinium bromide  (INCRUSE ELLIPTA ) 62.5 MCG/ACT AEPB Inhale 1 puff into the lungs daily. 06/03/23   CoplandHarlene BROCKS, MD    Physical Exam: Vitals:   02/09/24 1200 02/09/24 1201 02/09/24 1215 02/09/24 1245  BP: 98/81  91/72 90/67  Pulse: 93  95 87  Resp: 15  19 14   Temp:  97.9 F (36.6 C)    TempSrc:  Oral    SpO2: 100%  100% 100%  Weight:      Height:        Physical Exam Constitutional:      General: He is not in acute distress.    Appearance: Normal appearance.  HENT:     Head: Normocephalic and atraumatic.     Mouth/Throat:     Mouth: Mucous membranes are moist.     Pharynx: Oropharynx is clear.  Eyes:     Extraocular Movements: Extraocular movements intact.     Pupils: Pupils are equal, round, and reactive to light.  Cardiovascular:     Rate and Rhythm: Normal rate and regular rhythm.     Pulses: Normal pulses.     Heart sounds: Normal heart sounds.  Pulmonary:     Effort: Pulmonary effort is normal. No respiratory distress.     Breath sounds: Wheezing present.  Abdominal:     General: Bowel sounds are normal. There is no distension.     Palpations: Abdomen is soft.     Tenderness: There is no abdominal tenderness.  Musculoskeletal:        General: No swelling or deformity.  Skin:    General: Skin is warm and dry.  Neurological:     General: No focal deficit present.     Mental Status: Mental  status is at baseline.    Labs on Admission: I have personally reviewed following labs and imaging studies  CBC: No results for input(s): WBC, NEUTROABS, HGB, HCT, MCV, PLT in the last 168 hours.  Basic Metabolic Panel: No results for input(s): NA, K, CL, CO2, GLUCOSE, BUN, CREATININE, CALCIUM , MG, PHOS in the last 168 hours.  GFR: CrCl cannot be calculated (No successful lab value found.).  Liver Function Tests: No results for input(s): AST, ALT, ALKPHOS, BILITOT, PROT, ALBUMIN in the last 168 hours.  Urine analysis: No results found for: COLORURINE, APPEARANCEUR, LABSPEC, PHURINE, GLUCOSEU, HGBUR, BILIRUBINUR, KETONESUR, PROTEINUR, UROBILINOGEN, NITRITE, LEUKOCYTESUR  Radiological Exams on Admission: CT Chest Wo Contrast Result Date: 02/09/2024 CLINICAL DATA:  Shortness of breath, cough. EXAM: CT CHEST WITHOUT CONTRAST TECHNIQUE: Multidetector CT imaging of the chest was performed following the standard protocol without IV contrast. RADIATION DOSE REDUCTION: This exam was performed according to the departmental dose-optimization program which includes automated exposure control, adjustment of the mA and/or kV according to patient size and/or use of iterative reconstruction technique. COMPARISON:  01/03/2023. FINDINGS: Cardiovascular: Atherosclerotic calcification of the aorta and aortic valve with age advanced involvement of all 3 coronary arteries. Heart is at the upper limits of normal in size to mildly enlarged. No pericardial effusion. Enlarged pulmonic trunk. Ascending aorta measures 4.1 cm (coronal image 62). Mediastinum/Nodes: No pathologically enlarged mediastinal or axillary lymph nodes. Hilar regions are difficult to definitively evaluate without IV contrast. Esophagus is grossly unremarkable. Lungs/Pleura: Centrilobular and paraseptal emphysema. Linear scarring and volume loss in the upper lobes. Difficult to  exclude a superimposed nodule in the medial right upper lobe, measuring 8 x 10 mm (7/54), not well seen previously. Dependent atelectasis bilaterally, right greater than left. Mucoid impaction. 10 mm lingular nodule (7/110), unchanged from 03/29/2022. 4 mm posteromedial left lower lobe nodule (7/95), also unchanged. Similar juxta pleural nodularity. No pleural fluid. Minimal debris in the airway. Upper Abdomen: Visualized portions of the liver, adrenal glands, kidneys, spleen and stomach are grossly unremarkable. Musculoskeletal: Degenerative changes in the spine. Multiple thoracic compression deformities, as before. IMPRESSION: 1. Possible new nodule associated with postinfectious/postinflammatory scarring in the right upper lobe, measuring 9 mm. Recommend short-term follow-up CT chest with contrast in 1 month as malignancy cannot be excluded. If persistent on follow-up, consider PET in further evaluation. 2.  Age advanced three-vessel coronary artery calcification. 3. 4.1 cm ascending aortic aneurysm. Recommend annual imaging followup by CTA or MRA. This recommendation follows 2010 ACCF/AHA/AATS/ACR/ASA/SCA/SCAI/SIR/STS/SVM Guidelines for the Diagnosis and Management of Patients with Thoracic Aortic Disease. Circulation. 2010; 121: Z733-z630. Aortic aneurysm NOS (ICD10-I71.9). 4.  Aortic atherosclerosis (ICD10-I70.0). 5. Enlarged pulmonic trunk, indicative of pulmonary arterial hypertension. 6. Emphysema (ICD10-J43.9). Low-dose CT lung cancer screening is recommended for patients who are 87-57 years of age with a 20+ pack-year history of smoking and who are currently smoking or quit <=15 years ago. Electronically Signed   By: Newell Eke M.D.   On: 02/09/2024 11:52   DG Chest Port 1 View Result Date: 02/09/2024 CLINICAL DATA:  Shortness of breath. EXAM: PORTABLE CHEST 1 VIEW COMPARISON:  09/14/2023 FINDINGS: Lungs are hyperexpanded. Areas of architectural distortion/scarring noted in the upper lungs  bilaterally, similar to prior. No pulmonary edema or focal lung consolidation. No substantial pleural effusion. Cardiopericardial silhouette is at upper limits of normal for size. Telemetry leads overlie the chest. IMPRESSION: Hyperexpansion with chronic scarring in the upper lungs bilaterally. No acute cardiopulmonary findings. Electronically Signed   By: Camellia Candle M.D.   On: 02/09/2024 06:35   EKG: Independently reviewed.  Sinus rhythm at 94 beats minute.  Nonspecific T wave changes.  QTc prolonged at 512.  Assessment/Plan Active Problems:   History of hypertension   Hyperlipidemia   COPD (chronic obstructive pulmonary disease) (HCC)   Chronic respiratory failure with hypoxia (HCC)   COPD exacerbation (HCC)   CAD (coronary artery disease), on CT Scan   Abnormal QT interval present on electrocardiogram   COPD exacerbation Chronic respiratory failure with hypoxia. > Patient with worsening shortness of breath and severely worsened this morning.  Despite DuoNeb, CPAP, BiPAP, doxycycline , prednisone , albuterol  x 2, home  inhalers patient continues to have wheezing and shortness of breath. > As above patient declines IV intervention so we will treat with p.o. interventions and monitor for improvement. > Transiently on BiPAP but now back on 2 L.  Which is his home oxygen  level.  Continues to wheeze. 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  Refuses blood draws and IV. > Counseled on the severe risk this poses to his health and life as we do not know certain details of what is going on without access to the blood work and we are unable to offer the highest level of care without IV treatments.  Patient understands and is willing to risk deferring IV access/treatments at this time. - Treating with  imaging, noninvasive tests, p.o. meds  Prolonged QTc - On telemetry - AM EKG - P.o. magnesium  History of hypertension > BP in the 80s-100s systolic in the ED.  This has been previously the case during prior admission as well. - Hold amlodipine , consider discontinuation - IF further drop in BP, could consider midodrine  Abnormal CT > New nodule on CT presumed to be inflammatory/infectious related.  However recommendation is for 1 month follow-up to rule out malignancy. - Follow-up outpatient for repeat CT   Ascending aortic aneurysm > Redemonstrated on CT, 4.1 cm. - Outpatient follow-up  CAD > 3-vessel disease noted on CT - Noted, Problem added to chart - May benefit from outpatient Cardiology f/u for non-invasive testing to eval further   Chronic pain - Continue home oxycodone   Anxiety > Worse with SOB. On xanax  in the past. - Xanax  PRN  Hyperlipidemia - Continue home rosuvastatin   DVT prophylaxis: SCDs, fear needles Code Status:   Full Family Communication:  None on admission  Disposition Plan:   Patient is from:  Home  Anticipated DC to:  Home  Anticipated DC date:  1 to 2 days  Anticipated DC barriers: None  Consults called:  None Admission status:  Observation, progressive  Severity of Illness: The appropriate patient status for this patient is OBSERVATION. Observation status is judged to be reasonable and necessary in order to provide the required intensity of service to ensure the patient's safety. The patient's presenting symptoms, physical exam findings, and initial radiographic and laboratory data in the context of their medical condition is felt to place them at decreased risk for further clinical deterioration. Furthermore, it is anticipated that the patient will be medically stable for discharge from the hospital within 2 midnights of admission.    Marsa KATHEE Scurry MD [facility]  How to contact the [facility] Attending or Consulting provider 7A -  7P or covering provider during after hours 7P -7A, for this patient?   Check the care team in [facility] and look for a) attending/consulting [facility] provider listed and b) the [facility] team listed Log into [email or web address] and use [place]'s universal password to access. If you do not have the password, please contact the hospital operator. Locate the [facility] provider you are looking for under [facility] and page to a number that you can be directly reached. If you still have difficulty reaching the provider, please page the [facility] (Director on Call) for the Hospitalists listed on amion for assistance.  02/09/2024, 1:05 PM

## 2024-02-10 ENCOUNTER — Observation Stay (HOSPITAL_COMMUNITY)

## 2024-02-10 ENCOUNTER — Ambulatory Visit: Admitting: Internal Medicine

## 2024-02-10 DIAGNOSIS — J929 Pleural plaque without asbestos: Secondary | ICD-10-CM | POA: Diagnosis not present

## 2024-02-10 DIAGNOSIS — R0602 Shortness of breath: Secondary | ICD-10-CM | POA: Diagnosis not present

## 2024-02-10 DIAGNOSIS — I700 Atherosclerosis of aorta: Secondary | ICD-10-CM | POA: Diagnosis not present

## 2024-02-10 DIAGNOSIS — J441 Chronic obstructive pulmonary disease with (acute) exacerbation: Secondary | ICD-10-CM | POA: Diagnosis not present

## 2024-02-10 DIAGNOSIS — J439 Emphysema, unspecified: Secondary | ICD-10-CM | POA: Diagnosis not present

## 2024-02-10 LAB — RESPIRATORY PANEL BY PCR

## 2024-02-10 MED ORDER — IPRATROPIUM-ALBUTEROL 0.5-2.5 (3) MG/3ML IN SOLN
RESPIRATORY_TRACT | Status: AC
  Administered 2024-02-10: 3 mL via RESPIRATORY_TRACT
  Filled 2024-02-10: qty 3

## 2024-02-10 MED ORDER — IPRATROPIUM-ALBUTEROL 0.5-2.5 (3) MG/3ML IN SOLN
3.0000 mL | Freq: Four times a day (QID) | RESPIRATORY_TRACT | Status: DC
  Administered 2024-02-11: 3 mL via RESPIRATORY_TRACT
  Filled 2024-02-10 (×2): qty 3

## 2024-02-10 NOTE — TOC Initial Note (Signed)
 Transition of Care [facility]) - Initial/Assessment Note    Patient Details  Name: Grant [facility] MRN: [id number] Date of Birth: 06-25-1960  [facility] CM/SW Contact:    Grant Gell, RN Phone Number: 02/10/2024, 12:15 PM  Clinical Narrative:                  Grant Galvan patient at bedside. He states that he has [employer] for his home oxygen . He prefers to have tanks over the concentrator because he adjusts his oxygen  throughout the Grant. He has this method set up with [employer] and states they deliver him 30+ tanks a week. He states his insurance covers this and he is not interested in a concentrator set up.  He states that he has a mobility chair that he uses at home and has all needed DME.  He uses [facility] transportation with his mobility chair and wants PTAR for DC He is agreeable to [facility] services preferred not to have [facility] again and agreeable to [facility] who has accepted     Expected Discharge Plan: [facility] Barriers to Discharge: Continued Medical Work up   Patient Goals and CMS Choice Patient states their goals for this hospitalization and ongoing recovery are:: to go home CMS Medicare.gov Compare Post Acute Care list provided to:: Patient Choice offered to / list presented to : Patient      Expected Discharge Plan and Services   Discharge Planning Services: CM Consult Post Acute Care Choice: Home Health Living arrangements for the past 2 months: Single Family Home                 DME Arranged: N/A         HH Arranged: PT, OT HH Agency: [facility] Date [facility] Agency Contacted: 02/10/24 Time HH Agency Contacted: 1215 Representative spoke with at [facility] Agency: Amy  Prior Living Arrangements/Services Living arrangements for the past 2 months: Single Family Home Lives with:: Spouse              Current home services: DME (O2 with [employer] and ambulatory DME)    Activities of Daily Living   ADL Screening (condition at time of  admission) Independently performs ADLs?: Yes (appropriate for developmental age) Is the patient deaf or have difficulty hearing?: No Does the patient have difficulty seeing, even when wearing glasses/contacts?: No Does the patient have difficulty concentrating, remembering, or making decisions?: No  Permission Sought/Granted                  Emotional Assessment              Admission diagnosis:  COPD exacerbation (HCC) [J44.1] Patient Active Problem List   Diagnosis Date Noted   COPD (chronic obstructive pulmonary disease) (HCC) 02/09/2024   Chronic respiratory failure with hypoxia (HCC) 02/09/2024   COPD exacerbation (HCC) 02/09/2024   CAD (coronary artery disease), on CT Scan 02/09/2024   Abnormal QT interval present on electrocardiogram 02/09/2024   Chronic pain 07/11/2023   Acute on chronic respiratory failure (HCC) 07/11/2023   Hyperlipidemia 07/11/2023   Ascending aortic aneurysm 04/03/2022   Nicotine  abuse/Smoker 02/14/2022   Compression fracture of first lumbar vertebra (HCC) 02/14/2022   Avascular necrosis of bone of hip, right (HCC) 02/14/2022   Needle phobia 09/12/2014   Vertebral fracture 11/02/2013   History of hypertension 10/18/2013   Osteoarthritis 01/28/2008   OSTEOARTHROS UNSPEC GEN/LOC PELV REGION&THIGH 01/28/2008   HIP PAIN, RIGHT, CHRONIC 01/28/2008  PCP:  Grant Harlene BROCKS, MD Pharmacy:   [facility] - [place], [place] - [address] [place] [address] Phone: [phone] Fax: [phone]  [id number] - [place], [place] - [address] AT [facility] [address] [place] [address] Phone: [phone] Fax: [phone]  [id number] [place], [place] - [address] [facility] [address] [place] [address] Phone: [phone] Fax: [phone]  [facility] [address] [place]  [address] Phone: [phone] Fax: [phone]  [facility] - [place], [place] - [address] [facility] [address] [employer] [address] [place] [address] Phone: [phone] Fax: [phone]     Social Drivers of Health (SDOH) Social History: SDOH Screenings   Food Insecurity: No Food Insecurity (02/09/2024)  Housing: Low Risk  (02/09/2024)  Transportation Needs: No Transportation Needs (02/09/2024)  Utilities: Not At Risk (02/09/2024)  Tobacco Use: High Risk (02/09/2024)   SDOH Interventions:     Readmission Risk Interventions     No data to display

## 2024-02-10 NOTE — Evaluation (Addendum)
 Physical Therapy Evaluation Patient Details Name: Grant Galvan MRN: [id number] DOB: 11-Dec-1960 Today's Date: 02/10/2024  History of Present Illness  Pt is a 63 y.o. male admitted 11/10 with worsening SOB. Pt admitted with presumed COPD exacertion. Pt declining all labwork/IV due to needle phobia. PMH:  HTN, hyperlipidemia, needle phobia, ascending thoracic aortic aneurysm, COPD, chronic respiratory failure with hypoxia (2-3L at baseline), chronic pain   Clinical Impression  Pt admitted with above diagnosis. PTA pt lived at home with wife, mod I mobility with cane vs electric scooter. Pt currently with functional limitations due to the deficits listed below (see PT Problem List). On eval, pt demo mod I bed mobility. CGA transfers and amb 15' with cane. SpO2 94% on 3L. Pt anxious upon return to EOB, reporting SOB but SpO2 remained stable. Pt will benefit from acute skilled PT to increase their independence and safety with mobility to allow discharge. Post acute, recommend HHPT. No DME needs.          If plan is discharge home, recommend the following: Assistance with cooking/housework;A little help with bathing/dressing/bathroom;Help with stairs or ramp for entrance   Can travel by private vehicle        Equipment Recommendations None recommended by PT  Recommendations for Other Services       Functional Status Assessment Patient has had a recent decline in their functional status and demonstrates the ability to make significant improvements in function in a reasonable and predictable amount of time.     Precautions / Restrictions Precautions Precautions: Other (comment) Recall of Precautions/Restrictions: Intact Precaution/Restrictions Comments: watch sats      Mobility  Bed Mobility Overal bed mobility: Modified Independent             General bed mobility comments: using bed functions    Transfers Overall transfer level: Needs assistance Equipment used: Straight  cane Transfers: Sit to/from Stand Sit to Stand: Contact guard assist           General transfer comment: increased time to stabilize balance    Ambulation/Gait Ambulation/Gait assistance: Contact guard assist Gait Distance (Feet): 15 Feet Assistive device: Straight cane Gait Pattern/deviations: Step-through pattern, Decreased stride length Gait velocity: slow, guarded Gait velocity interpretation: <1.31 ft/sec, indicative of household ambulator   General Gait Details: Amb on 3L with SpO2 94%. Distance limited by fatigue.  Stairs            Wheelchair Mobility     Tilt Bed    Modified Rankin (Stroke Patients Only)       Balance Overall balance assessment: Mild deficits observed, not formally tested                                           Pertinent Vitals/Pain Pain Assessment Pain Assessment: No/denies pain    Home Living Family/patient expects to be discharged to:: Private residence Living Arrangements: Spouse/significant other Available Help at Discharge: Family;Available 24 hours/day Type of Home: Mobile home Home Access: Ramped entrance       Home Layout: One level Home Equipment: Agricultural Consultant (2 wheels);Rollator (4 wheels);Cane - single point;Electric scooter      Prior Function Prior Level of Function : Independent/Modified Independent;Driving             Mobility Comments: amb with SPC vs electric scooter ADLs Comments: sponge baths sitting on edge of tub, wife assists with ADLs  as needed     Extremity/Trunk Assessment   Upper Extremity Assessment Upper Extremity Assessment: Generalized weakness    Lower Extremity Assessment Lower Extremity Assessment: Generalized weakness    Cervical / Trunk Assessment Cervical / Trunk Assessment: Kyphotic  Communication   Communication Communication: No apparent difficulties    Cognition Arousal: Alert Behavior During Therapy: Anxious   PT - Cognitive impairments:  No apparent impairments                         Following commands: Intact       Cueing Cueing Techniques: Verbal cues     General Comments General comments (skin integrity, edema, etc.): Pt very anxious regarding mobility. Reports no falls. Pt self-limiting on gait distance.    Exercises     Assessment/Plan    PT Assessment Patient needs continued PT services  PT Problem List Decreased strength;Cardiopulmonary status limiting activity;Decreased activity tolerance;Decreased mobility       PT Treatment Interventions Therapeutic exercise;Gait training;Balance training;Functional mobility training;Therapeutic activities;Patient/family education    PT Goals (Current goals can be found in the Care Plan section)  Acute Rehab PT Goals Patient Stated Goal: home PT Goal Formulation: With patient Time For Goal Achievement: 02/24/24 Potential to Achieve Goals: Good    Frequency Min 2X/week     Co-evaluation               AM-PAC PT 6 Clicks Mobility  Outcome Measure Help needed turning from your back to your side while in a flat bed without using bedrails?: None Help needed moving from lying on your back to sitting on the side of a flat bed without using bedrails?: None Help needed moving to and from a bed to a chair (including a wheelchair)?: A Little Help needed standing up from a chair using your arms (e.g., wheelchair or bedside chair)?: A Little Help needed to walk in hospital room?: A Little Help needed climbing 3-5 steps with a railing? : A Lot 6 Click Score: 19    End of Session Equipment Utilized During Treatment: Oxygen  Activity Tolerance: Patient limited by fatigue Patient left: in bed;with call bell/phone within reach Nurse Communication: Mobility status PT Visit Diagnosis: Muscle weakness (generalized) (M62.81);Unsteadiness on feet (R26.81)    Time: 9244-9184 PT Time Calculation (min) (ACUTE ONLY): 20 min   Charges:   PT Evaluation $PT  Eval Low Complexity: 1 Low   PT General Charges $$ ACUTE PT VISIT: 1 Visit         Sari MATSU., PT  Office # [phone]   Erven Sari Shaker 02/10/2024, 12:03 PM

## 2024-02-10 NOTE — Care Management Obs Status (Signed)
 MEDICARE OBSERVATION STATUS NOTIFICATION   Patient Details  Name: Grant [facility] MRN: [id number] Date of Birth: [birth year]   Medicare Observation Status Notification Given:  Yes Obs letter signed and copy given.    Arleigh Dicola 02/10/2024, 12:42 PM

## 2024-02-10 NOTE — Progress Notes (Signed)
 PROGRESS NOTE                                                                                                                                                                                                             Patient Demographics:    Grant Galvan, is a 63 y.o. male, DOB - 29-Jul-1960, FMW:[id number]  Outpatient Primary MD for the patient is Copland, Harlene BROCKS, MD    LOS - 0  Admit date - 02/09/2024    Chief Complaint  Patient presents with   Shortness of Breath       Brief Narrative (HPI from H&P)   62 y.o. male with medical history significant of hypertension, hyperlipidemia, needle phobia, ascending thoracic aortic aneurysm, COPD, chronic respiratory failure with hypoxia, chronic pain presenting with worsening shortness of breath.   Patient has had several days of worsening shortness of breath.  Woke with significantly increased shortness of breath this morning.  EMS was called and patient received DuoNeb and CPAP and route.  Patient refused IV placement by EMS or in the ED.  Also refuses blood draws due to severe needle phobia.  Was admitted for acute on chronic hypoxic respiratory failure due to presumed COPD exacerbation and possible acute bronchitis.   Subjective:    [employer] today has, No headache, No chest pain, No abdominal pain - No Nausea, No new weakness tingling or numbness, mild cough and ongoing shortness of breath.   Assessment  & Plan :    Chronic hypoxic respiratory failure due to acute on chronic COPD exacerbation and possible acute bronchitis. > Patient with worsening shortness of breath and severely worsened this morning.  Despite DuoNeb, CPAP, BiPAP, doxycycline , prednisone , albuterol  x 2, home inhalers patient, wheezing has improved however he seems to have predominantly emphysematous disease, strictly counseled to abstain from smoking he is still doing it.  Continue present line of care,  supplemental oxygen  currently on 5 L, uses 3 at home.  And monitor intermittently at home he uses a face mask as well.  Will require close outpatient pulmonary follow-up postdischarge.  Follow respiratory viral panel.  Again refusing lab draws and IV placement.   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  Refuses blood draws and IV, discussed with him in detail on 02/10/2024.  Assumes all responsibility for any missed infection, abnormal electrolytes, disability or  death.   Prolonged QTc - Oral magnesium in the ER, cannot monitor labs as he refuses, monitor on telemetry, minimize QTc prolonging medications   History of hypertension > Currently stable off of medications   Abnormal CT > New nodule on CT presumed to be inflammatory/infectious related.  However recommendation is for 1 month follow-up to rule out malignancy.  Will have him follow-up with pulmonary within a week of discharge.  PCP to monitor. - Follow-up outpatient for repeat CT   Ascending aortic aneurysm > Redemonstrated on CT, 4.1 cm. - Outpatient follow-up   CAD > 3-vessel disease noted on CT - Noted, Problem added to chart - May benefit from outpatient Cardiology f/u for non-invasive testing to eval further    Chronic pain - Continue home oxycodone    Anxiety > Worse with SOB. On xanax  in the past. - Xanax  PRN   Hyperlipidemia - Continue home rosuvastatin          Condition - Extremely Guarded  Family Communication  : None present  Code Status : Full code  Consults  : Pulmonary  PUD Prophylaxis :     Procedures  :     CT scan. 1. Possible new nodule associated with postinfectious/postinflammatory scarring in the right upper lobe, measuring 9 mm. Recommend short-term follow-up CT chest with contrast in 1 month as malignancy cannot be excluded. If persistent on follow-up, consider PET in further evaluation. 2.  Age advanced three-vessel coronary artery calcification. 3. 4.1 cm ascending aortic aneurysm.  Recommend annual imaging followup by CTA or MRA. This recommendation follows 2010 ACCF/AHA/AATS/ACR/ASA/SCA/SCAI/SIR/STS/SVM Guidelines for the Diagnosis and Management of Patients with Thoracic Aortic Disease. Circulation. 2010; 121: Z733-z630. Aortic aneurysm NOS (ICD10-I71.9). 4.  Aortic atherosclerosis (ICD10-I70.0). 5. Enlarged pulmonic trunk, indicative of pulmonary arterial hypertension. 6. Emphysema (ICD10-J43.9). Low-dose CT lung cancer screening is recommended for patients who are 103-16 years of age with a 20+ pack-year history of smoking and who are currently smoking or quit <=15 years ago.       Disposition Plan  :    Status is: Observation  DVT Prophylaxis  :    SCDs Start: 02/09/24 1229   No results found for: PLT  Diet :  Diet Order             Diet regular Room service appropriate? Yes; Fluid consistency: Thin  Diet effective now                    Inpatient Medications  Scheduled Meds:  doxycycline   100 mg Oral Q12H   fluticasone  furoate-vilanterol  1 puff Inhalation Daily   ipratropium  0.5 mg Nebulization Q6H WA   predniSONE   40 mg Oral Q breakfast   rosuvastatin   10 mg Oral Daily   umeclidinium bromide   1 puff Inhalation Daily   Continuous Infusions: PRN Meds:.albuterol , ALPRAZolam , guaiFENesin , oxyCODONE , polyethylene glycol  Antibiotics  :    Anti-infectives (From admission, onward)    Start     Dose/Rate Route Frequency Ordered Stop   02/09/24 2200  doxycycline  (VIBRA -TABS) tablet 100 mg        100 mg Oral Every 12 hours 02/09/24 1244     02/09/24 0700  doxycycline  (VIBRA -TABS) tablet 100 mg        100 mg Oral  Once 02/09/24 0655 02/09/24 0728         Objective:   Vitals:   02/10/24 0210 02/10/24 0400 02/10/24 0446 02/10/24 0755  BP:  100/80  111/80  Pulse: 65  63 62 72  Resp: 16 15 16 18   Temp:  97.6 F (36.4 C)  98 F (36.7 C)  TempSrc:  Oral  Oral  SpO2: 99% 97% 97% 98%  Weight:      Height:        Wt Readings from Last  3 Encounters:  02/09/24 78 kg  09/14/23 79.4 kg  08/20/23 78.5 kg     Intake/Output Summary (Last 24 hours) at 02/10/2024 0938 Last data filed at 02/10/2024 0618 Gross per 24 hour  Intake --  Output 850 ml  Net -850 ml     Physical Exam  Awake Alert, No new F.N deficits, Normal affect [place].AT,PERRAL Supple Neck, No JVD,   Symmetrical Chest wall movement, minimal bilateral air movement with breaths, no wheezing RRR,No Gallops,Rubs or new Murmurs,  +ve B.Sounds, Abd Soft, No tenderness,   No Cyanosis, Clubbing or edema     RN pressure injury documentation:      Data Review:    No results for input(s): WBC, HGB, HCT, PLT, MCV, MCH, MCHC, RDW, LYMPHSABS, MONOABS, EOSABS, BASOSABS, BANDABS in the last 168 hours.  Invalid input(s): NEUTRABS, BANDSABD  No results for input(s): NA, K, CL, CO2, ANIONGAP, GLUCOSE, BUN, CREATININE, AST, ALT, ALKPHOS, BILITOT, ALBUMIN, CRP, DDIMER, PROCALCITON, LATICACIDVEN, INR, TSH, CORTISOL, HGBA1C, AMMONIA, BNP, MG, PHOS, CALCIUM  in the last 168 hours.  Invalid input(s): GFRCGP   No results for input(s): CRP, DDIMER, PROCALCITON, LATICACIDVEN, INR, TSH, CORTISOL, HGBA1C, AMMONIA, BNP, MG, CALCIUM  in the last 168 hours.  Invalid input(s): PHOSPHOROUS  --------------------------------------------------------------------------------------------------------------- No results found for: CHOL, HDL, LDLCALC, LDLDIRECT, TRIG, CHOLHDL  No results found for: HGBA1C No results for input(s): TSH, T4TOTAL, FREET4, T3FREE, THYROIDAB in the last 72 hours. No results for input(s): VITAMINB12, FOLATE, FERRITIN, TIBC, IRON, RETICCTPCT in the last 72 hours. ------------------------------------------------------------------------------------------------------------------ Cardiac Enzymes No results for input(s):  CKMB, TROPONINI, MYOGLOBIN in the last 168 hours.  Invalid input(s): CK  Micro Results Recent Results (from the past 240 hours)  Resp panel by RT-PCR (RSV, Flu A&B, Covid) Anterior Nasal Swab     Status: None   Collection Time: 02/09/24 12:44 PM   Specimen: Anterior Nasal Swab  Result Value Ref Range Status   SARS Coronavirus 2 by RT PCR NEGATIVE NEGATIVE Final   Influenza A by PCR NEGATIVE NEGATIVE Final   Influenza B by PCR NEGATIVE NEGATIVE Final    Comment: (NOTE) The Xpert Xpress SARS-CoV-2/FLU/RSV plus assay is intended as an aid in the diagnosis of influenza from Nasopharyngeal swab specimens and should not be used as a sole basis for treatment. Nasal washings and aspirates are unacceptable for Xpert Xpress SARS-CoV-2/FLU/RSV testing.  Fact Sheet for Patients: bloggercourse.com  Fact Sheet for Healthcare Providers: seriousbroker.it  This test is not yet approved or cleared by the [place]  FDA and has been authorized for detection and/or diagnosis of SARS-CoV-2 by FDA under an Emergency Use Authorization (EUA). This EUA will remain in effect (meaning this test can be used) for the duration of the COVID-19 declaration under Section 564(b)(1) of the Act, 21 U.S.C. section 360bbb-3(b)(1), unless the authorization is terminated or revoked.     Resp Syncytial Virus by PCR NEGATIVE NEGATIVE Final    Comment: (NOTE) Fact Sheet for Patients: bloggercourse.com  Fact Sheet for Healthcare Providers: seriousbroker.it  This test is not yet approved or cleared by the [place]  FDA and has been authorized for detection and/or diagnosis of SARS-CoV-2 by FDA under an Emergency Use Authorization (EUA). This EUA will  remain in effect (meaning this test can be used) for the duration of the COVID-19 declaration under Section 564(b)(1) of the Act, 21 U.S.C. section  360bbb-3(b)(1), unless the authorization is terminated or revoked.  Performed at [facility], [address], [place], [place] [address]     Radiology Report DG Chest Port 1 View Result Date: 02/10/2024 EXAM: 1 VIEW(S) XRAY OF THE CHEST 02/10/2024 08:15:00 AM COMPARISON: None available. CLINICAL HISTORY: SOB (shortness of breath) FINDINGS: LUNGS AND PLEURA: Emphysema. Biapical pleural thickening. Left suprahilar reticular opacities, unchanged, likely scarring. Nodular scarring in the right upper lung zone is also unchanged. No new airspace consolidation. No pleural effusion. No pneumothorax. HEART AND MEDIASTINUM: Aortic atherosclerosis with aortic tortuosity. No acute abnormality of the cardiac silhouette. BONES AND SOFT TISSUES: Multilevel degenerative disc disease of the spine. No acute osseous abnormality. IMPRESSION: 1. Emphysema. Otherwise, no acute cardiopulmonary abnormality. Electronically signed by: Rogelia Myers MD 02/10/2024 08:38 AM EST RP Workstation: [id number]   CT Chest Wo Contrast Result Date: 02/09/2024 CLINICAL DATA:  Shortness of breath, cough. EXAM: CT CHEST WITHOUT CONTRAST TECHNIQUE: Multidetector CT imaging of the chest was performed following the standard protocol without IV contrast. RADIATION DOSE REDUCTION: This exam was performed according to the departmental dose-optimization program which includes automated exposure control, adjustment of the mA and/or kV according to patient size and/or use of iterative reconstruction technique. COMPARISON:  01/03/2023. FINDINGS: Cardiovascular: Atherosclerotic calcification of the aorta and aortic valve with age advanced involvement of all 3 coronary arteries. Heart is at the upper limits of normal in size to mildly enlarged. No pericardial effusion. Enlarged pulmonic trunk. Ascending aorta measures 4.1 cm (coronal image 62). Mediastinum/Nodes: No pathologically enlarged mediastinal or axillary lymph nodes. Hilar regions are  difficult to definitively evaluate without IV contrast. Esophagus is grossly unremarkable. Lungs/Pleura: Centrilobular and paraseptal emphysema. Linear scarring and volume loss in the upper lobes. Difficult to exclude a superimposed nodule in the medial right upper lobe, measuring 8 x 10 mm (7/54), not well seen previously. Dependent atelectasis bilaterally, right greater than left. Mucoid impaction. 10 mm lingular nodule (7/110), unchanged from 03/29/2022. 4 mm posteromedial left lower lobe nodule (7/95), also unchanged. Similar juxta pleural nodularity. No pleural fluid. Minimal debris in the airway. Upper Abdomen: Visualized portions of the liver, adrenal glands, kidneys, spleen and stomach are grossly unremarkable. Musculoskeletal: Degenerative changes in the spine. Multiple thoracic compression deformities, as before. IMPRESSION: 1. Possible new nodule associated with postinfectious/postinflammatory scarring in the right upper lobe, measuring 9 mm. Recommend short-term follow-up CT chest with contrast in 1 month as malignancy cannot be excluded. If persistent on follow-up, consider PET in further evaluation. 2.  Age advanced three-vessel coronary artery calcification. 3. 4.1 cm ascending aortic aneurysm. Recommend annual imaging followup by CTA or MRA. This recommendation follows 2010 ACCF/AHA/AATS/ACR/ASA/SCA/SCAI/SIR/STS/SVM Guidelines for the Diagnosis and Management of Patients with Thoracic Aortic Disease. Circulation. 2010; 121: Z733-z630. Aortic aneurysm NOS (ICD10-I71.9). 4.  Aortic atherosclerosis (ICD10-I70.0). 5. Enlarged pulmonic trunk, indicative of pulmonary arterial hypertension. 6. Emphysema (ICD10-J43.9). Low-dose CT lung cancer screening is recommended for patients who are 30-10 years of age with a 20+ pack-year history of smoking and who are currently smoking or quit <=15 years ago. Electronically Signed   By: Newell Eke M.D.   On: 02/09/2024 11:52   DG Chest Port 1 View Result Date:  02/09/2024 CLINICAL DATA:  Shortness of breath. EXAM: PORTABLE CHEST 1 VIEW COMPARISON:  09/14/2023 FINDINGS: Lungs are hyperexpanded. Areas of architectural distortion/scarring noted in  the upper lungs bilaterally, similar to prior. No pulmonary edema or focal lung consolidation. No substantial pleural effusion. Cardiopericardial silhouette is at upper limits of normal for size. Telemetry leads overlie the chest. IMPRESSION: Hyperexpansion with chronic scarring in the upper lungs bilaterally. No acute cardiopulmonary findings. Electronically Signed   By: Camellia Candle M.D.   On: 02/09/2024 06:35     Signature  -   Lavada Stank M.D on 02/10/2024 at 9:38 AM   -  To page go to [email or web address]

## 2024-02-11 ENCOUNTER — Telehealth: Payer: Self-pay

## 2024-02-11 ENCOUNTER — Other Ambulatory Visit (HOSPITAL_COMMUNITY): Payer: Self-pay

## 2024-02-11 DIAGNOSIS — J441 Chronic obstructive pulmonary disease with (acute) exacerbation: Secondary | ICD-10-CM | POA: Diagnosis not present

## 2024-02-11 DIAGNOSIS — Z7401 Bed confinement status: Secondary | ICD-10-CM | POA: Diagnosis not present

## 2024-02-11 MED ORDER — DOXYCYCLINE HYCLATE 100 MG PO TABS
100.0000 mg | ORAL_TABLET | Freq: Two times a day (BID) | ORAL | 0 refills | Status: AC
  Filled 2024-02-11: qty 10, 5d supply, fill #0

## 2024-02-11 MED ORDER — PREDNISONE 10 MG (21) PO TBPK
ORAL_TABLET | ORAL | 0 refills | Status: DC
  Filled 2024-02-11: qty 21, 6d supply, fill #0

## 2024-02-11 MED ORDER — ALPRAZOLAM 0.5 MG PO TABS
0.5000 mg | ORAL_TABLET | Freq: Two times a day (BID) | ORAL | 0 refills | Status: DC | PRN
  Filled 2024-02-11: qty 15, 8d supply, fill #0

## 2024-02-11 NOTE — Telephone Encounter (Signed)
 BP Readings from Last 3 Encounters:  02/11/24 136/88  09/15/23 114/76  08/20/23 136/84   Called him back- they are holding his amlodipine  for now due to low BP in the hospital He does have a BP cuff- advised him to restart amlodipine  5 when his BP goes over 140/90 again  He is interested in getting some possible home health assistance- they are seeing me next month and I can do a referral then (needs to be within 30 days for medicare rules)

## 2024-02-11 NOTE — Discharge Instructions (Signed)
Follow with Primary MD Copland, Gay Filler, MD in 7 days   Get CBC, CMP,  checked  by Primary MD next visit.    Activity: As tolerated with Full fall precautions use walker/cane & assistance as needed   Disposition Home   Diet: Heart Healthy.   On your next visit with your primary care physician please Get Medicines reviewed and adjusted.   Please request your Prim.MD to go over all Hospital Tests and Procedure/Radiological results at the follow up, please get all Hospital records sent to your Prim MD by signing hospital release before you go home.   If you experience worsening of your admission symptoms, develop shortness of breath, life threatening emergency, suicidal or homicidal thoughts you must seek medical attention immediately by calling 911 or calling your MD immediately  if symptoms less severe.  You Must read complete instructions/literature along with all the possible adverse reactions/side effects for all the Medicines you take and that have been prescribed to you. Take any new Medicines after you have completely understood and accpet all the possible adverse reactions/side effects.   Do not drive, operating heavy machinery, perform activities at heights, swimming or participation in water activities or provide baby sitting services if your were admitted for syncope or siezures until you have seen by Primary MD or a Neurologist and advised to do so again.  Do not drive when taking Pain medications.    Do not take more than prescribed Pain, Sleep and Anxiety Medications  Special Instructions: If you have smoked or chewed Tobacco  in the last 2 yrs please stop smoking, stop any regular Alcohol  and or any Recreational drug use.  Wear Seat belts while driving.   Please note  You were cared for by a hospitalist during your hospital stay. If you have any questions about your discharge medications or the care you received while you were in the hospital after you are  discharged, you can call the unit and asked to speak with the hospitalist on call if the hospitalist that took care of you is not available. Once you are discharged, your primary care physician will handle any further medical issues. Please note that NO REFILLS for any discharge medications will be authorized once you are discharged, as it is imperative that you return to your primary care physician (or establish a relationship with a primary care physician if you do not have one) for your aftercare needs so that they can reassess your need for medications and monitor your lab values.

## 2024-02-11 NOTE — Telephone Encounter (Signed)
 Copied from CRM [id number]. Topic: Clinical - Medical Advice >> Feb 11, 2024 12:22 PM Drema MATSU wrote: Reason for CRM: Patient was rushed to the hospital on Monday and he just got back. He is requesting a callback from doctor regarding medication that they want him to discontinue including his blood pressure medication. He has questions and concerns about it.

## 2024-02-11 NOTE — Discharge Summary (Signed)
 Physician Discharge Summary  Grant Galvan FMW:[id number] DOB: 01-09-1961 DOA: 02/09/2024  PCP: Watt Harlene BROCKS, MD  Admit date: 02/09/2024 Discharge date: 02/11/2024  Admitted From: (Home) Disposition:  (Home )  Recommendations for Outpatient Follow-up:  Follow up with PCP in 1-2 weeks Please obtain BMP/CBC in one week Please resume amlodipine  once blood pressure started to increase. Patient will need repeat imaging as an outpatient to follow-up on finding of ascending aortic aneurysm and pulmonary nodule.  Home Health: (YES)  Diet recommendation: Heart Healthy / Carb Modified Brief/Interim Summary:   63 y.o. male with medical history significant of hypertension, hyperlipidemia, needle phobia, ascending thoracic aortic aneurysm, COPD, chronic respiratory failure with hypoxia, chronic pain presenting with worsening shortness of breath.   Patient has had several days of worsening shortness of breath.  Woke with significantly increased shortness of breath this morning.  EMS was called and patient received DuoNeb and CPAP and route.  Patient refused IV placement by EMS or in the ED.  Also refuses blood draws due to severe needle phobia.  Was admitted for acute on chronic hypoxic respiratory failure due to presumed COPD exacerbation and possible acute bronchitis.  Chronic hypoxic respiratory failure due to acute on chronic COPD exacerbation and possible acute bronchitis. - Patient with worsening shortness of breath and severely worsened this morning.  Despite DuoNeb, CPAP, BiPAP, doxycycline , prednisone , albuterol  x 2, home inhalers patient, wheezing has improved however he seems to have predominantly emphysematous disease, strictly counseled to abstain from smoking he is still doing it.  He is back on his baseline 3 L nasal cannula, this morning has good air entry, no significant wheezing appreciated, workup significant for negative respiratory panel, COVID, RSV and influenza, he will be  discharged on home medications and prednisone  taper, will add 5 days of doxycycline  as well. 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  Refuses blood draws and IV, discussed with him in detail on 02/10/2024.  Assumes all responsibility for any missed infection, abnormal electrolytes, disability or death.   Prolonged QTc - Oral magnesium in the ER, cannot monitor labs as he refuses, monitor on telemetry, minimize QTc prolonging medications   History of hypertension > Currently stable off of medications   Abnormal CT > New nodule on CT presumed to be inflammatory/infectious related.  However recommendation is for 1 month follow-up to rule out malignancy.  Will have him follow-up with pulmonary within a week of discharge.  PCP to monitor. - Follow-up outpatient for repeat CT   Ascending aortic aneurysm > Redemonstrated on CT, 4.1 cm. - Outpatient follow-up   CAD > 3-vessel disease noted on CT - Noted, Problem added to chart - May benefit from outpatient Cardiology f/u for non-invasive testing to eval further    Chronic pain - Continue home oxycodone   Hypertension -Blood pressure has been soft during hospital stay, so we will hold amlodipine  at time of discharge and this can be resumed later once blood pressure starts to increase.   Anxiety > Worse with SOB. On xanax  in the past. - Xanax  PRN   Hyperlipidemia - Continue home rosuvastatin     Discharge Diagnoses:  Active Problems:   History of hypertension   Hyperlipidemia   COPD (chronic obstructive pulmonary disease) (HCC)  Chronic respiratory failure with hypoxia (HCC)   COPD exacerbation (HCC)   CAD (coronary artery disease), on CT Scan   Abnormal QT interval present on  electrocardiogram    Discharge Instructions  Discharge Instructions     Diet - low sodium heart healthy   Complete by: As directed    Discharge instructions   Complete by: As directed    Follow with Primary MD Copland, Harlene BROCKS, MD in 7 days   Get CBC, CMP,  checked  by Primary MD next visit.    Activity: As tolerated with Full fall precautions use walker/cane & assistance as needed   Disposition Home   Diet: Heart Healthy    On your next visit with your primary care physician please Get Medicines reviewed and adjusted.   Please request your Prim.MD to go over all Hospital Tests and Procedure/Radiological results at the follow up, please get all Hospital records sent to your Prim MD by signing hospital release before you go home.   If you experience worsening of your admission symptoms, develop shortness of breath, life threatening emergency, suicidal or homicidal thoughts you must seek medical attention immediately by calling 911 or calling your MD immediately  if symptoms less severe.  You Must read complete instructions/literature along with all the possible adverse reactions/side effects for all the Medicines you take and that have been prescribed to you. Take any new Medicines after you have completely understood and accpet all the possible adverse reactions/side effects.   Do not drive, operating heavy machinery, perform activities at heights, swimming or participation in water activities or provide baby sitting services if your were admitted for syncope or siezures until you have seen by Primary MD or a Neurologist and advised to do so again.  Do not drive when taking Pain medications.    Do not take more than prescribed Pain, Sleep and Anxiety Medications  Special Instructions: If you have smoked or chewed Tobacco  in the last 2 yrs please stop smoking, stop any regular Alcohol  and or any Recreational drug use.  Wear Seat belts while driving.   Please  note  You were cared for by a hospitalist during your hospital stay. If you have any questions about your discharge medications or the care you received while you were in the hospital after you are discharged, you can call the unit and asked to speak with the hospitalist on call if the hospitalist that took care of you is not available. Once you are discharged, your primary care physician will handle any further medical issues. Please note that NO REFILLS for any discharge medications will be authorized once you are discharged, as it is imperative that you return to your primary care physician (or establish a relationship with a primary care physician if you do not have one) for your aftercare needs so that they can reassess your need for medications and monitor your lab values.   Increase activity slowly   Complete by: As directed       Allergies as of 02/11/2024       Reactions   Celebrex [celecoxib] Other (See Comments)   Stopped breathing   Flexeril [cyclobenzaprine] Hives   Tylenol  [acetaminophen ] Other (See Comments)   Liver function   Ultram [tramadol] Diarrhea        Medication List     STOP taking these medications    amLODipine  10 MG tablet Commonly known as: NORVASC    predniSONE  20 MG tablet Commonly known as: DELTASONE  Replaced  by: predniSONE  10 MG (21) Tbpk tablet       TAKE these medications    albuterol  108 (90 Base) MCG/ACT inhaler Commonly known as: VENTOLIN  HFA Inhale 2 puffs into the lungs every 6 (six) hours as needed for wheezing or shortness of breath. What changed: when to take this   ALPRAZolam  0.5 MG tablet Commonly known as: XANAX  Take 1 tablet (0.5 mg total) by mouth 2 (two) times daily as needed for anxiety.   clotrimazole -betamethasone  cream Commonly known as: LOTRISONE  Apply 1 Application topically 2 (two) times daily. What changed:  when to take this reasons to take this   doxycycline  100 MG tablet Commonly known as:  VIBRA -TABS Take 1 tablet (100 mg total) by mouth every 12 (twelve) hours for 5 days.   fluticasone  furoate-vilanterol 100-25 MCG/ACT Aepb Commonly known as: BREO ELLIPTA  Inhale 1 puff into the lungs daily.   guaiFENesin  600 MG 12 hr tablet Commonly known as: MUCINEX  Take 1 tablet (600 mg total) by mouth 2 (two) times daily.   Incruse Ellipta  62.5 MCG/ACT Aepb Generic drug: umeclidinium bromide  Inhale 1 puff into the lungs daily.   ipratropium-albuterol  0.5-2.5 (3) MG/3ML Soln Commonly known as: DUONEB Take 3 mLs by nebulization every 4 (four) hours as needed. Can use 4-6 times daily as needed. Max 6 per 24 hours   oxyCODONE  5 MG immediate release tablet Commonly known as: Roxicodone  Take 0.5-1 tablets (2.5-5 mg total) by mouth every 4 (four) hours as needed for severe pain (pain score 7-10). Pt plans to fill on 01/27/24 What changed:  how much to take when to take this additional instructions   predniSONE  10 MG (21) Tbpk tablet Commonly known as: STERAPRED UNI-PAK 21 TAB Please use per package instruction Replaces: predniSONE  20 MG tablet   rosuvastatin  10 MG tablet Commonly known as: Crestor  Take 1 tablet (10 mg total) by mouth daily.   triamcinolone  cream 0.5 % Commonly known as: KENALOG  Apply topically 2 (two) times daily. Use as needed for rash on extremities What changed:  how much to take when to take this reasons to take this        Follow-up Information     [facility] Follow up.   Contact information: [address] [place] [address] [phone]                Allergies  Allergen Reactions   Celebrex [Celecoxib] Other (See Comments)    Stopped breathing   Flexeril [Cyclobenzaprine] Hives   Tylenol  [Acetaminophen ] Other (See Comments)    Liver function   Ultram [Tramadol] Diarrhea    Consultations: None   Procedures/Studies: DG Chest Port 1 View Result Date: 02/10/2024 EXAM: 1 VIEW(S) XRAY OF THE  CHEST 02/10/2024 08:15:00 AM COMPARISON: None available. CLINICAL HISTORY: SOB (shortness of breath) FINDINGS: LUNGS AND PLEURA: Emphysema. Biapical pleural thickening. Left suprahilar reticular opacities, unchanged, likely scarring. Nodular scarring in the right upper lung zone is also unchanged. No new airspace consolidation. No pleural effusion. No pneumothorax. HEART AND MEDIASTINUM: Aortic atherosclerosis with aortic tortuosity. No acute abnormality of the cardiac silhouette. BONES AND SOFT TISSUES: Multilevel degenerative disc disease of the spine. No acute osseous abnormality. IMPRESSION: 1. Emphysema. Otherwise, no acute cardiopulmonary abnormality. Electronically signed by: Rogelia Myers MD 02/10/2024 08:38 AM EST RP Workstation: [id number]   CT Chest Wo Contrast Result Date: 02/09/2024 CLINICAL DATA:  Shortness of breath, cough. EXAM: CT CHEST WITHOUT CONTRAST TECHNIQUE: Multidetector CT imaging of the chest was performed  following the standard protocol without IV contrast. RADIATION DOSE REDUCTION: This exam was performed according to the departmental dose-optimization program which includes automated exposure control, adjustment of the mA and/or kV according to patient size and/or use of iterative reconstruction technique. COMPARISON:  01/03/2023. FINDINGS: Cardiovascular: Atherosclerotic calcification of the aorta and aortic valve with age advanced involvement of all 3 coronary arteries. Heart is at the upper limits of normal in size to mildly enlarged. No pericardial effusion. Enlarged pulmonic trunk. Ascending aorta measures 4.1 cm (coronal image 62). Mediastinum/Nodes: No pathologically enlarged mediastinal or axillary lymph nodes. Hilar regions are difficult to definitively evaluate without IV contrast. Esophagus is grossly unremarkable. Lungs/Pleura: Centrilobular and paraseptal emphysema. Linear scarring and volume loss in the upper lobes. Difficult to exclude a superimposed nodule in the  medial right upper lobe, measuring 8 x 10 mm (7/54), not well seen previously. Dependent atelectasis bilaterally, right greater than left. Mucoid impaction. 10 mm lingular nodule (7/110), unchanged from 03/29/2022. 4 mm posteromedial left lower lobe nodule (7/95), also unchanged. Similar juxta pleural nodularity. No pleural fluid. Minimal debris in the airway. Upper Abdomen: Visualized portions of the liver, adrenal glands, kidneys, spleen and stomach are grossly unremarkable. Musculoskeletal: Degenerative changes in the spine. Multiple thoracic compression deformities, as before. IMPRESSION: 1. Possible new nodule associated with postinfectious/postinflammatory scarring in the right upper lobe, measuring 9 mm. Recommend short-term follow-up CT chest with contrast in 1 month as malignancy cannot be excluded. If persistent on follow-up, consider PET in further evaluation. 2.  Age advanced three-vessel coronary artery calcification. 3. 4.1 cm ascending aortic aneurysm. Recommend annual imaging followup by CTA or MRA. This recommendation follows 2010 ACCF/AHA/AATS/ACR/ASA/SCA/SCAI/SIR/STS/SVM Guidelines for the Diagnosis and Management of Patients with Thoracic Aortic Disease. Circulation. 2010; 121: Z733-z630. Aortic aneurysm NOS (ICD10-I71.9). 4.  Aortic atherosclerosis (ICD10-I70.0). 5. Enlarged pulmonic trunk, indicative of pulmonary arterial hypertension. 6. Emphysema (ICD10-J43.9). Low-dose CT lung cancer screening is recommended for patients who are 57-10 years of age with a 20+ pack-year history of smoking and who are currently smoking or quit <=15 years ago. Electronically Signed   By: Newell Eke M.D.   On: 02/09/2024 11:52   DG Chest Port 1 View Result Date: 02/09/2024 CLINICAL DATA:  Shortness of breath. EXAM: PORTABLE CHEST 1 VIEW COMPARISON:  09/14/2023 FINDINGS: Lungs are hyperexpanded. Areas of architectural distortion/scarring noted in the upper lungs bilaterally, similar to prior. No pulmonary  edema or focal lung consolidation. No substantial pleural effusion. Cardiopericardial silhouette is at upper limits of normal for size. Telemetry leads overlie the chest. IMPRESSION: Hyperexpansion with chronic scarring in the upper lungs bilaterally. No acute cardiopulmonary findings. Electronically Signed   By: Camellia Candle M.D.   On: 02/09/2024 06:35      Subjective:  Afebrile, baseline on 3 L nasal cannula morning saturating 98% Discharge Exam: Vitals:   02/11/24 0700 02/11/24 0727  BP: 136/88   Pulse:    Resp:    Temp: 98.5 F (36.9 C)   SpO2:  100%   Vitals:   02/11/24 0000 02/11/24 0400 02/11/24 0700 02/11/24 0727  BP: 114/82 119/73 136/88   Pulse: (!) 58 (!) 47    Resp: 17 19    Temp: 98.6 F (37 C) 98.6 F (37 C) 98.5 F (36.9 C)   TempSrc: Oral Oral Oral   SpO2: 99% 99%  100%  Weight:      Height:        General: Pt is alert, awake, not in acute distress, anxious Cardiovascular: RRR,  S1/S2 +, no rubs, no gallops Respiratory: CTA bilaterally, no wheezing, no rhonchi Abdominal: Soft, NT, ND, bowel sounds + Extremities: no edema, no cyanosis    The results of significant diagnostics from this hospitalization (including imaging, microbiology, ancillary and laboratory) are listed below for reference.     Microbiology: Recent Results (from the past 240 hours)  Resp panel by RT-PCR (RSV, Flu A&B, Covid) Anterior Nasal Swab     Status: None   Collection Time: 02/09/24 12:44 PM   Specimen: Anterior Nasal Swab  Result Value Ref Range Status   SARS Coronavirus 2 by RT PCR NEGATIVE NEGATIVE Final   Influenza A by PCR NEGATIVE NEGATIVE Final   Influenza B by PCR NEGATIVE NEGATIVE Final    Comment: (NOTE) The Xpert Xpress SARS-CoV-2/FLU/RSV plus assay is intended as an aid in the diagnosis of influenza from Nasopharyngeal swab specimens and should not be used as a sole basis for treatment. Nasal washings and aspirates are unacceptable for Xpert Xpress  SARS-CoV-2/FLU/RSV testing.  Fact Sheet for Patients: bloggercourse.com  Fact Sheet for Healthcare Providers: seriousbroker.it  This test is not yet approved or cleared by the [place]  FDA and has been authorized for detection and/or diagnosis of SARS-CoV-2 by FDA under an Emergency Use Authorization (EUA). This EUA will remain in effect (meaning this test can be used) for the duration of the COVID-19 declaration under Section 564(b)(1) of the Act, 21 U.S.C. section 360bbb-3(b)(1), unless the authorization is terminated or revoked.     Resp Syncytial Virus by PCR NEGATIVE NEGATIVE Final    Comment: (NOTE) Fact Sheet for Patients: bloggercourse.com  Fact Sheet for Healthcare Providers: seriousbroker.it  This test is not yet approved or cleared by the [place]  FDA and has been authorized for detection and/or diagnosis of SARS-CoV-2 by FDA under an Emergency Use Authorization (EUA). This EUA will remain in effect (meaning this test can be used) for the duration of the COVID-19 declaration under Section 564(b)(1) of the Act, 21 U.S.C. section 360bbb-3(b)(1), unless the authorization is terminated or revoked.  Performed at [facility], [address] [place], [place] [address]   Respiratory (~20 pathogens) panel by PCR     Status: None   Collection Time: 02/09/24 12:44 PM   Specimen: Nasopharyngeal Swab; Respiratory  Result Value Ref Range Status   Adenovirus NOT DETECTED NOT DETECTED Final   Coronavirus 229E NOT DETECTED NOT DETECTED Final    Comment: (NOTE) The Coronavirus on the Respiratory Panel, DOES NOT test for the novel  Coronavirus (2019 nCoV)    Coronavirus HKU1 NOT DETECTED NOT DETECTED Final   Coronavirus NL63 NOT DETECTED NOT DETECTED Final   Coronavirus OC43 NOT DETECTED NOT DETECTED Final   Metapneumovirus NOT DETECTED NOT DETECTED Final    Rhinovirus / Enterovirus NOT DETECTED NOT DETECTED Final   Influenza A NOT DETECTED NOT DETECTED Final   Influenza B NOT DETECTED NOT DETECTED Final   Parainfluenza Virus 1 NOT DETECTED NOT DETECTED Final   Parainfluenza Virus 2 NOT DETECTED NOT DETECTED Final   Parainfluenza Virus 3 NOT DETECTED NOT DETECTED Final   Parainfluenza Virus 4 NOT DETECTED NOT DETECTED Final   Respiratory Syncytial Virus NOT DETECTED NOT DETECTED Final   Bordetella pertussis NOT DETECTED NOT DETECTED Final   Bordetella Parapertussis NOT DETECTED NOT DETECTED Final   Chlamydophila pneumoniae NOT DETECTED NOT DETECTED Final   Mycoplasma pneumoniae NOT DETECTED NOT DETECTED Final    Comment: Performed at [facility], [address], [place], [place] [address]     Labs: BNP (last 3 results) No results for input(s): BNP in the last 8760 hours. Basic Metabolic Panel: No results for input(s): NA, K, CL, CO2, GLUCOSE, BUN, CREATININE, CALCIUM , MG, PHOS in the last 168 hours. Liver Function Tests: No results for input(s): AST, ALT, ALKPHOS, BILITOT, PROT, ALBUMIN in the last 168 hours. No results for input(s): LIPASE, AMYLASE in the last 168 hours. No results for input(s): AMMONIA in the last 168 hours. CBC: No results for input(s): WBC, NEUTROABS, HGB, HCT, MCV, PLT in the last 168 hours. Cardiac Enzymes: No results for input(s): CKTOTAL, CKMB, CKMBINDEX, TROPONINI in the last 168 hours. BNP: Invalid input(s): POCBNP CBG: No results for input(s): GLUCAP in the last 168 hours. D-Dimer No results for input(s): DDIMER in the last 72 hours. Hgb A1c No results for input(s): HGBA1C in the last 72 hours. Lipid Profile No results for input(s): CHOL, HDL, LDLCALC, TRIG, CHOLHDL, LDLDIRECT in the last 72 hours. Thyroid function studies No results for input(s): TSH, T4TOTAL, T3FREE, THYROIDAB in the last 72 hours.  Invalid  input(s): FREET3 Anemia work up No results for input(s): VITAMINB12, FOLATE, FERRITIN, TIBC, IRON, RETICCTPCT in the last 72 hours. Urinalysis No results found for: COLORURINE, APPEARANCEUR, LABSPEC, PHURINE, GLUCOSEU, HGBUR, BILIRUBINUR, KETONESUR, PROTEINUR, UROBILINOGEN, NITRITE, LEUKOCYTESUR Sepsis Labs No results for input(s): WBC in the last 168 hours.  Invalid input(s): PROCALCITONIN, LACTICIDVEN Microbiology Recent Results (from the past 240 hours)  Resp panel by RT-PCR (RSV, Flu A&B, Covid) Anterior Nasal Swab     Status: None   Collection Time: 02/09/24 12:44 PM   Specimen: Anterior Nasal Swab  Result Value Ref Range Status   SARS Coronavirus 2 by RT PCR NEGATIVE NEGATIVE Final   Influenza A by PCR NEGATIVE NEGATIVE Final   Influenza B by PCR NEGATIVE NEGATIVE Final    Comment: (NOTE) The Xpert Xpress SARS-CoV-2/FLU/RSV plus assay is intended as an aid in the diagnosis of influenza from Nasopharyngeal swab specimens and should not be used as a sole basis for treatment. Nasal washings and aspirates are unacceptable for Xpert Xpress SARS-CoV-2/FLU/RSV testing.  Fact Sheet for Patients: bloggercourse.com  Fact Sheet for Healthcare Providers: seriousbroker.it  This test is not yet approved or cleared by the [place]  FDA and has been authorized for detection and/or diagnosis of SARS-CoV-2 by FDA under an Emergency Use Authorization (EUA). This EUA will remain in effect (meaning this test can be used) for the duration of the COVID-19 declaration under Section 564(b)(1) of the Act, 21 U.S.C. section 360bbb-3(b)(1), unless the authorization is terminated or revoked.     Resp Syncytial Virus by PCR NEGATIVE NEGATIVE Final    Comment: (NOTE) Fact Sheet for Patients: bloggercourse.com  Fact Sheet for Healthcare  Providers: seriousbroker.it  This test is not yet approved or cleared by the [place]  FDA and has been authorized for detection and/or diagnosis of SARS-CoV-2 by FDA under an Emergency Use Authorization (EUA). This EUA will remain in effect (meaning this test can be used) for the duration of the COVID-19 declaration under Section 564(b)(1) of the Act, 21 U.S.C. section 360bbb-3(b)(1), unless the authorization is terminated or revoked.  Performed at [facility], [address], [place], [place] [address]   Respiratory (~20 pathogens) panel by PCR     Status: None   Collection Time: 02/09/24 12:44 PM   Specimen: Nasopharyngeal Swab; Respiratory  Result Value Ref Range Status   Adenovirus NOT DETECTED NOT DETECTED Final  Coronavirus 229E NOT DETECTED NOT DETECTED Final    Comment: (NOTE) The Coronavirus on the Respiratory Panel, DOES NOT test for the novel  Coronavirus (2019 nCoV)    Coronavirus HKU1 NOT DETECTED NOT DETECTED Final   Coronavirus NL63 NOT DETECTED NOT DETECTED Final   Coronavirus OC43 NOT DETECTED NOT DETECTED Final   Metapneumovirus NOT DETECTED NOT DETECTED Final   Rhinovirus / Enterovirus NOT DETECTED NOT DETECTED Final   Influenza A NOT DETECTED NOT DETECTED Final   Influenza B NOT DETECTED NOT DETECTED Final   Parainfluenza Virus 1 NOT DETECTED NOT DETECTED Final   Parainfluenza Virus 2 NOT DETECTED NOT DETECTED Final   Parainfluenza Virus 3 NOT DETECTED NOT DETECTED Final   Parainfluenza Virus 4 NOT DETECTED NOT DETECTED Final   Respiratory Syncytial Virus NOT DETECTED NOT DETECTED Final   Bordetella pertussis NOT DETECTED NOT DETECTED Final   Bordetella Parapertussis NOT DETECTED NOT DETECTED Final   Chlamydophila pneumoniae NOT DETECTED NOT DETECTED Final   Mycoplasma pneumoniae NOT DETECTED NOT DETECTED Final    Comment: Performed at [facility], [address] [place], [place] [address]     Time  coordinating discharge: Over 30 minutes  SIGNED:   Brayton Lye, MD  [facility] 02/11/2024, 3:22 PM Pager   If 7PM-7AM, please contact night-coverage [email or web address] Password [email or web address]

## 2024-02-11 NOTE — Plan of Care (Signed)

## 2024-02-11 NOTE — TOC Transition Note (Signed)
 Transition of Care [facility]) - Discharge Note   Patient Details  Name: Grant Galvan MRN: [id number] Date of Birth: 11-20-1960  Transition of Care [facility]) CM/SW Contact:  Marval Gell, RN Phone Number: 02/11/2024, 9:41 AM   Clinical Narrative:     Leopoldo notified of DC. PTAR called, forms on chart, nursing staff aware.   Final next level of care: Home w Home Health Services Barriers to Discharge: No Barriers Identified   Patient Goals and CMS Choice Patient states their goals for this hospitalization and ongoing recovery are:: to go home CMS Medicare.gov Compare Post Acute Care list provided to:: Patient Choice offered to / list presented to : Patient      Discharge Placement                       Discharge Plan and Services Additional resources added to the After Visit Summary for     Discharge Planning Services: CM Consult Post Acute Care Choice: Home Health          DME Arranged: N/A         HH Arranged: PT, OT HH Agency: [facility] Date [facility] Agency Contacted: 02/10/24 Time HH Agency Contacted: 1215 Representative spoke with at [facility] Agency: Amy  Social Drivers of Health (SDOH) Interventions SDOH Screenings   Food Insecurity: No Food Insecurity (02/09/2024)  Housing: Low Risk  (02/09/2024)  Transportation Needs: No Transportation Needs (02/09/2024)  Utilities: Not At Risk (02/09/2024)  Tobacco Use: High Risk (02/09/2024)     Readmission Risk Interventions     No data to display

## 2024-02-13 DIAGNOSIS — J441 Chronic obstructive pulmonary disease with (acute) exacerbation: Secondary | ICD-10-CM | POA: Diagnosis not present

## 2024-02-13 DIAGNOSIS — E785 Hyperlipidemia, unspecified: Secondary | ICD-10-CM | POA: Diagnosis not present

## 2024-02-13 DIAGNOSIS — I7121 Aneurysm of the ascending aorta, without rupture: Secondary | ICD-10-CM | POA: Diagnosis not present

## 2024-02-13 DIAGNOSIS — I2510 Atherosclerotic heart disease of native coronary artery without angina pectoris: Secondary | ICD-10-CM | POA: Diagnosis not present

## 2024-02-13 DIAGNOSIS — Z7951 Long term (current) use of inhaled steroids: Secondary | ICD-10-CM | POA: Diagnosis not present

## 2024-02-13 DIAGNOSIS — J9611 Chronic respiratory failure with hypoxia: Secondary | ICD-10-CM | POA: Diagnosis not present

## 2024-02-13 DIAGNOSIS — F419 Anxiety disorder, unspecified: Secondary | ICD-10-CM | POA: Diagnosis not present

## 2024-02-13 DIAGNOSIS — M1990 Unspecified osteoarthritis, unspecified site: Secondary | ICD-10-CM | POA: Diagnosis not present

## 2024-02-13 DIAGNOSIS — Z7952 Long term (current) use of systemic steroids: Secondary | ICD-10-CM | POA: Diagnosis not present

## 2024-02-13 DIAGNOSIS — G8929 Other chronic pain: Secondary | ICD-10-CM | POA: Diagnosis not present

## 2024-02-16 ENCOUNTER — Encounter: Payer: Self-pay | Admitting: Family Medicine

## 2024-02-16 DIAGNOSIS — F410 Panic disorder [episodic paroxysmal anxiety] without agoraphobia: Secondary | ICD-10-CM

## 2024-02-16 DIAGNOSIS — M25559 Pain in unspecified hip: Secondary | ICD-10-CM

## 2024-02-16 DIAGNOSIS — J449 Chronic obstructive pulmonary disease, unspecified: Secondary | ICD-10-CM | POA: Diagnosis not present

## 2024-02-17 MED ORDER — OXYCODONE HCL 5 MG PO TABS: 2.5000 mg | ORAL_TABLET | ORAL | 0 refills | Status: DC | PRN

## 2024-02-17 MED ORDER — ALPRAZOLAM 0.5 MG PO TABS: 0.5000 mg | ORAL_TABLET | Freq: Every day | ORAL | 0 refills | Status: DC | PRN

## 2024-02-17 NOTE — Addendum Note (Signed)
 Addended by: WATT RAISIN C on: 02/17/2024 10:15 AM   Modules accepted: Orders

## 2024-02-18 ENCOUNTER — Telehealth: Payer: Self-pay | Admitting: Family Medicine

## 2024-02-18 NOTE — Telephone Encounter (Signed)
 Copied from CRM [id number]. Topic: Clinical - Home Health Verbal Orders >> Feb 18, 2024  2:09 PM Zebedee SAUNDERS wrote: Caller/Agency: [facility] per Darryle Rushing Number: [phone] secure line  Service Requested: Physical Therapy Frequency: 2 week for 4 weeks, 1x week for 4 weeks Any new concerns about the patient? No

## 2024-02-18 NOTE — Telephone Encounter (Signed)
 Called and LMOM- orders are ok

## 2024-02-27 ENCOUNTER — Encounter: Payer: Self-pay | Admitting: Family Medicine

## 2024-03-05 ENCOUNTER — Telehealth: Payer: Self-pay

## 2024-03-05 NOTE — Telephone Encounter (Signed)
 Copied from CRM #[id number]. Topic: General - Other >> Mar 05, 2024 12:54 PM Sophia H wrote: Reason for CRM: Spoke with Darryle GLENWOOD [facility] PT - stated they have had a hard time getting patient to commit to PT, moving to 1x a week for 5 weeks to see how things go. Will update via fax also

## 2024-03-05 NOTE — Telephone Encounter (Signed)
 FYI

## 2024-03-08 ENCOUNTER — Ambulatory Visit: Admitting: Internal Medicine

## 2024-03-09 NOTE — Progress Notes (Unsigned)
 [facility] [address] [place], [place] [address] [phone]  Date:  03/10/2024   Name:  Grant Galvan   DOB:  02-19-1961   MRN:  [id number]  PCP:  Watt Harlene BROCKS, MD    Chief Complaint: No chief complaint on file.   History of Present Illness:  Grant Galvan is a 63 y.o. 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  He was in the ER in June and was admitted last month for 2 nights Chronic hypoxic respiratory failure due to acute on chronic COPD exacerbation and possible acute bronchitis. - Patient with worsening shortness of breath and severely worsened this morning.  Despite DuoNeb, CPAP, BiPAP, doxycycline , prednisone , albuterol  x 2, home inhalers patient, wheezing has improved however he seems to have predominantly emphysematous disease, strictly counseled to abstain from smoking he is still doing it.  He is back on his baseline 3 L nasal cannula, this morning has good air entry, no significant wheezing appreciated, workup significant for negative respiratory panel, COVID, RSV and influenza, he will be discharged on home medications and prednisone  taper, will add 5 days of doxycycline  as well. - Patient with significant anxiety contributing to his symptoms and subjective feeling of  dyspnea despite good oxygen  saturation and work of breathing, he was described total 15 tablets of Xanax  as it seemed to work very well in controlling his symptoms  He was noted to have a lung nodule on CT last month, a 1 month follow-up was recommended CT chest dated 02/09/2024 IMPRESSION: 1. Possible new nodule associated with postinfectious/postinflammatory scarring in the right upper lobe, measuring 9 mm. Recommend short-term follow-up CT chest with contrast in 1 month as malignancy cannot be excluded. If persistent on follow-up, consider PET in further evaluation. 2.  Age advanced three-vessel coronary artery calcification. 3. 4.1 cm ascending aortic aneurysm. Recommend annual imaging followup by CTA or MRA. This recommendation follows 2010 ACCF/AHA/AATS/ACR/ASA/SCA/SCAI/SIR/STS/SVM Guidelines for the Diagnosis and Management of Patients with Thoracic Aortic Disease. Circulation. 2010; 121: Z733-z630. Aortic aneurysm NOS (ICD10-I71.9). 4.  Aortic atherosclerosis (ICD10-I70.0). 5. Enlarged pulmonic trunk, indicative of pulmonary arterial hypertension. 6. Emphysema (ICD10-J43.9). Low-dose CT lung cancer screening is recommended for patients who are 70-26 years of age with a 20+ pack-year history of smoking and who are currently smoking or quit <=15 years ago.  Discussed the use of AI scribe software for clinical note transcription with the patient, who gave verbal consent to proceed.  History of Present Illness     Patient Active Problem List   Diagnosis Date Noted   COPD (chronic obstructive pulmonary disease) (HCC) 02/09/2024   Chronic respiratory failure with hypoxia (HCC) 02/09/2024   COPD exacerbation (HCC) 02/09/2024   CAD (coronary artery disease), on CT Scan 02/09/2024   Abnormal QT interval  present on electrocardiogram 02/09/2024   Chronic pain 07/11/2023   Acute on chronic respiratory failure (HCC) 07/11/2023   Hyperlipidemia 07/11/2023   Ascending aortic  aneurysm 04/03/2022   Nicotine  abuse/Smoker 02/14/2022   Compression fracture of first lumbar vertebra (HCC) 02/14/2022   Avascular necrosis of bone of hip, right (HCC) 02/14/2022   Needle phobia 09/12/2014   Vertebral fracture 11/02/2013   History of hypertension 10/18/2013   Osteoarthritis 01/28/2008   OSTEOARTHROS UNSPEC GEN/LOC PELV REGION&THIGH 01/28/2008   HIP PAIN, RIGHT, CHRONIC 01/28/2008    Past Medical History:  Diagnosis Date   COPD (chronic obstructive pulmonary disease) (HCC)    Degenerative disc disease    reports back fx x 3.    Past Surgical History:  Procedure Laterality Date   BACK SURGERY      Social History   Tobacco Use   Smoking status: Every Day    Current packs/day: 0.50    Types: Cigarettes   Smokeless tobacco: Former  Building Services Engineer status: Never Used  Substance Use Topics   Alcohol use: No    Comment: gave up alcohol 1995.  hx heavy   Drug use: Not Currently    Family History  Problem Relation Age of Onset   Cancer Mother    Stroke Mother    COPD Father    Cancer Father 47       prostate   Cancer Brother 31       colon    Allergies  Allergen Reactions   Celebrex [Celecoxib] Other (See Comments)    Stopped breathing   Flexeril [Cyclobenzaprine] Hives   Tylenol  [Acetaminophen ] Other (See Comments)    Liver function   Ultram [Tramadol] Diarrhea    Medication list has been reviewed and updated.  Current Outpatient Medications on File Prior to Visit  Medication Sig Dispense Refill   albuterol  (VENTOLIN  HFA) 108 (90 Base) MCG/ACT inhaler Inhale 2 puffs into the lungs every 6 (six) hours as needed for wheezing or shortness of breath. (Patient taking differently: Inhale 2 puffs into the lungs 2 (two) times daily.) 18 g 5   ALPRAZolam  (XANAX ) 0.5 MG tablet Take 1 tablet (0.5 mg total) by mouth daily as needed for anxiety. 20 tablet 0   clotrimazole -betamethasone  (LOTRISONE ) cream Apply 1 Application topically 2 (two) times  daily. (Patient taking differently: Apply 1 Application topically 2 (two) times daily as needed (Irritation).) 45 g 2   fluticasone  furoate-vilanterol (BREO ELLIPTA ) 100-25 MCG/ACT AEPB Inhale 1 puff into the lungs daily. 60 each 8   guaiFENesin  (MUCINEX ) 600 MG 12 hr tablet Take 1 tablet (600 mg total) by mouth 2 (two) times daily. 60 tablet 0   ipratropium-albuterol  (DUONEB) 0.5-2.5 (3) MG/3ML SOLN Take 3 mLs by nebulization every 4 (four) hours as needed. Can use 4-6 times daily as needed. Max 6 per 24 hours 460 mL 3   oxyCODONE  (ROXICODONE ) 5 MG immediate release tablet Take 0.5-1 tablets (2.5-5 mg total) by mouth every 4 (four) hours as needed for severe pain (pain score 7-10). Pt plans to fill on 02/25/24 140 tablet 0   predniSONE  (STERAPRED UNI-PAK 21 TAB) 10 MG (21) TBPK tablet Please use per package instruction 21 tablet 0   rosuvastatin  (CRESTOR ) 10 MG tablet Take 1 tablet (10 mg total) by mouth daily. 90 tablet 3   triamcinolone  cream (KENALOG ) 0.5 % Apply topically 2 (two) times daily. Use as needed for rash on extremities (Patient taking differently: Apply 1 Application topically 2 (two) times  daily as needed (Rash). Use as needed for rash on extremities) 30 g 1   umeclidinium bromide  (INCRUSE ELLIPTA ) 62.5 MCG/ACT AEPB Inhale 1 puff into the lungs daily. 90 each 9   No current facility-administered medications on file prior to visit.    Review of Systems:  As per HPI- otherwise negative.   Physical Examination: There were no vitals filed for this visit. There were no vitals filed for this visit. There is no height or weight on file to calculate BMI. Ideal Body Weight:    GEN: no acute distress. HEENT: Atraumatic, Normocephalic.  Ears and Nose: No external deformity. CV: RRR, No M/G/R. No JVD. No thrill. No extra heart sounds. PULM: CTA B, no wheezes, crackles, rhonchi. No retractions. No resp. distress. No accessory muscle use. ABD: S, NT, ND, +BS. No rebound. No  HSM. EXTR: No c/c/e PSYCH: Normally interactive. Conversant.    Assessment and Plan: No diagnosis found.  Assessment & Plan   Signed Harlene Schroeder, MD

## 2024-03-10 ENCOUNTER — Encounter: Payer: Self-pay | Admitting: Family Medicine

## 2024-03-10 ENCOUNTER — Ambulatory Visit: Admitting: Family Medicine

## 2024-03-10 ENCOUNTER — Telehealth: Payer: Self-pay

## 2024-03-10 VITALS — BP 148/92 | HR 73 | Temp 97.8°F | Resp 16 | Ht 75.0 in

## 2024-03-10 DIAGNOSIS — R911 Solitary pulmonary nodule: Secondary | ICD-10-CM | POA: Diagnosis not present

## 2024-03-10 DIAGNOSIS — Z7951 Long term (current) use of inhaled steroids: Secondary | ICD-10-CM | POA: Diagnosis not present

## 2024-03-10 DIAGNOSIS — I10 Essential (primary) hypertension: Secondary | ICD-10-CM | POA: Diagnosis not present

## 2024-03-10 DIAGNOSIS — J449 Chronic obstructive pulmonary disease, unspecified: Secondary | ICD-10-CM | POA: Diagnosis not present

## 2024-03-10 DIAGNOSIS — M25559 Pain in unspecified hip: Secondary | ICD-10-CM | POA: Diagnosis not present

## 2024-03-10 DIAGNOSIS — G8929 Other chronic pain: Secondary | ICD-10-CM | POA: Diagnosis not present

## 2024-03-10 DIAGNOSIS — I2510 Atherosclerotic heart disease of native coronary artery without angina pectoris: Secondary | ICD-10-CM | POA: Diagnosis not present

## 2024-03-10 DIAGNOSIS — J441 Chronic obstructive pulmonary disease with (acute) exacerbation: Secondary | ICD-10-CM | POA: Diagnosis not present

## 2024-03-10 DIAGNOSIS — F419 Anxiety disorder, unspecified: Secondary | ICD-10-CM | POA: Diagnosis not present

## 2024-03-10 DIAGNOSIS — E785 Hyperlipidemia, unspecified: Secondary | ICD-10-CM | POA: Diagnosis not present

## 2024-03-10 DIAGNOSIS — M25551 Pain in right hip: Secondary | ICD-10-CM | POA: Diagnosis not present

## 2024-03-10 DIAGNOSIS — J9611 Chronic respiratory failure with hypoxia: Secondary | ICD-10-CM | POA: Diagnosis not present

## 2024-03-10 DIAGNOSIS — M1990 Unspecified osteoarthritis, unspecified site: Secondary | ICD-10-CM | POA: Diagnosis not present

## 2024-03-10 DIAGNOSIS — Z7952 Long term (current) use of systemic steroids: Secondary | ICD-10-CM | POA: Diagnosis not present

## 2024-03-10 DIAGNOSIS — I7121 Aneurysm of the ascending aorta, without rupture: Secondary | ICD-10-CM | POA: Diagnosis not present

## 2024-03-10 MED ORDER — AMLODIPINE BESYLATE 5 MG PO TABS: 5.0000 mg | ORAL_TABLET | Freq: Every day | ORAL | 3 refills | Status: AC

## 2024-03-10 MED ORDER — PREDNISONE 20 MG PO TABS: ORAL_TABLET | ORAL | 0 refills | Status: DC

## 2024-03-10 MED ORDER — OXYCODONE HCL 5 MG PO TABS: 5.0000 mg | ORAL_TABLET | ORAL | 0 refills | Status: DC | PRN

## 2024-03-10 NOTE — Telephone Encounter (Signed)
 Plan of care signed and faxed back to [facility] at [phone]. Form sent for scanning.

## 2024-03-10 NOTE — Patient Instructions (Signed)
 I will call [facility] and find out what they need I increased your oxycodone  to #160 per month  Please be sure to have your repeat chest CT at [facility] in the next couple of weeks

## 2024-03-13 ENCOUNTER — Encounter: Payer: Self-pay | Admitting: Family Medicine

## 2024-03-13 DIAGNOSIS — F410 Panic disorder [episodic paroxysmal anxiety] without agoraphobia: Secondary | ICD-10-CM

## 2024-03-15 MED ORDER — ALPRAZOLAM 0.5 MG PO TABS: 0.5000 mg | ORAL_TABLET | Freq: Every day | ORAL | 0 refills | Status: DC | PRN

## 2024-03-16 ENCOUNTER — Telehealth: Payer: Self-pay

## 2024-03-16 NOTE — Progress Notes (Unsigned)
 Complex Care Management Note Care Guide Note  03/16/2024 Name: Grant Galvan MRN: [id number] DOB: 03-11-1961   Complex Care Management Outreach Attempts: An unsuccessful telephone outreach was attempted today to offer the patient information about available complex care management services.  Follow Up Plan:  Additional outreach attempts will be made to offer the patient complex care management information and services.   Encounter Outcome:  No Answer  Dreama [facility]  [facility], [facility] VBCI Assistant Direct Dial: [phone]  Fax: [phone]

## 2024-03-19 NOTE — Progress Notes (Signed)
 Complex Care Management Note  Care Guide Note 03/19/2024 Name: [name] MRN: [id number] DOB: 08/03/1960  [name] Mcniel is a 63 y.o. year old male who sees Copland, Harlene BROCKS, MD for primary care. I reached out to [name] Copa by phone today to offer complex care management services.  Mr. Fuhrmann was given information about Complex Care Management services today including:   The Complex Care Management services include support from the care team which includes your Nurse Care Manager, Clinical Social Worker, or Pharmacist.  The Complex Care Management team is here to help remove barriers to the health concerns and goals most important to you. Complex Care Management services are voluntary, and the patient may decline or stop services at any time by request to their care team member.   Complex Care Management Consent Status: Patient agreed to services and verbal consent obtained.   Follow up plan:  Telephone appointment with complex care management team member scheduled for:  03/26/24 & 04/02/24.  Encounter Outcome:  Patient Scheduled  Dreama [facility]  [facility], [facility] VBCI Assistant Direct Dial: [phone]  Fax: [phone]

## 2024-03-26 ENCOUNTER — Other Ambulatory Visit: Payer: Self-pay

## 2024-03-26 ENCOUNTER — Ambulatory Visit: Payer: Self-pay

## 2024-03-26 ENCOUNTER — Telehealth: Payer: Self-pay | Admitting: Licensed Clinical Social Worker

## 2024-03-26 DIAGNOSIS — J438 Other emphysema: Secondary | ICD-10-CM

## 2024-03-26 NOTE — Patient Outreach (Signed)
 Phone call to Ppt at scheduled time to complete assessment. He informed that had received a call from his PCP earlier today. He asked that CSW call him back at 3pm as he had company present. CSW agreed to do so.

## 2024-03-26 NOTE — Patient Outreach (Signed)
 Complex Care Management   Visit Note  03/26/2024  Name:  Grant Galvan MRN: [id number] DOB: 06/27/1960  Situation: Referral received for Complex Care Management related to COPD and SDOH Barriers:  Housing - Assisted Lving or Senior Housing;Food insecurity. I obtained verbal consent from Patient.  Visit completed with Patient  on the phone.  Background:   Past Medical History:  Diagnosis Date   COPD (chronic obstructive pulmonary disease) (HCC)    Degenerative disc disease    reports back fx x 3.    Assessment: Phone call to Patient to discuss concerns related to relationship concerns and SDOH needs.  Pt intends to meet with legal representation on Wed of next week regarding his relationship concerns. He is asking for assistance with seeking housing either Senior Housing or Assisted Living facility. He is also requesting more resources regarding food/grocery resources. Referral made to BSW for community resources and guidance.   Patient Reported Symptoms:  Cognitive Cognitive Status: No symptoms reported, Alert and oriented to person, place, and time Cognitive/Intellectual Conditions Management [RPT]: None reported or documented in medical history or problem list   Health Facilitated by:  (self talk)  Neurological Neurological Review of Symptoms: No symptoms reported    HEENT HEENT Symptoms Reported: No symptoms reported (Hearing has been a long time ongoing problem but normally i hear just fine)      Cardiovascular Cardiovascular Symptoms Reported: No symptoms reported Does patient have uncontrolled Hypertension?: Yes (Sometimes) Is patient checking Blood Pressure at home?: Yes Patient's Recent BP reading at home: 132/71 day before yesterday Cardiovascular Management Strategies: Medication therapy, Coping strategies Cardiovascular Comment: take medication and sit down and rest if it's high  Respiratory Respiratory Symptoms Reported: Shortness of breath Other Respiratory  Symptoms: When moving around too much or  a spell - use nebulizer, oxygen  and medications Additional Respiratory Details: COPD/emphysema Respiratory Management Strategies: Medication therapy Respiratory Self-Management Outcome: 4 (good) (trying to quite smoking)  Endocrine Endocrine Symptoms Reported: No symptoms reported    Gastrointestinal Gastrointestinal Symptoms Reported: No symptoms reported      Genitourinary Genitourinary Symptoms Reported: No symptoms reported    Integumentary Additional Integumentary Details: patch of psiorasis on lower left calf Skin Management Strategies: Medication therapy Skin Comment: use prescribed cream - worse in winter goes away in the summer  Musculoskeletal Musculoskelatal Symptoms Reviewed: Back pain, Limited mobility Additional Musculoskeletal Details: and hip pain - osteoperosis        Psychosocial Psychosocial Symptoms Reported: Anxiety - if selected complete GAD Additional Psychological Details: reports of ongoing relationship concerns Behavioral Management Strategies: Medication therapy, Coping strategies Behavioral Health Comment: Pt declines referral for counseling   Quality of Family Relationships: stressful ( I'd have a spell, she is not trying to help me) Do you feel physically threatened by others?: No    03/26/2024    PHQ2-9 Depression Screening   Little interest or pleasure in doing things Several days  Feeling down, depressed, or hopeless Several days  PHQ-2 - Total Score 2  Trouble falling or staying asleep, or sleeping too much Several days (trouble sleeping at night d/t pain but i get enough sleep)  Feeling tired or having little energy Not at all (I get week sometimes when there is spell when cant breathe)  Poor appetite or overeating  Not at all  Feeling bad about yourself - or that you are a failure or have let yourself or your family down Not at all  Trouble concentrating on things, such as reading the  newspaper or  watching television Not at all  Moving or speaking so slowly that other people could have noticed.  Or the opposite - being so fidgety or restless that you have been moving around a lot more than usual Not at all  Thoughts that you would be better off dead, or hurting yourself in some way Not at all  PHQ2-9 Total Score 3  If you checked off any problems, how difficult have these problems made it for you to do your work, take care of things at home, or get along with other people Not difficult at all  Depression Interventions/Treatment PHQ2-9 Score <4 Follow-up Not Indicated (feels that xanax  helps his panic attacks does not feel like he needs to be on anything right now but will talk to his MD if needed)    There were no vitals filed for this visit. Pain Scale: 0-10 Pain Score: 9  Pain Type: Chronic pain Pain Location: Back Pain Orientation: Lower, Mid Pain Descriptors / Indicators: Throbbing Pain Onset: Progressive Patients Stated Pain Goal: 2 Pain Intervention(s): Medication (See eMAR) Multiple Pain Sites: Yes  Medications Reviewed Today     Reviewed by Angelena Finis HERO, LCSW (Social Worker) on 03/26/24 at 1539  Med List Status: <None>   Medication Order Taking? Sig Documenting Provider Last Dose Status Informant  albuterol  (VENTOLIN  HFA) 108 (90 Base) MCG/ACT inhaler [id number] Yes Inhale 2 puffs into the lungs every 6 (six) hours as needed for wheezing or shortness of breath. Copland, Harlene BROCKS, MD  Active Self, Pharmacy Records  ALPRAZolam  (XANAX ) 0.5 MG tablet [id number] Yes Take 1 tablet (0.5 mg total) by mouth daily as needed for anxiety. Copland, Harlene BROCKS, MD  Active   amLODipine  (NORVASC ) 5 MG tablet [id number] Yes Take 1 tablet (5 mg total) by mouth daily. Copland, Harlene BROCKS, MD  Active   clotrimazole -betamethasone  (LOTRISONE ) cream [id number] Yes Apply 1 Application topically 2 (two) times daily. Copland, Harlene BROCKS, MD  Active Self, Pharmacy Records  fluticasone   furoate-vilanterol (BREO ELLIPTA ) 100-25 MCG/ACT AEPB [id number] Yes Inhale 1 puff into the lungs daily. Copland, Harlene BROCKS, MD  Active Self, Pharmacy Records  guaiFENesin  (MUCINEX ) 600 MG 12 hr tablet [id number] Yes Take 1 tablet (600 mg total) by mouth 2 (two) times daily. Samtani, Jai-Gurmukh, MD  Active Self, Pharmacy Records  ipratropium-albuterol  (DUONEB) 0.5-2.5 (3) MG/3ML SOLN [id number] Yes Take 3 mLs by nebulization every 4 (four) hours as needed. Can use 4-6 times daily as needed. Max 6 per 24 hours Copland, Harlene BROCKS, MD  Active Self, Pharmacy Records  oxyCODONE  (ROXICODONE ) 5 MG immediate release tablet [id number] Yes Take 1-1.5 tablets (5-7.5 mg total) by mouth every 4 (four) hours as needed for severe pain (pain score 7-10). Pt plans to fill on 02/25/24 Copland, Harlene BROCKS, MD  Active   predniSONE  (DELTASONE ) 20 MG tablet [id number] Yes Take 40 mg by mouth daily for 3- 4 days as needed for COPD exacerbation Copland, Harlene BROCKS, MD  Active   rosuvastatin  (CRESTOR ) 10 MG tablet [id number] Yes Take 1 tablet (10 mg total) by mouth daily. Copland, Harlene BROCKS, MD  Active Self, Pharmacy Records  triamcinolone  cream (KENALOG ) 0.5 % [id number]  Apply topically 2 (two) times daily. Use as needed for rash on extremities  Patient not taking: Reported on 03/26/2024   Copland, Harlene BROCKS, MD  Active Self, Pharmacy Records  umeclidinium bromide  (INCRUSE ELLIPTA ) 62.5 MCG/ACT AEPB [id number] Yes Inhale 1 puff into the lungs daily. Copland, Harlene BROCKS, MD  Active Self,  Pharmacy Records            Recommendation:   PCP Follow-up Referral to: BSW for SDOH needs Referral for counseling-  Patient declined  Follow Up Plan:   Referral to BSW Follow up phone call with LCSW scheduled for Jan. 9, 2026 at 10:30 am.   Murray Shawl, LCSW [place] [facility], [facility] Licensed Clinical Social Worker Direct Dial : [phone]

## 2024-03-26 NOTE — Addendum Note (Signed)
 Addended by: ANGELENA FINIS HERO on: 03/26/2024 06:26 PM   Modules accepted: Orders

## 2024-03-26 NOTE — Patient Instructions (Signed)
 Visit Information  Thank you for taking time to visit with me today. Please don't hesitate to contact me if I can be of assistance to you before our next scheduled appointment.  Our next appointment is by telephone on 04/09/2024 at 10:30 am. Please call the care guide team at [phone] if you need to cancel or reschedule your appointment.   Following is a copy of your care plan:   Goals Addressed             This Visit's Progress    VBCI Social Work Care Plan LCSW       Problems:   Family and relationship dysfunction and Geophysicist/field Seismologist , Housing  CSW Clinical Goal(s):   Over the next 90 days the Patient will explore community resource options for unmet needs related to [employer]  and Housing as evidenced by [place] chart documentation and Patient report during LCSW follow up phone call.  Over the next 30 days, LCSW will provide some community resources to patient by way of email per his request and refer to BSW for follow up to provide additional community resource information and guidance as evidenced by LCSW and BSW's chart documentation.  Interventions:  Level of Care Concerns in a patient with COPD Current level of care: Home with significant other    Evaluation of patient's unmet needs in current living environment ADL's Assessed needs, level of care concerns, how currently meeting needs and barriers to care  Facility  Discussed Special Assistance Medicaid Emotional Support Provided Provided a list of facilities based on level of care needs   Social Determinants of Health in Patient with COPD: SDOH assessments completed: Food Insecurity  and Housing  Evaluation of current treatment plan related to unmet needs Referral to BSW   Patient Goals/Self-Care Activities:  Continue taking your medication as prescribed.   Coordinate with BSW to assist with Housing and [employer] .  Plan:   Telephone follow up appointment with care management team member  scheduled for:  BSW - April 06, 2024 at 1:30 PM and LCSW follow up phone call scheduled for April 09, 2024 at 10:30 AM.         Please call the Suicide and Crisis Lifeline: 988 call the [place]  National Suicide Prevention Lifeline: 959-856-4790 or TTY: 8381904263 TTY 607 552 6551) to talk to a trained counselor call 1-800-273-TALK (toll free, 24 hour hotline) go to [facility] [address], [place] [phone]) call 911 if you are experiencing a Mental Health or Behavioral Health Crisis or need someone to talk to.  Patient verbalized understanding of Care plan and visit instructions communicated this visit.  Murray Shawl, LCSW  [facility], [facility] Licensed Clinical Social Worker Direct Dial : [phone]
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
## 2024-03-26 NOTE — Telephone Encounter (Signed)
 I called patient back.  Basically he does want to know if I could arrange to have him placed in an assisted living facility.  I advised him that unfortunately no, he would need to call his preferred facility and start there.  He states that he does not need anything else at this time

## 2024-03-31 ENCOUNTER — Ambulatory Visit (HOSPITAL_COMMUNITY)

## 2024-03-31 ENCOUNTER — Telehealth: Payer: Self-pay | Admitting: Family Medicine

## 2024-03-31 NOTE — Telephone Encounter (Signed)
 Form has been fax, received confirmation.

## 2024-03-31 NOTE — Telephone Encounter (Signed)
 Copied from CRM [id number]. Topic: Clinical - Order For Equipment >> Mar 31, 2024 11:26 AM Eva FALCON wrote: Reason for CRM: Pt wife states she got ahold of [facility] and they need the form that Dr. Watt received signed and fax back for this patient to be able to get the oxygen  he is needing. Please call [phone] for any other questions or concerns.

## 2024-04-02 ENCOUNTER — Other Ambulatory Visit: Payer: Self-pay

## 2024-04-02 ENCOUNTER — Encounter: Payer: Self-pay | Admitting: Family Medicine

## 2024-04-02 DIAGNOSIS — J209 Acute bronchitis, unspecified: Secondary | ICD-10-CM

## 2024-04-02 MED ORDER — DOXYCYCLINE HYCLATE 100 MG PO CAPS: 100.0000 mg | ORAL_CAPSULE | Freq: Two times a day (BID) | ORAL | 0 refills | Status: AC

## 2024-04-02 NOTE — Patient Outreach (Signed)
 Complex Care Management   Visit Note  04/02/2024  Name:  Grant Galvan MRN: [id number] DOB: 04-Sep-1960  Situation: Referral received for Complex Care Management related to COPD and chronic pain I obtained verbal consent from Patient.  Visit completed with patient's wife Grant Galvan per patient request  on the phone  Background:   Past Medical History:  Diagnosis Date   COPD (chronic obstructive pulmonary disease) (HCC)    Degenerative disc disease    reports back fx x 3.    Assessment: Patient Reported Symptoms:  Cognitive Cognitive Status: Able to follow simple commands, Alert and oriented to person, place, and time, Normal speech and language skills Cognitive/Intellectual Conditions Management [RPT]: None reported or documented in medical history or problem list   Health Maintenance Behaviors: Annual physical exam Health Facilitated by: Rest  Neurological Neurological Review of Symptoms: No symptoms reported    HEENT HEENT Symptoms Reported: No symptoms reported      Cardiovascular Cardiovascular Symptoms Reported: No symptoms reported Does patient have uncontrolled Hypertension?: Yes Is patient checking Blood Pressure at home?: Yes Patient's Recent BP reading at home: 145/72 Cardiovascular Management Strategies: Medication therapy, Routine screening Cardiovascular Comment: Patient reports taking Amlodipine  PRN for SBP >150. Last dose a little over a week ago  Respiratory Respiratory Symptoms Reported: Shortness of breath, Other:, Chest tightness, Productive cough Other Respiratory Symptoms: not anything unusual, yellow gunk mucus production Additional Respiratory Details: Patient wears oxygen  via [place] at 1.5-5L continuously. Patient's wife reports he is currently on 5L. She reports they have 32 empty tanks and are waiting for [facility] to come pick them up. She last heard from [employer] at 10 AM today, who stated they would be coming out today. Patient's wife reports  compliance with daily inhalers and last nebulizer use prior to visit. Patient's wife reports PRN nebulizer did help provide relief of chest tightness. Patient does check oxygen  levels at home, most recent 94% on 3.5L. Patient's wife reports he took last dose of Prednisone  today. They are aware abx have just been ordered by PCP due to reported symptoms. Note per chart review patient has upcoming visit with pulmonology 04/15/24 Respiratory Management Strategies: Oxygen  therapy, Medication therapy, Routine screening, Coping strategies  Endocrine Endocrine Symptoms Reported: No symptoms reported Is patient diabetic?: No    Gastrointestinal Gastrointestinal Symptoms Reported: No symptoms reported Additional Gastrointestinal Details: Patient's wife reports a good appetite. Last BM today      Genitourinary Genitourinary Symptoms Reported: No symptoms reported    Integumentary Integumentary Symptoms Reported: Other Other Integumentary Symptoms: psoriasis LLE, using cream prescribed by PCP Skin Management Strategies: Medication therapy  Musculoskeletal Musculoskelatal Symptoms Reviewed: Back pain, Joint pain, Limited mobility Additional Musculoskeletal Details: Note per chart review patient is waiting to be scheduled with an orthopedic provider due to chronic right hip pain. Patient's wife reports that patient would not proceed with surgery or injections if indicated due to fear of needles, it would be useless to see orthopedic provider. Pain medication provides a little bit of relief Musculoskeletal Management Strategies: Coping strategies, Medication therapy, Routine screening Musculoskeletal Comment: Patient's wife reports he has been working with [facility] PT once a week. Patient also does exercises on days that they do not come Falls in the past year?: No Number of falls in past year: 1 or less Was there an injury with Fall?: No Fall Risk Category Calculator: 0 Patient Fall Risk Level: Low Fall  Risk Patient at Risk for Falls Due to: Impaired mobility, Medication side effect Fall  risk Follow up: Falls evaluation completed, Education provided, Falls prevention discussed  Psychosocial Psychosocial Symptoms Reported: Anxiety - if selected complete GAD Additional Psychological Details: Patient's wife reports Xanax  is effective in managing anxiety when taken as needed Behavioral Management Strategies: Coping strategies, Medication therapy Major Change/Loss/Stressor/Fears (CP): Relationship concerns Techniques to Cope with Loss/Stress/Change: Medication Quality of Family Relationships: unable to assess (unable to assess due to speaking with patient's wife) Do you feel physically threatened by others?:  (unable to assess due to speaking with patient's wife)    04/02/2024    PHQ2-9 Depression Screening   Little interest or pleasure in doing things    Feeling down, depressed, or hopeless    PHQ-2 - Total Score    Trouble falling or staying asleep, or sleeping too much    Feeling tired or having little energy    Poor appetite or overeating     Feeling bad about yourself - or that you are a failure or have let yourself or your family down    Trouble concentrating on things, such as reading the newspaper or watching television    Moving or speaking so slowly that other people could have noticed.  Or the opposite - being so fidgety or restless that you have been moving around a lot more than usual    Thoughts that you would be better off dead, or hurting yourself in some way    PHQ2-9 Total Score    If you checked off any problems, how difficult have these problems made it for you to do your work, take care of things at home, or get along with other people    Depression Interventions/Treatment      There were no vitals filed for this visit. Pain Scale: 0-10 Pain Score: 9  Pain Type: Chronic pain Pain Location: Back  Medications Reviewed Today     Reviewed by Grant Rosaline SQUIBB, RN  (Registered Nurse) on 04/02/24 at 1311  Med List Status: <None>   Medication Order Taking? Sig Documenting Provider Last Dose Status Informant  albuterol  (VENTOLIN  HFA) 108 (90 Base) MCG/ACT inhaler [id number] Yes Inhale 2 puffs into the lungs every 6 (six) hours as needed for wheezing or shortness of breath. Copland, Harlene BROCKS, MD  Active Self, Pharmacy Records  ALPRAZolam  (XANAX ) 0.5 MG tablet [id number]  Take 1 tablet (0.5 mg total) by mouth daily as needed for anxiety. Copland, Harlene BROCKS, MD  Active   amLODipine  (NORVASC ) 5 MG tablet [id number]  Take 1 tablet (5 mg total) by mouth daily. Copland, Harlene BROCKS, MD  Active   clotrimazole -betamethasone  (LOTRISONE ) cream [id number]  Apply 1 Application topically 2 (two) times daily. Copland, Harlene BROCKS, MD  Active Self, Pharmacy Records  doxycycline  (VIBRAMYCIN ) 100 MG capsule [id number]  Take 1 capsule (100 mg total) by mouth 2 (two) times daily. Copland, Harlene BROCKS, MD  Active   fluticasone  furoate-vilanterol (BREO ELLIPTA ) 100-25 MCG/ACT AEPB [id number] Yes Inhale 1 puff into the lungs daily. Copland, Harlene BROCKS, MD  Active Self, Pharmacy Records  guaiFENesin  (MUCINEX ) 600 MG 12 hr tablet [id number]  Take 1 tablet (600 mg total) by mouth 2 (two) times daily. Samtani, Jai-Gurmukh, MD  Active Self, Pharmacy Records  ipratropium-albuterol  (DUONEB) 0.5-2.5 (3) MG/3ML SOLN [id number] Yes Take 3 mLs by nebulization every 4 (four) hours as needed. Can use 4-6 times daily as needed. Max 6 per 24 hours Copland, Harlene BROCKS, MD  Active Self, Pharmacy Records  oxyCODONE  (ROXICODONE ) 5 MG immediate release tablet [id number]  Take 1-1.5  tablets (5-7.5 mg total) by mouth every 4 (four) hours as needed for severe pain (pain score 7-10). Pt plans to fill on 02/25/24 Copland, Harlene BROCKS, MD  Active   predniSONE  (DELTASONE ) 20 MG tablet [id number] Yes Take 40 mg by mouth daily for 3- 4 days as needed for COPD exacerbation Copland, Harlene BROCKS, MD  Active   rosuvastatin  (CRESTOR ) 10 MG  tablet [id number]  Take 1 tablet (10 mg total) by mouth daily. Copland, Harlene BROCKS, MD  Active Self, Pharmacy Records  triamcinolone  cream (KENALOG ) 0.5 % [id number]  Apply topically 2 (two) times daily. Use as needed for rash on extremities  Patient not taking: Reported on 03/26/2024   Copland, Harlene BROCKS, MD  Active Self, Pharmacy Records  umeclidinium bromide  (INCRUSE ELLIPTA ) 62.5 MCG/ACT AEPB [id number] Yes Inhale 1 puff into the lungs daily. Copland, Harlene BROCKS, MD  Active Self, Pharmacy Records            Recommendation:   Specialty provider follow-up pulmonology 04/15/24 Continue Current Plan of Care Pick up antibiotics as ordered by PCP  Follow Up Plan:   Telephone follow up appointment date/time:  04/16/24 at 12 PM  Rosaline Finlay, RN MSN [place]  [facility] RN Care Manager Direct Dial: [phone]  Fax: [phone]

## 2024-04-02 NOTE — Patient Instructions (Signed)
 Visit Information  Thank you for taking time to visit with me today. Please don't hesitate to contact me if I can be of assistance to you before our next scheduled appointment.  Our next appointment is by telephone on 04/16/24 at 12 PM Please call the care guide team at [phone] if you need to cancel or reschedule your appointment.   Following is a copy of your care plan:   Goals Addressed             This Visit's Progress    VBCI RN Care Plan   On track    Problems:  Chronic Disease Management support and education needs related to COPD and chronic pain  Goal: Over the next 30 days the Patient will demonstrate a decrease COPD in exacerbations as evidenced by patient report of stable respiratory symptoms, not requiring increased oxygen  or PRN medication use work with PT to minimize pain and optimize strength and mobility as evidenced by review of electronic medical record and patient or care team member report    Interventions:   COPD Interventions: Advised patient to engage in light exercise as tolerated 3-5 days a week to aid in the the management of COPD Advised patient to self assesses COPD action plan zone and make appointment with provider if in the yellow zone for 48 hours without improvement Discussed the importance of adequate rest and management of fatigue with COPD Provided instruction about proper use of medications used for management of COPD including inhalers Use of home oxygen  Reviewed upcoming provider appointments including pulmonology 04/15/24 Advised patient's wife that antibiotics were ordered by PCP today, and advised to pick up from pharmacy as soon as they are ready. Emphasized importance of completing full course of antibiotics, even if starting to feel better   Pain Interventions: Pain assessment performed Medications reviewed Counseled on the importance of reporting any/all new or changed pain symptoms or management strategies to pain management  provider Reviewed with patient prescribed pharmacological and nonpharmacological pain relief strategies Discussed outstanding referral for orthopedic provider. Patient's wife reports it would be useless due to patient's fear of needles. She reports he would not proceed with injections or surgery if indicated for pain. PCP currently prescribing pain medication, which provides some relief. Patient is also working with PT  Patient Self-Care Activities:  Attend all scheduled provider appointments Call provider office for new concerns or questions  Perform all self care activities independently  Take medications as prescribed   Work with the social worker to address care coordination needs and will continue to work with the clinical team to address health care and disease management related needs identify and remove indoor air pollutants limit outdoor activity during cold weather do breathing exercises every day develop a rescue plan follow rescue plan if symptoms flare-up  Plan:  Telephone follow up appointment with care management team member scheduled for:  04/16/24 at 12 PM                Please call the Suicide and Crisis Lifeline: 988 call 1-800-273-TALK (toll free, 24 hour hotline) if you are experiencing a Mental Health or Behavioral Health Crisis or need someone to talk to.  Patient's spouse verbalized understanding of Care plan and visit instructions communicated this visit  Rosaline Finlay, RN MSN [place]  [facility] RN Care Manager Direct Dial: [phone]  Fax: [phone]  COPD Action Plan A COPD action plan is a description of what to do when you have a flare (exacerbation)  of chronic obstructive pulmonary disease (COPD). Your action plan is a color-coded plan that lists the symptoms that indicate whether your condition is under control and what actions to take. If you have symptoms in the green zone, it means you are doing well that day. If  you have symptoms in the yellow zone, it means you are having a bad day or an exacerbation. If you have symptoms in the red zone, you need urgent medical care. Follow the plan that you and your health care provider developed. Review your plan with your health care provider at each visit. Red zone Symptoms in this zone mean that you should get medical help right away. They include: Feeling very short of breath, even when you are resting. Not being able to do any activities because of poor breathing. Not being able to sleep because of poor breathing. Fever or shaking chills. Feeling confused or very sleepy. Chest pain. Coughing up blood. If you have any of these symptoms, call emergency services (911 in the [place] or go to the nearest emergency room. Yellow zone Symptoms in this zone mean that your condition may be getting worse. They include: Feeling more short of breath than usual. Having less energy for daily activities than usual. Phlegm or mucus that is thicker than usual. Needing to use your rescue inhaler or nebulizer more often than usual. More ankle swelling than usual. Coughing more than usual. Feeling like you have a chest cold. Trouble sleeping due to COPD symptoms. Decreased appetite. COPD medicines not helping as much as usual. If you experience any yellow symptoms: Keep taking your daily medicines as directed. Use your quick-relief inhaler as told by your health care provider. If you were prescribed steroid medicine to take by mouth (oral medicine), start taking it as told by your health care provider. If you were prescribed an antibiotic medicine, start taking it as told by your health care provider. Do not stop taking the antibiotic even if you start to feel better. Use oxygen  as told by your health care provider. Get more rest. Do your pursed-lip breathing exercises. Do not smoke. Avoid any irritants in the air. If your signs and symptoms do not improve after taking  these steps, call your health care provider right away. Green zone Symptoms in this zone mean that you are doing well. They include: Being able to do your usual activities and exercise. Having the usual amount of coughing, including the same amount of phlegm or mucus. Being able to sleep well. Having a good appetite. Where to find more information: You can find more information about COPD from: American Lung Association, My COPD Action Plan: www.lung.org COPD Foundation: www.copdfoundation.org National Heart, Lung, & Blood Institute: popsteam.is Follow these instructions at home: Continue taking your daily medicines as told by your health care provider. Make sure you receive all the immunizations that your health care provider recommends, especially the pneumococcal and influenza vaccines. Wash your hands often with soap and water. Have family members wash their hands too. Regular hand washing can help prevent infections. Follow your usual exercise and diet plan. Avoid irritants in the air, such as smoke. Do not use any products that contain nicotine  or tobacco. These products include cigarettes, chewing tobacco, and vaping devices, such as e-cigarettes. If you need help quitting, ask your health care provider. Summary A COPD action plan tells you what to do when you have a flare (exacerbation) of chronic obstructive pulmonary disease (COPD). Follow each action plan for your symptoms. If  you have any symptoms in the red zone, call emergency services (911 in the [place] or go to the nearest emergency room. This information is not intended to replace advice given to you by your health care provider. Make sure you discuss any questions you have with your health care provider. Document Revised: 01/30/2023 Document Reviewed: 01/30/2023 Elsevier Patient Education  2024 [employer].

## 2024-04-06 ENCOUNTER — Telehealth: Payer: Self-pay | Admitting: Licensed Clinical Social Worker

## 2024-04-06 ENCOUNTER — Telehealth: Payer: Self-pay

## 2024-04-06 ENCOUNTER — Encounter: Payer: Self-pay | Admitting: Licensed Clinical Social Worker

## 2024-04-06 NOTE — Telephone Encounter (Signed)
 Okay to give verbal order.

## 2024-04-06 NOTE — Telephone Encounter (Signed)
 Copied from CRM [id number]. Topic: Clinical - Home Health Verbal Orders >> Apr 06, 2024  4:14 PM Wess RAMAN wrote: Caller/Agency: [facility]/ [facility] Callback Number: [phone] Service Requested: Physical therapist would like to move discharge visit to next week January 11-17  Any new concerns about the patient? No

## 2024-04-07 NOTE — Telephone Encounter (Signed)
Called and gave VO 

## 2024-04-08 ENCOUNTER — Encounter: Payer: Self-pay | Admitting: Family Medicine

## 2024-04-08 DIAGNOSIS — M25559 Pain in unspecified hip: Secondary | ICD-10-CM

## 2024-04-08 DIAGNOSIS — F410 Panic disorder [episodic paroxysmal anxiety] without agoraphobia: Secondary | ICD-10-CM

## 2024-04-08 MED ORDER — ALPRAZOLAM 0.5 MG PO TABS: 0.5000 mg | ORAL_TABLET | Freq: Every day | ORAL | 0 refills | Status: AC | PRN

## 2024-04-08 MED ORDER — OXYCODONE HCL 5 MG PO TABS: 5.0000 mg | ORAL_TABLET | ORAL | 0 refills | Status: AC | PRN

## 2024-04-09 ENCOUNTER — Telehealth: Payer: Self-pay

## 2024-04-09 NOTE — Patient Instructions (Signed)
 Elsie DASEN Haris - I am sorry I was unable to reach you today for our scheduled appointment. I work with Copland, Harlene BROCKS, MD and am calling to support your healthcare needs. Please contact me at [phone] at your earliest convenience. I look forward to speaking with you soon.   Thank you,   Murray Shawl, LCSW [facility], [facility] Licensed Clinical Social Worker Direct Dial : [phone]

## 2024-04-12 ENCOUNTER — Telehealth: Payer: Self-pay

## 2024-04-12 NOTE — Progress Notes (Unsigned)
 Complex Care Management Note Care Guide Note  04/12/2024 Name: Grant Galvan MRN: [id number] DOB: July 28, 1960   Complex Care Management Outreach Attempts: An unsuccessful telephone outreach was attempted today to offer the patient information about available complex care management services.  Follow Up Plan:  Additional outreach attempts will be made to offer the patient complex care management information and services.   Encounter Outcome:  No Answer  Dreama [facility]  [facility], [facility] VBCI Assistant Direct Dial: [phone]  Fax: [phone]

## 2024-04-13 NOTE — Progress Notes (Signed)
 Complex Care Management Note Care Guide Note  04/13/2024 Name: Grant Galvan MRN: [id number] DOB: October 23, 1960   Complex Care Management Outreach Attempts: A second unsuccessful outreach was attempted today to offer the patient with information about available complex care management services.  Follow Up Plan:  No further outreach attempts will be made at this time. We have been unable to contact the patient to offer or enroll patient in complex care management services.  Encounter Outcome:  No Answer  Dreama [facility]  [facility], [facility] VBCI Assistant Direct Dial: [phone]  Fax: [phone]

## 2024-04-15 ENCOUNTER — Ambulatory Visit: Admitting: Internal Medicine

## 2024-04-15 NOTE — Progress Notes (Unsigned)
 "   Grant Galvan, male    DOB: 1960/07/05    MRN: [id number]   Brief patient profile:  64  yo*** *** referred to pulmonary clinic in [place]  04/15/2024 by *** for ***   Pt not previously seen by PCCM service.     History of Present Illness  04/15/2024  Pulmonary/ 1st office eval/ [facility] Bastos / [place] [facility]  No chief complaint on file.    Dyspnea:  *** Cough: *** Sleep: *** SABA use: *** 02: *** LDSCT:***  No obvious day to day or daytime pattern/variability or assoc excess/ purulent sputum or mucus plugs or hemoptysis or cp or chest tightness, subjective wheeze or overt sinus or hb symptoms.    Also denies any obvious fluctuation of symptoms with weather or environmental changes or other aggravating or alleviating factors except as outlined above   No unusual exposure hx or h/o childhood pna/ asthma or knowledge of premature birth.  Current Allergies, Complete Past Medical History, Past Surgical History, Family History, and Social History were reviewed in [employer] record.  ROS  The following are not active complaints unless bolded Hoarseness, sore throat, dysphagia, dental problems, itching, sneezing,  nasal congestion or discharge of excess mucus or purulent secretions, ear ache,   fever, chills, sweats, unintended wt loss or wt gain, classically pleuritic or exertional cp,  orthopnea pnd or arm/hand swelling  or leg swelling, presyncope, palpitations, abdominal pain, anorexia, nausea, vomiting, diarrhea  or change in bowel habits or change in bladder habits, change in stools or change in urine, dysuria, hematuria,  rash, arthralgias, visual complaints, headache, numbness, weakness or ataxia or problems with walking or coordination,  change in mood or  memory.            Outpatient Medications Prior to Visit  Medication Sig Dispense Refill   albuterol  (VENTOLIN  HFA) 108 (90 Base) MCG/ACT inhaler Inhale 2 puffs into the lungs every 6 (six) hours as  needed for wheezing or shortness of breath. 18 g 5   ALPRAZolam  (XANAX ) 0.5 MG tablet Take 1 tablet (0.5 mg total) by mouth daily as needed for anxiety. 20 tablet 0   amLODipine  (NORVASC ) 5 MG tablet Take 1 tablet (5 mg total) by mouth daily. (Patient taking differently: Take 5 mg by mouth daily. Patient reports taking when SBP >150) 90 tablet 3   clotrimazole -betamethasone  (LOTRISONE ) cream Apply 1 Application topically 2 (two) times daily. 45 g 2   doxycycline  (VIBRAMYCIN ) 100 MG capsule Take 1 capsule (100 mg total) by mouth 2 (two) times daily. 20 capsule 0   fluticasone  furoate-vilanterol (BREO ELLIPTA ) 100-25 MCG/ACT AEPB Inhale 1 puff into the lungs daily. 60 each 8   guaiFENesin  (MUCINEX ) 600 MG 12 hr tablet Take 1 tablet (600 mg total) by mouth 2 (two) times daily. 60 tablet 0   ipratropium-albuterol  (DUONEB) 0.5-2.5 (3) MG/3ML SOLN Take 3 mLs by nebulization every 4 (four) hours as needed. Can use 4-6 times daily as needed. Max 6 per 24 hours 460 mL 3   oxyCODONE  (ROXICODONE ) 5 MG immediate release tablet Take 1-1.5 tablets (5-7.5 mg total) by mouth every 4 (four) hours as needed for severe pain (pain score 7-10). Pt plans to fill on 02/25/24 160 tablet 0   predniSONE  (DELTASONE ) 20 MG tablet Take 40 mg by mouth daily for 3- 4 days as needed for COPD exacerbation 30 tablet 0   rosuvastatin  (CRESTOR ) 10 MG tablet Take 1 tablet (10 mg total) by mouth daily. 90 tablet  3   triamcinolone  cream (KENALOG ) 0.5 % Apply topically 2 (two) times daily. Use as needed for rash on extremities 30 g 1   umeclidinium bromide  (INCRUSE ELLIPTA ) 62.5 MCG/ACT AEPB Inhale 1 puff into the lungs daily. 90 each 9   No facility-administered medications prior to visit.    Past Medical History:  Diagnosis Date   COPD (chronic obstructive pulmonary disease) (HCC)    Degenerative disc disease    reports back fx x 3.      Objective:     There were no vitals taken for this visit.         Assessment        "

## 2024-04-16 ENCOUNTER — Telehealth: Payer: Self-pay

## 2024-04-16 NOTE — Patient Outreach (Signed)
 Care Coordination   04/16/2024 Name: RIYAD KEENA MRN: [id number] DOB: Sep 15, 1960   Care Coordination Outreach Attempts:  An unsuccessful outreach was attempted for an appointment today.  Follow Up Plan:  Additional outreach attempts will be made to complete CCM follow-up visit.   Encounter Outcome:  No Answer. HIPAA compliant voicemail left requesting return call.   Rosaline Finlay, RN MSN [place]  [facility] RN Care Manager Direct Dial: [phone]  Fax: [phone]

## 2024-04-16 NOTE — Patient Instructions (Signed)
 Elsie DASEN Wingerter - I am sorry I was unable to reach you today for our scheduled appointment. I work with Copland, Harlene BROCKS, MD and am calling to support your healthcare needs. Please contact me at [phone] at your earliest convenience. I look forward to speaking with you soon.   Thank you,  Rosaline Finlay, RN MSN [place]  [facility] RN Care Manager Direct Dial: [phone]  Fax: [phone]

## 2024-04-21 NOTE — Patient Outreach (Signed)
 Care Coordination   04/21/2024 Name: NILESH STEGALL MRN: [id number] DOB: 02-21-1961   Care Coordination Outreach Attempts:  A second unsuccessful outreach was attempted today to complete CCM follow-up visit.  Follow Up Plan:  Additional outreach attempts will be made to complete follow-up visit.   Encounter Outcome:  No Answer. HIPAA compliant voicemail left requesting return call.   Rosaline Finlay, RN MSN   [facility] RN Care Manager Direct Dial: [phone]  Fax: [phone]

## 2024-04-23 NOTE — Patient Outreach (Signed)
 Care Coordination   04/23/2024 Name: Grant Galvan MRN: [id number] DOB: 1960/05/25   Care Coordination Outreach Attempts:  A third unsuccessful outreach was attempted today to complete CCM follow-up visit.  Follow Up Plan:  No further outreach attempts will be made at this time. We have been unable to contact the patient to complete follow-up visit.  Encounter Outcome:  No Answer. HIPAA compliant voicemail left requesting return call.   Rosaline Finlay, RN MSN   [facility] RN Care Manager Direct Dial: [phone]  Fax: [phone]

## 2024-04-23 NOTE — Patient Instructions (Signed)
 Elsie DASEN Celestine - I have attempted to call you three times but have been unsuccessful in reaching you. I work with Copland, Harlene BROCKS, MD and am calling to support your healthcare needs. If I can be of assistance to you, please contact me at [phone].     Thank you,  Rosaline Finlay, RN MSN [place]  [facility] RN Care Manager Direct Dial: [phone]  Fax: [phone]

## 2024-04-26 ENCOUNTER — Encounter: Payer: Self-pay | Admitting: Family Medicine

## 2024-04-26 DIAGNOSIS — J449 Chronic obstructive pulmonary disease, unspecified: Secondary | ICD-10-CM

## 2024-04-27 MED ORDER — PREDNISONE 20 MG PO TABS: ORAL_TABLET | ORAL | 0 refills | Status: AC

## 2024-04-27 NOTE — Addendum Note (Signed)
 Addended by: WATT RAISIN C on: 04/27/2024 12:34 PM   Modules accepted: Orders
